# Patient Record
Sex: Male | Born: 1965 | Race: White | Hispanic: No | Marital: Married | Smoking: Never smoker
Health system: Southern US, Community
[De-identification: ages and names within clinical notes are randomized; demographics above are authoritative.]

## PROBLEM LIST (undated history)

## (undated) DIAGNOSIS — I4891 Unspecified atrial fibrillation: Secondary | ICD-10-CM

## (undated) DIAGNOSIS — I639 Cerebral infarction, unspecified: Secondary | ICD-10-CM

## (undated) DIAGNOSIS — R011 Cardiac murmur, unspecified: Secondary | ICD-10-CM

## (undated) DIAGNOSIS — I341 Nonrheumatic mitral (valve) prolapse: Secondary | ICD-10-CM

## (undated) DIAGNOSIS — F411 Generalized anxiety disorder: Secondary | ICD-10-CM

## (undated) DIAGNOSIS — K219 Gastro-esophageal reflux disease without esophagitis: Secondary | ICD-10-CM

## (undated) DIAGNOSIS — E785 Hyperlipidemia, unspecified: Secondary | ICD-10-CM

## (undated) DIAGNOSIS — Z7901 Long term (current) use of anticoagulants: Secondary | ICD-10-CM

## (undated) DIAGNOSIS — U071 COVID-19: Secondary | ICD-10-CM

## (undated) DIAGNOSIS — G43009 Migraine without aura, not intractable, without status migrainosus: Secondary | ICD-10-CM

## (undated) DIAGNOSIS — I517 Cardiomegaly: Secondary | ICD-10-CM

## (undated) DIAGNOSIS — I509 Heart failure, unspecified: Secondary | ICD-10-CM

## (undated) DIAGNOSIS — Z95810 Presence of automatic (implantable) cardiac defibrillator: Secondary | ICD-10-CM

## (undated) DIAGNOSIS — Z9289 Personal history of other medical treatment: Secondary | ICD-10-CM

## (undated) DIAGNOSIS — Z9889 Other specified postprocedural states: Secondary | ICD-10-CM

## (undated) DIAGNOSIS — M1990 Unspecified osteoarthritis, unspecified site: Secondary | ICD-10-CM

## (undated) HISTORY — DX: Cardiomegaly: I51.7

## (undated) HISTORY — DX: Migraine without aura, not intractable, without status migrainosus: G43.009

## (undated) HISTORY — DX: Gastro-esophageal reflux disease without esophagitis: K21.9

## (undated) HISTORY — DX: COVID-19: U07.1

## (undated) HISTORY — DX: Hyperlipidemia, unspecified: E78.5

## (undated) HISTORY — PX: OTHER SURGICAL HISTORY: SHX169

## (undated) HISTORY — DX: Generalized anxiety disorder: F41.1

## (undated) HISTORY — DX: Unspecified atrial fibrillation: I48.91

## (undated) HISTORY — DX: Long term (current) use of anticoagulants: Z79.01

## (undated) HISTORY — DX: Other specified postprocedural states: Z98.890

## (undated) HISTORY — DX: Nonrheumatic mitral (valve) prolapse: I34.1

## (undated) HISTORY — PX: KNEE SURGERY: SHX244

## (undated) HISTORY — DX: Personal history of other medical treatment: Z92.89

## (undated) HISTORY — DX: Cerebral infarction, unspecified: I63.9

---

## 2003-07-04 ENCOUNTER — Emergency Department (HOSPITAL_COMMUNITY): Admission: EM | Admit: 2003-07-04 | Discharge: 2003-07-04 | Payer: Self-pay | Admitting: Emergency Medicine

## 2003-07-04 ENCOUNTER — Encounter: Payer: Self-pay | Admitting: Emergency Medicine

## 2005-08-17 HISTORY — PX: US ECHOCARDIOGRAPHY: HXRAD669

## 2007-10-22 ENCOUNTER — Ambulatory Visit: Payer: Self-pay | Admitting: Family Medicine

## 2007-10-22 DIAGNOSIS — E785 Hyperlipidemia, unspecified: Secondary | ICD-10-CM

## 2007-10-22 DIAGNOSIS — I059 Rheumatic mitral valve disease, unspecified: Secondary | ICD-10-CM | POA: Insufficient documentation

## 2007-10-22 HISTORY — DX: Hyperlipidemia, unspecified: E78.5

## 2007-12-24 ENCOUNTER — Ambulatory Visit: Payer: Self-pay | Admitting: Family Medicine

## 2007-12-24 LAB — CONVERTED CEMR LAB
Blood in Urine, dipstick: NEGATIVE
Glucose, Urine, Semiquant: 100
Ketones, urine, test strip: NEGATIVE
Protein, U semiquant: NEGATIVE
Specific Gravity, Urine: 1.02
Urobilinogen, UA: 0.2
WBC Urine, dipstick: NEGATIVE
pH: 7

## 2007-12-29 LAB — CONVERTED CEMR LAB
ALT: 27 units/L (ref 0–53)
Albumin: 4 g/dL (ref 3.5–5.2)
Alkaline Phosphatase: 60 units/L (ref 39–117)
BUN: 15 mg/dL (ref 6–23)
Basophils Absolute: 0 10*3/uL (ref 0.0–0.1)
Bilirubin, Direct: 0.2 mg/dL (ref 0.0–0.3)
CO2: 28 meq/L (ref 19–32)
Calcium: 9.9 mg/dL (ref 8.4–10.5)
Eosinophils Absolute: 0.1 10*3/uL (ref 0.0–0.6)
Monocytes Absolute: 0.4 10*3/uL (ref 0.2–0.7)
Neutrophils Relative %: 48.2 % (ref 43.0–77.0)
RDW: 12.4 % (ref 11.5–14.6)
Total Bilirubin: 1.2 mg/dL (ref 0.3–1.2)
Total CHOL/HDL Ratio: 2.9
Total Protein: 6.5 g/dL (ref 6.0–8.3)

## 2008-01-06 ENCOUNTER — Ambulatory Visit: Payer: Self-pay | Admitting: Family Medicine

## 2008-01-06 DIAGNOSIS — I4891 Unspecified atrial fibrillation: Secondary | ICD-10-CM

## 2008-09-01 HISTORY — PX: US ECHOCARDIOGRAPHY: HXRAD669

## 2008-11-08 ENCOUNTER — Encounter: Payer: Self-pay | Admitting: Family Medicine

## 2010-09-01 ENCOUNTER — Ambulatory Visit: Payer: Self-pay | Admitting: Cardiovascular Disease

## 2010-09-05 ENCOUNTER — Ambulatory Visit: Payer: Self-pay | Admitting: Cardiovascular Disease

## 2011-09-27 ENCOUNTER — Encounter: Payer: Self-pay | Admitting: Cardiovascular Disease

## 2011-10-04 ENCOUNTER — Encounter: Payer: Self-pay | Admitting: Cardiovascular Disease

## 2011-10-04 ENCOUNTER — Ambulatory Visit (INDEPENDENT_AMBULATORY_CARE_PROVIDER_SITE_OTHER): Payer: Self-pay | Admitting: Cardiovascular Disease

## 2011-10-04 ENCOUNTER — Ambulatory Visit (INDEPENDENT_AMBULATORY_CARE_PROVIDER_SITE_OTHER): Payer: BC Managed Care – PPO | Admitting: *Deleted

## 2011-10-04 VITALS — BP 118/80 | HR 60 | Ht 74.0 in | Wt 220.0 lb

## 2011-10-04 DIAGNOSIS — E785 Hyperlipidemia, unspecified: Secondary | ICD-10-CM

## 2011-10-04 DIAGNOSIS — I4891 Unspecified atrial fibrillation: Secondary | ICD-10-CM

## 2011-10-04 LAB — LIPID PANEL
Cholesterol: 172 mg/dL (ref 0–200)
Total CHOL/HDL Ratio: 2
Triglycerides: 58 mg/dL (ref 0.0–149.0)

## 2011-10-04 LAB — BASIC METABOLIC PANEL
CO2: 27 mEq/L (ref 19–32)
Calcium: 9.2 mg/dL (ref 8.4–10.5)
Creatinine, Ser: 1.2 mg/dL (ref 0.4–1.5)

## 2011-10-04 LAB — HEPATIC FUNCTION PANEL
ALT: 21 U/L (ref 0–53)
AST: 24 U/L (ref 0–37)
Albumin: 4.6 g/dL (ref 3.5–5.2)
Alkaline Phosphatase: 54 U/L (ref 39–117)
Total Protein: 7.2 g/dL (ref 6.0–8.3)

## 2011-10-04 NOTE — Assessment & Plan Note (Signed)
Dennis Cortez is doing fairly well. He remains in atrial fibrillation. For now we'll continue him on aspirin.  We will discussion regarding starting additional agents at this point he is completely asymptomatic and is able to do all of his normal activities without any significant problems.  I told him that we certainly could be more aggressive about and start him on anticoagulants and a cardioversion. He would also likely need to start antiarrhythmic medicines.  At this point I don't think that we can make him feel any better. The antiarrhythmic medicines are likely to make his heart rate go slower. His left ventricular systolic function has been normal. I'll see him again in one year for followup visit, fasting lipid profile, basic metabolic profile, HFP, and EKG.

## 2011-10-04 NOTE — Patient Instructions (Signed)
Your physician wants you to follow-up in: 1 year  You will receive a reminder letter in the mail two months in advance. If you don't receive a letter, please call our office to schedule the follow-up appointment.  Your physician recommends that you continue on your current medications as directed. Please refer to the Current Medication list given to you today.  

## 2011-10-04 NOTE — Progress Notes (Signed)
Dennis Cortez Date of Birth  03/23/1966 [facility] [address]    [address] [place], [place]  [address] [phone]  Fax  [phone]  History of Present Illness:    Current Outpatient Prescriptions on File Prior to Visit  Medication Sig Dispense Refill  . aspirin 325 MG tablet Take 325 mg by mouth daily.        . Cholecalciferol (VITAMIN D PO) Take by mouth daily.        . fish oil-omega-3 fatty acids 1000 MG capsule Take 2 g by mouth daily.        . Glucosamine-Chondroit-Vit C-Mn (GLUCOSAMINE CHONDR 1500 COMPLX PO) Take by mouth 2 (two) times daily.        . Multiple Vitamin (MULTIVITAMIN) tablet Take 1 tablet by mouth daily.        . rosuvastatin (CRESTOR) 10 MG tablet Take 10 mg by mouth daily.          No Known Allergies  Past Medical History  Diagnosis Date  . Atrial fibrillation   . MVP (mitral valve prolapse)   . Hyperlipidemia   . LVH (left ventricular hypertrophy)     Past Surgical History  Procedure Date  . Knee surgery   . US echocardiography 09/01/2008    EF 55-60%  . US echocardiography 08/17/2005    EF 55-60%    History  Smoking status  . Never Smoker   Smokeless tobacco  . Not on file    History  Alcohol Use No    History reviewed. No pertinent family history.  Reviw of Systems:  Reviewed in the HPI.  All other systems are negative.  Physical Exam: BP 118/80  Pulse 60  Ht 6\' 2"  (1.88 m)  Wt 220 lb (99.791 kg)  BMI 28.25 kg/m2 The patient is alert and oriented x 3.  The mood and affect are normal.   Skin: warm and dry.  Color is normal.    HEENT:   Normal carotids, no JVD  Lungs: clear   Heart: Irregular Irregular    Abdomen: + BS, non tender  Extremities:  No C/C/E  Neuro:  Non focal     ECG: Atrial fibrillation with a controlled ventricular response  Assessment / Plan:

## 2011-10-09 ENCOUNTER — Telehealth: Payer: Self-pay | Admitting: *Deleted

## 2011-10-09 NOTE — Progress Notes (Signed)
Mailed copy of labs, left message with wife if any questions to call

## 2011-10-09 NOTE — Telephone Encounter (Signed)
Message copied by Burnell Blanks on Tue Oct 09, 2011  9:27 AM ------      Message from: [place], [place] J      Created: Thu Oct 04, 2011  4:28 PM       These look good

## 2011-10-09 NOTE — Telephone Encounter (Signed)
Mailed copy of labs. Left message with wife if any questions to call

## 2011-10-30 ENCOUNTER — Other Ambulatory Visit: Payer: Self-pay | Admitting: *Deleted

## 2011-10-30 MED ORDER — ROSUVASTATIN CALCIUM 10 MG PO TABS: 10.0000 mg | ORAL_TABLET | Freq: Every day | ORAL | Status: DC

## 2011-10-30 NOTE — Telephone Encounter (Signed)
Fax Received. Refill Completed. Dexton Zwilling Chowoe (M.A)  

## 2012-05-09 ENCOUNTER — Other Ambulatory Visit: Payer: Self-pay | Admitting: *Deleted

## 2012-05-09 MED ORDER — ROSUVASTATIN CALCIUM 10 MG PO TABS: 10.0000 mg | ORAL_TABLET | Freq: Every day | ORAL | Status: DC

## 2012-05-09 NOTE — Telephone Encounter (Signed)
Fax Received. Refill Completed. Dnya Hickle Chowoe (R.M.A)   

## 2012-05-09 NOTE — Telephone Encounter (Signed)
Opened in Error.

## 2012-05-10 ENCOUNTER — Other Ambulatory Visit: Payer: Self-pay | Admitting: *Deleted

## 2012-05-10 MED ORDER — ROSUVASTATIN CALCIUM 10 MG PO TABS: 10.0000 mg | ORAL_TABLET | Freq: Every day | ORAL | Status: DC

## 2012-05-10 NOTE — Telephone Encounter (Signed)
Opened in Error.

## 2012-09-01 ENCOUNTER — Ambulatory Visit (INDEPENDENT_AMBULATORY_CARE_PROVIDER_SITE_OTHER): Payer: BC Managed Care – PPO | Admitting: Family Medicine

## 2012-09-01 ENCOUNTER — Encounter: Payer: Self-pay | Admitting: Family Medicine

## 2012-09-01 VITALS — BP 110/70 | Temp 98.9°F | Ht 74.0 in | Wt 218.0 lb

## 2012-09-01 DIAGNOSIS — E785 Hyperlipidemia, unspecified: Secondary | ICD-10-CM

## 2012-09-01 DIAGNOSIS — I4891 Unspecified atrial fibrillation: Secondary | ICD-10-CM

## 2012-09-01 NOTE — Progress Notes (Addendum)
Chief Complaint  Patient presents with  . Establish Care    HPI:  Mr. Zajkowski is here to establish care. He is follow by cardiology for his atrial fib and lipids.   Atrial. Fib: -followed by cards -on ASA daily -denies: CP, SOB, palpitations, dizziness  Hyperlipidemia: -doing well on statin -just had labs checked - he will send [place] a copy of these -tries to eat healthy -exercising, but has difficulty with cardio due to knee and shoulder issues -denies leg cramping  Preventive: Alcohol Use: -drinks about 12 beers on the weekend -no drinking during the week -adopted - so had colonoscopy in the past and normal -UTD on tetanus -Had PSA in the past several times and normal -does not want flu vaccine  Addendum: patient faxed lab results from recent health [facility]. Total chol 164 HDL 75 Trig 39 LDL 78 Fasting Glu 80   ROS: See pertinent positives and negatives per HPI.  Past Medical History  Diagnosis Date  . Atrial fibrillation   . MVP (mitral valve prolapse)   . Hyperlipidemia   . LVH (left ventricular hypertrophy)     No family history on file.  History   Social History  . Marital Status: Married    Spouse Name: N/A    Number of Children: N/A  . Years of Education: N/A   Social History Main Topics  . Smoking status: Never Smoker   . Smokeless tobacco: None  . Alcohol Use: No  . Drug Use: No  . Sexually Active:    Other Topics Concern  . None   Social History Narrative  . None    Current outpatient prescriptions:aspirin 325 MG tablet, Take 325 mg by mouth daily.  , Disp: , Rfl: ;  Cholecalciferol (VITAMIN D PO), Take by mouth daily.  , Disp: , Rfl: ;  fish oil-omega-3 fatty acids 1000 MG capsule, Take 2 g by mouth daily.  , Disp: , Rfl: ;  Glucosamine-Chondroit-Vit C-Mn (GLUCOSAMINE CHONDR 1500 COMPLX PO), Take by mouth 2 (two) times daily.  , Disp: , Rfl:  Multiple Vitamin (MULTIVITAMIN) tablet, Take 1 tablet by mouth daily.  , Disp: , Rfl: ;   rosuvastatin (CRESTOR) 10 MG tablet, Take 1 tablet (10 mg total) by mouth daily., Disp: 30 tablet, Rfl: 5  EXAM:  Filed Vitals:   09/01/12 0802  BP: 110/70  Temp: 98.9 F (37.2 C)    Body mass index is 27.99 kg/(m^2).  GENERAL: vitals reviewed and listed below, alert, oriented, appears well hydrated and in no acute distress  HEENT: atraumatic, conjucntiva clear, no obvious abnormalities on inspection of external nose and ears  NECK: no masses on inspection  LUNGS: clear to auscultation bilaterally, no wheezes, rales or rhonchi, good air movement  CV: irr irr, no peripheral edema  MS: moves all extremities without noticeable abnormality  PSYCH: pleasant and cooperative, no obvious depression or anxiety  ASSESSMENT AND PLAN:  Discussed the following assessment and plan:   Preventive 1. HYPERLIPIDEMIA   2. ATRIAL FIBRILLATION WITH SLOW VENTRICULAR RESPONSE    -offered flu - refused -will review labs once received -discussed UPSTF preventive recommendations for age. -advised to cut back on drinking and counseling provided -advised healthy diet and regular low impact exercise such as biking, ellipticle, swimming -follow up yearly for physical or in meantime if any concerns  No orders of the defined types were placed in this encounter.    There are no Patient Instructions on file for this visit.  Return to  clinic immediately if symptoms worsen or persist or new concerns.  No Follow-up on file.  Kriste Basque R.

## 2012-09-04 ENCOUNTER — Other Ambulatory Visit (INDEPENDENT_AMBULATORY_CARE_PROVIDER_SITE_OTHER): Payer: BC Managed Care – PPO

## 2012-09-04 DIAGNOSIS — E785 Hyperlipidemia, unspecified: Secondary | ICD-10-CM

## 2012-09-04 LAB — BASIC METABOLIC PANEL
BUN: 15 mg/dL (ref 6–23)
Chloride: 104 mEq/L (ref 96–112)
GFR: 83.92 mL/min (ref >60.00)
Glucose, Bld: 89 mg/dL (ref 70–99)
Potassium: 4.5 mEq/L (ref 3.5–5.1)
Sodium: 137 mEq/L (ref 135–145)

## 2012-09-04 LAB — HEPATIC FUNCTION PANEL
ALT: 28 U/L (ref 0–53)
AST: 27 U/L (ref 0–37)
Albumin: 4.2 g/dL (ref 3.5–5.2)
Alkaline Phosphatase: 49 U/L (ref 39–117)

## 2012-09-04 LAB — LIPID PANEL
Cholesterol: 165 mg/dL (ref 0–200)
VLDL: 9.8 mg/dL (ref 0.0–40.0)

## 2012-09-11 ENCOUNTER — Encounter: Payer: Self-pay | Admitting: Cardiovascular Disease

## 2012-09-11 ENCOUNTER — Ambulatory Visit (INDEPENDENT_AMBULATORY_CARE_PROVIDER_SITE_OTHER): Payer: BC Managed Care – PPO | Admitting: Cardiovascular Disease

## 2012-09-11 VITALS — BP 120/78 | HR 63 | Ht 74.0 in | Wt 214.4 lb

## 2012-09-11 DIAGNOSIS — I4891 Unspecified atrial fibrillation: Secondary | ICD-10-CM

## 2012-09-11 DIAGNOSIS — E785 Hyperlipidemia, unspecified: Secondary | ICD-10-CM

## 2012-09-11 MED ORDER — ROSUVASTATIN CALCIUM 10 MG PO TABS: 10.0000 mg | ORAL_TABLET | Freq: Every day | ORAL | Status: DC

## 2012-09-11 NOTE — Patient Instructions (Addendum)
Your physician wants you to follow-up in: 1 year  You will receive a reminder letter in the mail two months in advance. If you don't receive a letter, please call our office to schedule the follow-up appointment.   Your physician recommends that you return for a FASTING lipid profile: 1 year   

## 2012-09-11 NOTE — Progress Notes (Signed)
    Dennis Cortez Date of Birth  Feb 11, 1966       [facility]    [facility] [address]  [address] [place], [place]  [address]   [place], [place]  [address] [phone]     [phone]   Fax  [phone]    Fax [phone]  Problem List: 1. Atrial Fibrillation 2. Hyperlipidemia  History of Present Illness:  Dennis Cortez is doing well.  Asymptomatic.  His last echo was 3 years ago.  He is working out at [employer] on a regular basis.  He has never had any symptoms related to his A-Fib.  His CHADS score is 0  Current Outpatient Prescriptions on File Prior to Visit  Medication Sig Dispense Refill  . aspirin 325 MG tablet Take 325 mg by mouth daily.        . fish oil-omega-3 fatty acids 1000 MG capsule Take 2 g by mouth daily.        . Glucosamine-Chondroit-Vit C-Mn (GLUCOSAMINE CHONDR 1500 COMPLX PO) Take by mouth 2 (two) times daily.        . Multiple Vitamin (MULTIVITAMIN) tablet Take 1 tablet by mouth daily.        . rosuvastatin (CRESTOR) 10 MG tablet Take 1 tablet (10 mg total) by mouth daily.  30 tablet  5    No Known Allergies  Past Medical History  Diagnosis Date  . Atrial fibrillation   . MVP (mitral valve prolapse)   . Hyperlipidemia   . LVH (left ventricular hypertrophy)     Past Surgical History  Procedure Date  . Knee surgery   . US echocardiography 09/01/2008    EF 55-60%  . US echocardiography 08/17/2005    EF 55-60%    History  Smoking status  . Never Smoker   Smokeless tobacco  . Not on file    History  Alcohol Use No    No family history on file.  Reviw of Systems:  Reviewed in the HPI.  All other systems are negative.  Physical Exam: Blood pressure 120/78, pulse 63, height 6\' 2"  (1.88 m), weight 214 lb 6.4 oz (97.251 kg). General: Well developed, well nourished, in no acute distress.  Head: Normocephalic, atraumatic, sclera non-icteric, mucus membranes are moist,   Neck: Supple. Carotids are 2 +  without bruits. No JVD  Lungs: Clear bilaterally to auscultation.  Heart: irregularly irregular.  normal  S1 S2. No significant  murmurs, gallops or rubs.  Abdomen: Soft, non-tender, non-distended with normal bowel sounds. No hepatomegaly. No rebound/guarding. No masses.  Msk:  Strength and tone are normal  Extremities: No clubbing or cyanosis. No edema.  Distal pedal pulses are 2+ and equal bilaterally.  Neuro: Alert and oriented X 3. Moves all extremities spontaneously.  Psych:  Responds to questions appropriately with a normal affect.  ECG: Oct. 3, 2013- Atrial fibrillation at 54.  NS ST T wave changes  Assessment / Plan:

## 2012-09-11 NOTE — Assessment & Plan Note (Signed)
Dennis Cortez is doing very well.  He continues to be asymptomatic.  He has never had any cp or dyspnea. Will see him in a year.  I anticipate doing an echo next year.  He has a hx of MVP but has not had any symptoms and does not have a loud murmur.

## 2012-09-11 NOTE — Assessment & Plan Note (Signed)
His lipid levels have been well controlled.  Continue current dose of crestor.  Will check fasting labs again in 1 year at his ov.

## 2012-09-16 ENCOUNTER — Encounter: Payer: Self-pay | Admitting: Cardiovascular Disease

## 2012-11-03 ENCOUNTER — Other Ambulatory Visit: Payer: Self-pay | Admitting: *Deleted

## 2012-11-03 NOTE — Telephone Encounter (Signed)
Opened in Error.

## 2012-11-05 ENCOUNTER — Other Ambulatory Visit: Payer: Self-pay | Admitting: Cardiovascular Disease

## 2012-11-05 DIAGNOSIS — E785 Hyperlipidemia, unspecified: Secondary | ICD-10-CM

## 2012-11-05 MED ORDER — ROSUVASTATIN CALCIUM 10 MG PO TABS: 10.0000 mg | ORAL_TABLET | Freq: Every day | ORAL | Status: DC

## 2012-12-15 ENCOUNTER — Telehealth: Payer: Self-pay | Admitting: Family Medicine

## 2012-12-15 DIAGNOSIS — E785 Hyperlipidemia, unspecified: Secondary | ICD-10-CM

## 2012-12-15 MED ORDER — ROSUVASTATIN CALCIUM 10 MG PO TABS: 10.0000 mg | ORAL_TABLET | Freq: Every day | ORAL | Status: DC

## 2012-12-15 NOTE — Telephone Encounter (Signed)
Rx sent to pharmacy   

## 2012-12-15 NOTE — Telephone Encounter (Signed)
Patient's spouse called stating that they are changing to 90 day refills per insurance and will need to have crestor 10 1poqd called [facility] [address] for 90 day refill. Please assist.

## 2013-02-07 DIAGNOSIS — Z9289 Personal history of other medical treatment: Secondary | ICD-10-CM

## 2013-02-07 HISTORY — DX: Personal history of other medical treatment: Z92.89

## 2013-02-17 ENCOUNTER — Encounter: Payer: Self-pay | Admitting: Physician Assistant

## 2013-02-17 ENCOUNTER — Ambulatory Visit (INDEPENDENT_AMBULATORY_CARE_PROVIDER_SITE_OTHER): Payer: Managed Care, Other (non HMO) | Admitting: Physician Assistant

## 2013-02-17 ENCOUNTER — Telehealth: Payer: Self-pay | Admitting: *Deleted

## 2013-02-17 VITALS — BP 106/82 | HR 56 | Ht 74.0 in | Wt 224.0 lb

## 2013-02-17 DIAGNOSIS — R5381 Other malaise: Secondary | ICD-10-CM

## 2013-02-17 DIAGNOSIS — R079 Chest pain, unspecified: Secondary | ICD-10-CM

## 2013-02-17 DIAGNOSIS — K219 Gastro-esophageal reflux disease without esophagitis: Secondary | ICD-10-CM

## 2013-02-17 DIAGNOSIS — R0602 Shortness of breath: Secondary | ICD-10-CM

## 2013-02-17 DIAGNOSIS — I4891 Unspecified atrial fibrillation: Secondary | ICD-10-CM

## 2013-02-17 DIAGNOSIS — R5383 Other fatigue: Secondary | ICD-10-CM

## 2013-02-17 MED ORDER — OMEPRAZOLE 20 MG PO CPDR: 20.0000 mg | DELAYED_RELEASE_CAPSULE | Freq: Every day | ORAL | Status: DC

## 2013-02-17 NOTE — Progress Notes (Signed)
[address], [address] [place], [place]  [address] Phone: [phone], Fax:  [phone]  Date:  02/17/2013   ID:  Dennis Cortez, DOB May 20, 1966, MRN [id number]  PCP:  Terressa Koyanagi., DO  Primary Cardiologist:  Dr. Delane Ginger     History of Present Illness: Ladonte Verstraete is a 47 y.o. male who returns for evaluation of chest pain.  He has a history of atrial fibrillation, HL, mitral valve prolapse. CHADS2=0.  He is on ASA only.  Echo 9/09: EF 55-60%, mild LVH, mild BAE. ETT-echo 9/09: Normal. Last seen by Dr. Elease Hashimoto 10/13.  Plan was to get a followup echo at his next visit. Over the last couple of months, he has noted progressively worsening left-sided chest discomfort that he describes as sharp. This typically occurs with stress. He has decreased his exercise over the last year. However, when he exercises on the weekend, he denies exertional chest pain. He denies significant dyspnea. He does feel like he is breathing harder when he has chest pain. He denies syncope. He denies associated nausea, diaphoresis, jaw pain. He denies orthopnea, PND or edema. He does have a history of shoulder surgery on the left. His left shoulder and arm has been bothering him more. This is not clearly associated with his chest pain.  Wt Readings from Last 3 Encounters:  02/17/13 224 lb (101.606 kg)  09/11/12 214 lb 6.4 oz (97.251 kg)  09/01/12 218 lb (98.884 kg)     Past Medical History  Diagnosis Date  . Atrial fibrillation   . MVP (mitral valve prolapse)   . Hyperlipidemia   . LVH (left ventricular hypertrophy)     Current Outpatient Prescriptions  Medication Sig Dispense Refill  . aspirin 325 MG tablet Take 325 mg by mouth daily.        . fish oil-omega-3 fatty acids 1000 MG capsule Take 2 g by mouth daily.        . Glucosamine-Chondroit-Vit C-Mn (GLUCOSAMINE CHONDR 1500 COMPLX PO) Take by mouth 2 (two) times daily.        . Multiple Vitamin (MULTIVITAMIN) tablet Take 1 tablet by  mouth daily.        . Probiotic Product (PROBIOTIC DAILY PO) Take by mouth.      . rosuvastatin (CRESTOR) 10 MG tablet Take 1 tablet (10 mg total) by mouth daily.  90 tablet  1   No current facility-administered medications for this visit.    Allergies:   No Known Allergies  Social History:  The patient  reports that he has never smoked. He does not have any smokeless tobacco history on file. He reports that he does not drink alcohol or use illicit drugs.   ROS:  Please see the history of present illness.   He does have daily indigestion. He denies chest pain associated with meals. He does note some mild dysphagia. He denies melena or significant hematochezia. He has a nonproductive cough.   All other systems reviewed and negative.   PHYSICAL EXAM: VS:  BP 106/82  Pulse 56  Ht 6\' 2"  (1.88 m)  Wt 224 lb (101.606 kg)  BMI 28.75 kg/m2 Well nourished, well developed, in no acute distress HEENT: normal Neck: no JVD Vascular: No carotid bruits Cardiac:  normal S1, S2; irregularly irregular rhythm; no murmur Lungs:  clear to auscultation bilaterally, no wheezing, rhonchi or rales Abd: soft, nontender, no hepatomegaly Ext: no edema Skin: warm and dry MSK: Mild pain with internal rotation of left upper extremity;  no crepitus noted in the left shoulder Neuro:  CNs 2-12 intact, no focal abnormalities noted  EKG:  Atrial fibrillation, HR 56, normal axis, poor R wave progression, diffuse ST abnormality, no significant change when compared to prior tracings     ASSESSMENT AND PLAN:  1. Chest Pain:  Etiology unclear. His symptom complex is likely multifactorial and related to a combination of GERD, emotional stress, left shoulder arthropathy and deconditioning. I will place him on Prilosec 20 mg daily. He should follow up with his primary care physician. If his dysphagia continues, he should seek referral to gastroenterology. Check a CBC, basic metabolic panel and TSH. Proceed with follow up  echocardiogram as previously planned as well as an ETT-echocardiogram. If his stress test is normal, he should increase activity to his previous level. 2. Atrial Fibrillation: Rate controlled. CHADS2 score 0 he continues on aspirin. 3. Hyperlipidemia: Continue statin. 4. GERD: Start Prilosec and follow up with PCP. 5. Disposition:  Follow up with Dr. Elease Hashimoto in 6 weeks.  Sloane, Junkin, PA-C  3:29 PM 02/17/2013

## 2013-02-17 NOTE — Telephone Encounter (Signed)
Called patient who c/o SOB with exertion; L arm pain that radiates to neck; s/s off and on x 1 month; no worsening with exercise; denies n/v, diaphoresis; hx chronic afib; no vitals taken recently; appointment scheduled with Tereso Newcomer for 2:40 today.

## 2013-02-17 NOTE — Telephone Encounter (Signed)
Pt wants to be seen today, chest pain, shoulder pain, no energy about 2 months, getting a little worse

## 2013-02-17 NOTE — Patient Instructions (Addendum)
START PRILOSEC 20 MG DAILY MAKE SURE TO FOLLOW UP UP WITH YOUR PRIMARY CARE PHYSICIAN ABOUT YOUR GERD  LAB TODAY; BMET. CBC W/DIFF, TSH  Your physician has requested that you have an echocardiogram. Echocardiography is a painless test that uses sound waves to create images of your heart. It provides your doctor with information about the size and shape of your heart and how well your heart's chambers and valves are working. This procedure takes approximately one hour. There are no restrictions for this procedure.  Your physician has requested that you have a stress echocardiogram. For further information please visit [email or web address] Please follow instruction sheet as given.  PLEASE FOLLOW UP WITH DR. Elease Hashimoto IN THE NEXT 4-6 WEEKS

## 2013-02-18 ENCOUNTER — Telehealth: Payer: Self-pay | Admitting: *Deleted

## 2013-02-18 LAB — CBC WITH DIFFERENTIAL/PLATELET
Basophils Relative: 0.7 % (ref 0.0–3.0)
Eosinophils Relative: 3 % (ref 0.0–5.0)
HCT: 42.4 % (ref 39.0–52.0)
Hemoglobin: 14.3 g/dL (ref 13.0–17.0)
Lymphs Abs: 2 10*3/uL (ref 0.7–4.0)
MCV: 95.6 fl (ref 78.0–100.0)
Monocytes Absolute: 0.4 10*3/uL (ref 0.1–1.0)
Neutrophils Relative %: 54.4 % (ref 43.0–77.0)
RBC: 4.43 Mil/uL (ref 4.22–5.81)
WBC: 5.7 10*3/uL (ref 4.5–10.5)

## 2013-02-18 LAB — BASIC METABOLIC PANEL
Chloride: 104 mEq/L (ref 96–112)
Potassium: 3.7 mEq/L (ref 3.5–5.1)

## 2013-02-18 NOTE — Telephone Encounter (Signed)
pt's wife notified about lab results with verbal understanding 

## 2013-02-18 NOTE — Telephone Encounter (Signed)
Message copied by Tarri Fuller on Wed Feb 18, 2013  4:24 PM ------      Message from: [place], [place] T      Created: Wed Feb 18, 2013  2:03 PM       All labs ok.      Continue with current treatment plan.      Tereso Newcomer, PA-C  2:03 PM 02/18/2013 ------

## 2013-02-23 ENCOUNTER — Encounter: Payer: Self-pay | Admitting: Physician Assistant

## 2013-02-23 ENCOUNTER — Ambulatory Visit (HOSPITAL_COMMUNITY): Payer: Managed Care, Other (non HMO) | Attending: Cardiology | Admitting: Radiology

## 2013-02-23 ENCOUNTER — Ambulatory Visit (HOSPITAL_COMMUNITY): Payer: Managed Care, Other (non HMO) | Attending: Cardiology

## 2013-02-23 DIAGNOSIS — I4891 Unspecified atrial fibrillation: Secondary | ICD-10-CM | POA: Insufficient documentation

## 2013-02-23 DIAGNOSIS — R9431 Abnormal electrocardiogram [ECG] [EKG]: Secondary | ICD-10-CM | POA: Insufficient documentation

## 2013-02-23 DIAGNOSIS — R0602 Shortness of breath: Secondary | ICD-10-CM

## 2013-02-23 DIAGNOSIS — R5383 Other fatigue: Secondary | ICD-10-CM

## 2013-02-23 NOTE — Progress Notes (Signed)
Echocardiogram performed.  

## 2013-02-24 ENCOUNTER — Ambulatory Visit (HOSPITAL_COMMUNITY): Payer: Managed Care, Other (non HMO) | Attending: Cardiology

## 2013-02-24 ENCOUNTER — Other Ambulatory Visit: Payer: Self-pay

## 2013-02-24 ENCOUNTER — Telehealth: Payer: Self-pay | Admitting: Physician Assistant

## 2013-02-24 DIAGNOSIS — I379 Nonrheumatic pulmonary valve disorder, unspecified: Secondary | ICD-10-CM | POA: Insufficient documentation

## 2013-02-24 DIAGNOSIS — R079 Chest pain, unspecified: Secondary | ICD-10-CM

## 2013-02-24 DIAGNOSIS — R072 Precordial pain: Secondary | ICD-10-CM | POA: Insufficient documentation

## 2013-02-24 DIAGNOSIS — R0989 Other specified symptoms and signs involving the circulatory and respiratory systems: Secondary | ICD-10-CM | POA: Insufficient documentation

## 2013-02-24 DIAGNOSIS — I059 Rheumatic mitral valve disease, unspecified: Secondary | ICD-10-CM | POA: Insufficient documentation

## 2013-02-24 DIAGNOSIS — I079 Rheumatic tricuspid valve disease, unspecified: Secondary | ICD-10-CM | POA: Insufficient documentation

## 2013-02-24 DIAGNOSIS — R5383 Other fatigue: Secondary | ICD-10-CM

## 2013-02-24 DIAGNOSIS — R0609 Other forms of dyspnea: Secondary | ICD-10-CM | POA: Insufficient documentation

## 2013-02-24 DIAGNOSIS — R0602 Shortness of breath: Secondary | ICD-10-CM

## 2013-02-24 NOTE — Telephone Encounter (Signed)
PT RT CALL TO [facility] RE ECHO RESULTS, PLS CALL [phone]

## 2013-02-24 NOTE — Telephone Encounter (Signed)
Will forward to [employer]. PA

## 2013-02-24 NOTE — Progress Notes (Signed)
Echocardiogram performed.  

## 2013-02-25 ENCOUNTER — Encounter: Payer: Self-pay | Admitting: Physician Assistant

## 2013-02-25 ENCOUNTER — Telehealth: Payer: Self-pay | Admitting: *Deleted

## 2013-02-25 NOTE — Telephone Encounter (Signed)
lmom echo normal

## 2013-02-25 NOTE — Telephone Encounter (Signed)
Message copied by Tarri Fuller on Wed Feb 25, 2013 10:35 AM ------      Message from: [place], [place] T      Created: Wed Feb 25, 2013  8:50 AM       Echo ok with      Normal LV function.      Tereso Newcomer, PA-C 02/25/2013 8:50 AM ------

## 2013-02-26 NOTE — Telephone Encounter (Signed)
Discussed results of ETT-echo with Loren Racer, PA-C  8:37 AM 02/26/2013

## 2013-02-27 ENCOUNTER — Ambulatory Visit: Payer: Managed Care, Other (non HMO) | Admitting: Physician Assistant

## 2013-02-27 ENCOUNTER — Encounter: Payer: Self-pay | Admitting: Physician Assistant

## 2013-03-04 ENCOUNTER — Encounter: Payer: Self-pay | Admitting: Physician Assistant

## 2013-03-04 ENCOUNTER — Ambulatory Visit (INDEPENDENT_AMBULATORY_CARE_PROVIDER_SITE_OTHER): Payer: Managed Care, Other (non HMO) | Admitting: Physician Assistant

## 2013-03-04 ENCOUNTER — Ambulatory Visit
Admission: RE | Admit: 2013-03-04 | Discharge: 2013-03-04 | Disposition: A | Discharge status: Home / Self Care | Payer: Managed Care, Other (non HMO) | Source: Ambulatory Visit | Attending: Physician Assistant | Admitting: Physician Assistant

## 2013-03-04 VITALS — BP 100/82 | HR 54 | Ht 74.0 in | Wt 222.4 lb

## 2013-03-04 DIAGNOSIS — Z01810 Encounter for preprocedural cardiovascular examination: Secondary | ICD-10-CM

## 2013-03-04 DIAGNOSIS — R9439 Abnormal result of other cardiovascular function study: Secondary | ICD-10-CM

## 2013-03-04 DIAGNOSIS — I4891 Unspecified atrial fibrillation: Secondary | ICD-10-CM

## 2013-03-04 DIAGNOSIS — R079 Chest pain, unspecified: Secondary | ICD-10-CM

## 2013-03-04 LAB — CBC WITH DIFFERENTIAL/PLATELET
Basophils Absolute: 0 10*3/uL (ref 0.0–0.1)
Eosinophils Absolute: 0.1 10*3/uL (ref 0.0–0.7)
Hemoglobin: 14.5 g/dL (ref 13.0–17.0)
Lymphocytes Relative: 38.3 % (ref 12.0–46.0)
MCHC: 34 g/dL (ref 30.0–36.0)
Monocytes Relative: 10.7 % (ref 3.0–12.0)
Neutro Abs: 2.1 10*3/uL (ref 1.4–7.7)
Neutrophils Relative %: 48.1 % (ref 43.0–77.0)
RBC: 4.47 Mil/uL (ref 4.22–5.81)
RDW: 13.5 % (ref 11.5–14.6)

## 2013-03-04 LAB — BASIC METABOLIC PANEL
BUN: 12 mg/dL (ref 6–23)
Chloride: 102 mEq/L (ref 96–112)
GFR: 82.8 mL/min (ref >60.00)
Potassium: 4.3 mEq/L (ref 3.5–5.1)

## 2013-03-04 LAB — PROTIME-INR
INR: 1 ratio (ref 0.8–1.0)
Prothrombin Time: 11 s (ref 10.2–12.4)

## 2013-03-04 NOTE — Patient Instructions (Addendum)
A chest x-ray takes a picture of the organs and structures inside the chest, including the heart, lungs, and blood vessels. This test can show several things, including, whether the heart is enlarges; whether fluid is building up in the lungs; and whether pacemaker / defibrillator leads are still in place.  Your physician recommends that you return for lab work in: today  Your physician recommends that you schedule a follow-up appointment in: after cath

## 2013-03-04 NOTE — Progress Notes (Signed)
[address] [place], [place]  [address] Phone: [phone], Fax:  [phone]  Date:  03/04/2013   ID:  Dennis Cortez, DOB 07-Mar-1966, MRN [id number]  PCP:  Terressa Koyanagi., DO  Primary Cardiologist:  Dr. Delane Ginger     History of Present Illness: Dennis Cortez is a 47 y.o. male who returns for f/u on chest pain.  He has a hx of atrial fibrillation, HL, mitral valve prolapse. CHADS2=0.  He is on ASA only.  Echo 9/09: EF 55-60%, mild LVH, mild BAE. ETT-echo 9/09: Normal.  I saw him recently with progressively worsening left-sided chest discomfort that he described as sharp that would typically occur with stress. He noted decreased exercise over the last year. I set him up for an ETT-Echo.  The images were normal but he had significant ST depression with exercise and a hypotensive response to exercise.  I reviewed this with Dr. Delane Ginger and we opted to arrange elective LHC to further define his anatomy.  He has not had a change in his symptoms since last seen.  No worsening CP.  Still notes DOE with some activities.    Labs (3/14):  K 3.7, Cr 1.3, Hgb 14.3, TSH 2.39   Wt Readings from Last 3 Encounters:  03/04/13 222 lb 6.4 oz (100.88 kg)  02/17/13 224 lb (101.606 kg)  09/11/12 214 lb 6.4 oz (97.251 kg)     Past Medical History  Diagnosis Date  . Atrial fibrillation   . MVP (mitral valve prolapse)   . Hyperlipidemia   . LVH (left ventricular hypertrophy)     a. Echo 02/2013:  Mild LVH, EF 55-65%, normal wall motion, mild LAE, mild RVE, mild RAE   . H/O exercise stress test 02/2013    a. ETT-Echo with normal images but significant ST depression and hypotensive response to exercise    Current Outpatient Prescriptions  Medication Sig Dispense Refill  . aspirin 325 MG tablet Take 325 mg by mouth daily.        . fish oil-omega-3 fatty acids 1000 MG capsule Take 2 g by mouth daily.        . Glucosamine-Chondroit-Vit C-Mn (GLUCOSAMINE CHONDR 1500 COMPLX PO)  Take by mouth 2 (two) times daily.        . Multiple Vitamin (MULTIVITAMIN) tablet Take 1 tablet by mouth daily.        Marland Kitchen omeprazole (PRILOSEC) 20 MG capsule Take 1 capsule (20 mg total) by mouth daily.  30 capsule  1  . Probiotic Product (PROBIOTIC DAILY PO) Take by mouth.      . rosuvastatin (CRESTOR) 10 MG tablet Take 1 tablet (10 mg total) by mouth daily.  90 tablet  1   No current facility-administered medications for this visit.    Allergies:   No Known Allergies  Social History:  The patient  reports that he has never smoked. He does not have any smokeless tobacco history on file. He reports that he does not drink alcohol or use illicit drugs.   Family History:  The patient's family history is not on file. He is adopted.   ROS:  Please see the history of present illness.     All other systems reviewed and negative.   PHYSICAL EXAM: VS:  BP 100/82  Pulse 54  Ht 6\' 2"  (1.88 m)  Wt 222 lb 6.4 oz (100.88 kg)  BMI 28.54 kg/m2 Well nourished, well developed, in no acute distress HEENT: normal Neck: no JVD Vascular:  No carotid bruits Cardiac:  normal S1, S2; irregularly irregular rhythm; no murmur Lungs:  clear to auscultation bilaterally, no wheezing, rhonchi or rales Abd: soft, nontender, no hepatomegaly Ext: no edema Skin: warm and dry Neuro:  CNs 2-12 intact, no focal abnormalities noted  EKG:  Atrial fibrillation, HR 54     ASSESSMENT AND PLAN:  1. Chest Pain:  As noted, his ETT-Echo was abnormal.  I reviewed this with Dr. Delane Ginger and the patient has been set up for a cardiac catheterization.  Risks and benefits of cardiac catheterization have been discussed with the patient.  These include bleeding, infection, kidney damage, stroke, heart attack, death.  The patient understands these risks and is willing to proceed.  2. Atrial Fibrillation: Rate controlled. CHADS2=0. He continues on aspirin. 3. Hyperlipidemia: Continue statin. 4. Disposition:  Proceed with cardiac  cath as noted.  Deshone, Lyssy, PA-C  10:25 AM 03/04/2013

## 2013-03-04 NOTE — H&P (Signed)
History and Physical  Date:  03/04/2013   ID:  Dennis Cortez, DOB [birth year], MRN [id number]  PCP:  Terressa Koyanagi., DO  Primary Cardiologist:  Dr. Delane Ginger     History of Present Illness: Dennis Cortez is a 47 y.o. male who returns for f/u on chest pain.  He has a hx of atrial fibrillation, HL, mitral valve prolapse. CHADS2=0.  He is on ASA only.  Echo 9/09: EF 55-60%, mild LVH, mild BAE. ETT-echo 9/09: Normal.  I saw him recently with progressively worsening left-sided chest discomfort that he described as sharp that would typically occur with stress. He noted decreased exercise over the last year. I set him up for an ETT-Echo.  The images were normal but he had significant ST depression with exercise and a hypotensive response to exercise.  I reviewed this with Dr. Delane Ginger and we opted to arrange elective LHC to further define his anatomy.  He has not had a change in his symptoms since last seen.  No worsening CP.  Still notes DOE with some activities.    Labs (3/14):  K 3.7, Cr 1.3, Hgb 14.3, TSH 2.39   Wt Readings from Last 3 Encounters:  03/04/13 222 lb 6.4 oz (100.88 kg)  02/17/13 224 lb (101.606 kg)  09/11/12 214 lb 6.4 oz (97.251 kg)     Past Medical History  Diagnosis Date  . Atrial fibrillation   . MVP (mitral valve prolapse)   . Hyperlipidemia   . LVH (left ventricular hypertrophy)     a. Echo 02/2013:  Mild LVH, EF 55-65%, normal wall motion, mild LAE, mild RVE, mild RAE   . H/O exercise stress test 02/2013    a. ETT-Echo with normal images but significant ST depression and hypotensive response to exercise    Current Outpatient Prescriptions  Medication Sig Dispense Refill  . aspirin 325 MG tablet Take 325 mg by mouth daily.        . fish oil-omega-3 fatty acids 1000 MG capsule Take 2 g by mouth daily.        . Glucosamine-Chondroit-Vit C-Mn (GLUCOSAMINE CHONDR 1500 COMPLX PO) Take by mouth 2 (two) times daily.        . Multiple Vitamin (MULTIVITAMIN) tablet Take 1  tablet by mouth daily.        Marland Kitchen omeprazole (PRILOSEC) 20 MG capsule Take 1 capsule (20 mg total) by mouth daily.  30 capsule  1  . Probiotic Product (PROBIOTIC DAILY PO) Take by mouth.      . rosuvastatin (CRESTOR) 10 MG tablet Take 1 tablet (10 mg total) by mouth daily.  90 tablet  1   No current facility-administered medications for this visit.    Allergies:   No Known Allergies  Social History:  The patient  reports that he has never smoked. He does not have any smokeless tobacco history on file. He reports that he does not drink alcohol or use illicit drugs.   Family History:  The patient's family history is not on file. He is adopted.   ROS:  Please see the history of present illness.     All other systems reviewed and negative.   PHYSICAL EXAM: VS:  BP 100/82  Pulse 54  Ht 6\' 2"  (1.88 m)  Wt 222 lb 6.4 oz (100.88 kg)  BMI 28.54 kg/m2 Well nourished, well developed, in no acute distress HEENT: normal Neck: no JVD Vascular: No carotid bruits Cardiac:  normal S1, S2; irregularly irregular rhythm; no murmur Lungs:  clear to auscultation bilaterally, no wheezing, rhonchi or rales Abd: soft, nontender, no hepatomegaly Ext: no edema Skin: warm and dry Neuro:  CNs 2-12 intact, no focal abnormalities noted  EKG:  Atrial fibrillation, HR 54     ASSESSMENT AND PLAN:  1. Chest Pain:  As noted, his ETT-Echo was abnormal.  I reviewed this with Dr. Delane Ginger and the patient has been set up for a cardiac catheterization.  Risks and benefits of cardiac catheterization have been discussed with the patient.  These include bleeding, infection, kidney damage, stroke, heart attack, death.  The patient understands these risks and is willing to proceed.  2. Atrial Fibrillation: Rate controlled. CHADS2=0. He continues on aspirin. 3. Hyperlipidemia: Continue statin. 4. Disposition:  Proceed with cardiac cath as noted.  Skylan, Lara, PA-C  10:25 AM 03/04/2013

## 2013-03-05 NOTE — H&P (Signed)
[address], [address]  [place], [place] [address]  Phone: [phone], Fax: [phone]  Date: 03/04/2013  ID: Dennis Cortez, DOB 07-11-1966, MRN [id number]  PCP: Terressa Koyanagi., DO  Primary Cardiologist: Dr. Delane Ginger  History of Present Illness:  Dennis Cortez is a 47 y.o. male who returns for f/u on chest pain. He has a hx of atrial fibrillation, HL, mitral valve prolapse. CHADS2=0. He is on ASA only. Echo 9/09: EF 55-60%, mild LVH, mild BAE. ETT-echo 9/09: Normal. I saw him recently with progressively worsening left-sided chest discomfort that he described as sharp that would typically occur with stress. He noted decreased exercise over the last year. I set him up for an ETT-Echo. The images were normal but he had significant ST depression with exercise and a hypotensive response to exercise. I reviewed this with Dr. Delane Ginger and we opted to arrange elective LHC to further define his anatomy. He has not had a change in his symptoms since last seen. No worsening CP. Still notes DOE with some activities.  Labs (3/14): K 3.7, Cr 1.3, Hgb 14.3, TSH 2.39  Wt Readings from Last 3 Encounters:   03/04/13  222 lb 6.4 oz (100.88 kg)   02/17/13  224 lb (101.606 kg)   09/11/12  214 lb 6.4 oz (97.251 kg)    Past Medical History   Diagnosis  Date   .  Atrial fibrillation    .  MVP (mitral valve prolapse)    .  Hyperlipidemia    .  LVH (left ventricular hypertrophy)      a. Echo 02/2013: Mild LVH, EF 55-65%, normal wall motion, mild LAE, mild RVE, mild RAE   .  H/O exercise stress test  02/2013     a. ETT-Echo with normal images but significant ST depression and hypotensive response to exercise    Current Outpatient Prescriptions   Medication  Sig  Dispense  Refill   .  aspirin 325 MG tablet  Take 325 mg by mouth daily.     .  fish oil-omega-3 fatty acids 1000 MG capsule  Take 2 g by mouth daily.     .  Glucosamine-Chondroit-Vit C-Mn (GLUCOSAMINE CHONDR 1500 COMPLX PO)  Take by mouth  2 (two) times daily.     .  Multiple Vitamin (MULTIVITAMIN) tablet  Take 1 tablet by mouth daily.     Marland Kitchen  omeprazole (PRILOSEC) 20 MG capsule  Take 1 capsule (20 mg total) by mouth daily.  30 capsule  1   .  Probiotic Product (PROBIOTIC DAILY PO)  Take by mouth.     .  rosuvastatin (CRESTOR) 10 MG tablet  Take 1 tablet (10 mg total) by mouth daily.  90 tablet  1    No current facility-administered medications for this visit.   Allergies: No Known Allergies  Social History: The patient reports that he has never smoked. He does not have any smokeless tobacco history on file. He reports that he does not drink alcohol or use illicit drugs.  Family History: The patient's family history is not on file. He is adopted.  ROS: Please see the history of present illness. All other systems reviewed and negative.  PHYSICAL EXAM:  VS: BP 100/82  Pulse 54  Ht 6\' 2"  (1.88 m)  Wt 222 lb 6.4 oz (100.88 kg)  BMI 28.54 kg/m2  Well nourished, well developed, in no acute distress  HEENT: normal  Neck: no JVD  Vascular: No carotid bruits  Cardiac: normal S1, S2; irregularly irregular rhythm; no murmur  Lungs: clear to auscultation bilaterally, no wheezing, rhonchi or rales  Abd: soft, nontender, no hepatomegaly  Ext: no edema  Skin: warm and dry  Neuro: CNs 2-12 intact, no focal abnormalities noted  EKG: Atrial fibrillation, HR 54  ASSESSMENT AND PLAN:  1. Chest Pain: As noted, his ETT-Echo was abnormal. I reviewed this with Dr. Delane Ginger and the patient has been set up for a cardiac catheterization. Risks and benefits of cardiac catheterization have been discussed with the patient. These include bleeding, infection, kidney damage, stroke, heart attack, death. The patient understands these risks and is willing to proceed.  2. Atrial Fibrillation: Rate controlled. CHADS2=0. He continues on aspirin. 3. Hyperlipidemia: Continue statin. 4. Disposition: Proceed with cardiac cath as noted. Signed,  Jayvier Burgher, PA-C 10:25 AM 03/04/2013   Dutch has had some concerning CP and had an abnormal stress echo.  I agree with plans for cath.   Vesta Mixer, [name], MD, [facility] 03/05/2013, 7:43 AM Office - [phone] Pager [phone]

## 2013-03-09 ENCOUNTER — Inpatient Hospital Stay (HOSPITAL_BASED_OUTPATIENT_CLINIC_OR_DEPARTMENT_OTHER)
Admission: RE | Admit: 2013-03-09 | Discharge: 2013-03-09 | Disposition: A | Discharge status: Home / Self Care | Payer: Managed Care, Other (non HMO) | Source: Ambulatory Visit | Attending: Cardiovascular Disease | Admitting: Cardiovascular Disease

## 2013-03-09 ENCOUNTER — Encounter (HOSPITAL_BASED_OUTPATIENT_CLINIC_OR_DEPARTMENT_OTHER)
Admission: RE | Disposition: A | Discharge status: Home / Self Care | Payer: Self-pay | Source: Ambulatory Visit | Attending: Cardiovascular Disease

## 2013-03-09 DIAGNOSIS — E785 Hyperlipidemia, unspecified: Secondary | ICD-10-CM | POA: Insufficient documentation

## 2013-03-09 DIAGNOSIS — I517 Cardiomegaly: Secondary | ICD-10-CM | POA: Insufficient documentation

## 2013-03-09 DIAGNOSIS — R9439 Abnormal result of other cardiovascular function study: Secondary | ICD-10-CM

## 2013-03-09 DIAGNOSIS — R079 Chest pain, unspecified: Secondary | ICD-10-CM

## 2013-03-09 DIAGNOSIS — I059 Rheumatic mitral valve disease, unspecified: Secondary | ICD-10-CM | POA: Insufficient documentation

## 2013-03-09 DIAGNOSIS — I4891 Unspecified atrial fibrillation: Secondary | ICD-10-CM | POA: Insufficient documentation

## 2013-03-09 DIAGNOSIS — R0789 Other chest pain: Secondary | ICD-10-CM | POA: Insufficient documentation

## 2013-03-09 SURGERY — JV LEFT HEART CATHETERIZATION WITH CORONARY ANGIOGRAM
Anesthesia: Moderate Sedation

## 2013-03-09 MED ORDER — SODIUM CHLORIDE 0.9 % IV SOLN
INTRAVENOUS | Status: DC
  Administered 2013-03-09: 11:00:00 via INTRAVENOUS

## 2013-03-09 MED ORDER — ONDANSETRON HCL 4 MG/2ML IJ SOLN: 4.0000 mg | Freq: Four times a day (QID) | INTRAMUSCULAR | Status: DC | PRN

## 2013-03-09 MED ORDER — ASPIRIN 81 MG PO CHEW
324.0000 mg | CHEWABLE_TABLET | ORAL | Status: AC
  Administered 2013-03-09: 324 mg via ORAL

## 2013-03-09 MED ORDER — SODIUM CHLORIDE 0.9 % IV SOLN: 250.0000 mL | INTRAVENOUS | Status: DC | PRN

## 2013-03-09 MED ORDER — SODIUM CHLORIDE 0.9 % IJ SOLN: 3.0000 mL | INTRAMUSCULAR | Status: DC | PRN

## 2013-03-09 MED ORDER — SODIUM CHLORIDE 0.9 % IV SOLN: INTRAVENOUS | Status: DC

## 2013-03-09 MED ORDER — SODIUM CHLORIDE 0.9 % IJ SOLN: 3.0000 mL | Freq: Two times a day (BID) | INTRAMUSCULAR | Status: DC

## 2013-03-09 MED ORDER — ACETAMINOPHEN 325 MG PO TABS: 650.0000 mg | ORAL_TABLET | ORAL | Status: DC | PRN

## 2013-03-09 NOTE — H&P (Signed)
[address]  [place], [place] [address]  Phone: [phone], Fax: [phone]  Date: 03/04/2013  ID: Dennis Cortez, DOB March 28, 1966, MRN [id number]  PCP: Dennis Cortez., DO  Primary Cardiologist: Dr. Delane Ginger  History of Present Illness:  Dennis Cortez is a 47 y.o. male who returns for f/u on chest pain. He has a hx of atrial fibrillation, HL, mitral valve prolapse. CHADS2=0. He is on ASA only. Echo 9/09: EF 55-60%, mild LVH, mild BAE. ETT-echo 9/09: Normal. I saw him recently with progressively worsening left-sided chest discomfort that he described as sharp that would typically occur with stress. He noted decreased exercise over the last year. I set him up for an ETT-Echo. The images were normal but he had significant ST depression with exercise and a hypotensive response to exercise. I reviewed this with Dr. Delane Ginger and we opted to arrange elective LHC to further define his anatomy. He has not had a change in his symptoms since last seen. No worsening CP. Still notes DOE with some activities.  Labs (3/14): K 3.7, Cr 1.3, Hgb 14.3, TSH 2.39  Wt Readings from Last 3 Encounters:   03/04/13  222 lb 6.4 oz (100.88 kg)   02/17/13  224 lb (101.606 kg)   09/11/12  214 lb 6.4 oz (97.251 kg)    Past Medical History   Diagnosis  Date   .  Atrial fibrillation    .  MVP (mitral valve prolapse)    .  Hyperlipidemia    .  LVH (left ventricular hypertrophy)      a. Echo 02/2013: Mild LVH, EF 55-65%, normal wall motion, mild LAE, mild RVE, mild RAE   .  H/O exercise stress test  02/2013     a. ETT-Echo with normal images but significant ST depression and hypotensive response to exercise    Current Outpatient Prescriptions   Medication  Sig  Dispense  Refill   .  aspirin 325 MG tablet  Take 325 mg by mouth daily.     .  fish oil-omega-3 fatty acids 1000 MG capsule  Take 2 g by mouth daily.     .  Glucosamine-Chondroit-Vit C-Mn (GLUCOSAMINE CHONDR 1500 COMPLX PO)  Take by mouth 2  (two) times daily.     .  Multiple Vitamin (MULTIVITAMIN) tablet  Take 1 tablet by mouth daily.     Marland Kitchen  omeprazole (PRILOSEC) 20 MG capsule  Take 1 capsule (20 mg total) by mouth daily.  30 capsule  1   .  Probiotic Product (PROBIOTIC DAILY PO)  Take by mouth.     .  rosuvastatin (CRESTOR) 10 MG tablet  Take 1 tablet (10 mg total) by mouth daily.  90 tablet  1    No current facility-administered medications for this visit.   Allergies: No Known Allergies  Social History: The patient reports that he has never smoked. He does not have any smokeless tobacco history on file. He reports that he does not drink alcohol or use illicit drugs.  Family History: The patient's family history is not on file. He is adopted.  ROS: Please see the history of present illness. All other systems reviewed and negative.  PHYSICAL EXAM:  VS: BP 100/82  Pulse 54  Ht 6\' 2"  (1.88 m)  Wt 222 lb 6.4 oz (100.88 kg)  BMI 28.54 kg/m2  Well nourished, well developed, in no acute distress  HEENT: normal  Neck: no JVD  Vascular: No carotid bruits  Cardiac: normal S1, S2; irregularly irregular rhythm; no murmur  Lungs: clear to auscultation bilaterally, no wheezing, rhonchi or rales  Abd: soft, nontender, no hepatomegaly  Ext: no edema  Skin: warm and dry  Neuro: CNs 2-12 intact, no focal abnormalities noted  EKG: Atrial fibrillation, HR 54  ASSESSMENT AND PLAN:  1. Chest Pain: As noted, his ETT-Echo was abnormal. I reviewed this with Dr. Delane Ginger and the patient has been set up for a cardiac catheterization. Risks and benefits of cardiac catheterization have been discussed with the patient. These include bleeding, infection, kidney damage, stroke, heart attack, death. The patient understands these risks and is willing to proceed.  2. Atrial Fibrillation: Rate controlled. CHADS2=0. He continues on aspirin. 3. Hyperlipidemia: Continue statin. 4. Disposition: Proceed with cardiac cath as noted.   Dennis Cortez has had some  concerning CP and had an abnormal stress echo. I agree with plans for cath.    Dennis Cortez, [name], MD, [facility] 03/09/2013, 3:47 PM Office - [phone] Pager [phone]

## 2013-03-09 NOTE — Progress Notes (Signed)
Bedrest begins @ 1235, tegaderm dressing applied to right groin site by Theodoro Grist, site level 0.

## 2013-03-09 NOTE — CV Procedure (Signed)
    Cardiac Cath Note  Dennis Cortez [id number] 07-04-1966  Procedure: Left  Heart Cardiac Catheterization Note Indications: chest discomfort  Procedure Details Consent: Obtained Time Out: Verified patient identification, verified procedure, site/side was marked, verified correct patient position, special equipment/implants available, Radiology Safety Procedures followed,  medications/allergies/relevent history reviewed, required imaging and test results available.  Performed   Medications: Fentanyl: 50 mcg IV Versed: 2 mg IV  The right femoral artery was easily canulated using a modified Seldinger technique.  Hemodynamics:    LV pressure: 102/10 Aortic pressure: 102/64  Angiography   Left Main: smooth and normal  Left anterior Descending:   The LAD is smooth and normal.  The distal LAD tapers in the distal segment but there are no significant stenosis.   1st and 2nd diagonals are normal  Left Circumflex: large and dominant, OM 1 is normal.  PDA, PLSA are normal.   Right Coronary Artery:  Small and non-dominant.  Smooth and normal  LV Gram: normal LV function  Complications: No apparent complications Patient did tolerate procedure well.  Contrast used: 70 cc  Conclusions:   1. Smooth and normal coronaries 2. Normal LV function.    Vesta Mixer, [name], MD, [facility] 03/09/2013, 12:29 PM Office - [phone] Pager [phone]

## 2013-03-09 NOTE — Interval H&P Note (Signed)
History and Physical Interval Note:  03/09/2013 11:57 AM  Dennis Cortez  has presented today for surgery, with the diagnosis of Anormal Stress Test  The various methods of treatment have been discussed with the patient and family. After consideration of risks, benefits and other options for treatment, the patient has consented to  Procedure(s): JV LEFT HEART CATHETERIZATION WITH CORONARY ANGIOGRAM (N/A) as a surgical intervention .  The patient's history has been reviewed, patient examined, no change in status, stable for surgery.  I have reviewed the patient's chart and labs.  Questions were answered to the patient's satisfaction.     Elyn Aquas.

## 2013-03-09 NOTE — Progress Notes (Signed)
Allen's test performed on right hand with negative results. 

## 2013-03-26 ENCOUNTER — Encounter: Payer: Self-pay | Admitting: Physician Assistant

## 2013-03-26 ENCOUNTER — Ambulatory Visit (INDEPENDENT_AMBULATORY_CARE_PROVIDER_SITE_OTHER): Payer: Managed Care, Other (non HMO) | Admitting: Physician Assistant

## 2013-03-26 VITALS — BP 116/74 | HR 61 | Ht 74.0 in | Wt 221.0 lb

## 2013-03-26 DIAGNOSIS — I4891 Unspecified atrial fibrillation: Secondary | ICD-10-CM

## 2013-03-26 DIAGNOSIS — E785 Hyperlipidemia, unspecified: Secondary | ICD-10-CM

## 2013-03-26 DIAGNOSIS — R079 Chest pain, unspecified: Secondary | ICD-10-CM

## 2013-03-26 NOTE — Patient Instructions (Signed)
Schedule follow up with Dr. Delane Ginger in 1 year.

## 2013-03-26 NOTE — Progress Notes (Signed)
[address] [place], [place]  [address] Phone: [phone], Fax:  [phone]  Date:  03/26/2013   ID:  Dennis Cortez, DOB September 17, 1966, MRN [id number]  PCP:  Terressa Koyanagi., DO  Primary Cardiologist:  Dr. Delane Ginger     History of Present Illness: Dennis Cortez is a 47 y.o. male who returns for f/u after a recent LHC.  He has a hx of atrial fibrillation, HL, mitral valve prolapse. CHADS2=0.  He is on ASA only.  Echo 9/09: EF 55-60%, mild LVH, mild BAE. ETT-echo 9/09: Normal.  I saw him recently with progressively worsening left-sided chest discomfort that he described as sharp that would typically occur with stress. He noted decreased exercise over the last year. I set him up for an ETT-Echo.  The images were normal but he had significant ST depression with exercise and a hypotensive response to exercise.  I reviewed this with Dr. Delane Ginger and we opted to arrange elective LHC to further define his anatomy.  LHC 03/09/13:  Smooth and normal coronary arteries, normal LVF.  He is doing well.  Still has occasional chest pain.  Otherwise, feels fine. No complaints.    Labs (3/14):  K 3.7, Cr 1.3, Hgb 14.3, TSH 2.39   Wt Readings from Last 3 Encounters:  03/26/13 221 lb (100.245 kg)  03/09/13 222 lb (100.699 kg)  03/09/13 222 lb (100.699 kg)     Past Medical History  Diagnosis Date  . Atrial fibrillation   . MVP (mitral valve prolapse)   . Hyperlipidemia   . LVH (left ventricular hypertrophy)     a. Echo 02/2013:  Mild LVH, EF 55-65%, normal wall motion, mild LAE, mild RVE, mild RAE   . H/O exercise stress test 02/2013    a. ETT-Echo with normal images but significant ST depression and hypotensive response to exercise  . Hx of cardiac catheterization     LHC 03/09/13:  Smooth and normal coronary arteries, normal LVF    Current Outpatient Prescriptions  Medication Sig Dispense Refill  . aspirin 325 MG tablet Take 325 mg by mouth daily.        . fish  oil-omega-3 fatty acids 1000 MG capsule Take 2 g by mouth daily.        . Glucosamine-Chondroit-Vit C-Mn (GLUCOSAMINE CHONDR 1500 COMPLX PO) Take by mouth 2 (two) times daily.        . Multiple Vitamin (MULTIVITAMIN) tablet Take 1 tablet by mouth daily.        Marland Kitchen omeprazole (PRILOSEC) 20 MG capsule Take 1 capsule (20 mg total) by mouth daily.  30 capsule  1  . Probiotic Product (PROBIOTIC DAILY PO) Take by mouth.      . rosuvastatin (CRESTOR) 10 MG tablet Take 1 tablet (10 mg total) by mouth daily.  90 tablet  1   No current facility-administered medications for this visit.    Allergies:   No Known Allergies  Social History:  The patient  reports that he has never smoked. He does not have any smokeless tobacco history on file. He reports that he does not drink alcohol or use illicit drugs.   PHYSICAL EXAM: VS:  BP 116/74  Pulse 61  Ht 6\' 2"  (1.88 m)  Wt 221 lb (100.245 kg)  BMI 28.36 kg/m2 Well nourished, well developed, in no acute distress HEENT: normal Neck: no JVD Vascular: No carotid bruits Cardiac:  normal S1, S2; irregularly irregular rhythm; no murmur Lungs:  clear to auscultation  bilaterally, no wheezing, rhonchi or rales Abd: soft, nontender, no hepatomegaly Ext: no edema; right groin without hematoma or bruit  Skin: warm and dry Neuro:  CNs 2-12 intact, no focal abnormalities noted  EKG:  Atrial fibrillation, HR 61  ASSESSMENT AND PLAN:  1. Chest Pain:  LHC with normal coronary arteries.  No further cardiac workup.  CP is non-cardiac.  He can f/u with his PCP to pursue other causes.  Continue PPI for now.   2. Atrial Fibrillation: Rate controlled. CHADS2=0. He continues on aspirin. 3. Hyperlipidemia: Continue statin. 4. Disposition:  F/u with Dr. Delane Ginger 1 year.  Signed, Tereso Newcomer, PA-C  2:03 PM 03/26/2013

## 2013-05-05 ENCOUNTER — Ambulatory Visit: Payer: Managed Care, Other (non HMO) | Admitting: Cardiovascular Disease

## 2013-06-15 ENCOUNTER — Other Ambulatory Visit: Payer: Self-pay | Admitting: Family Medicine

## 2013-07-20 ENCOUNTER — Ambulatory Visit (INDEPENDENT_AMBULATORY_CARE_PROVIDER_SITE_OTHER): Payer: Managed Care, Other (non HMO) | Admitting: Family Medicine

## 2013-07-20 VITALS — BP 100/72 | Temp 97.7°F | Wt 223.0 lb

## 2013-07-20 DIAGNOSIS — M545 Low back pain: Secondary | ICD-10-CM

## 2013-07-20 NOTE — Patient Instructions (Signed)
-  15 minutes heat twice daily  -tylenol 500-1000mg  up to 3 times daily or ibuprofen 400-600 mg up to two times daily  -topical menthol or capsicin product if needed  -follow up in 4 weeks

## 2013-07-20 NOTE — Progress Notes (Signed)
Chief Complaint  Patient presents with  . back and side pain    HPI:  Dennis Cortez is here for an acute visit for back pain: -started after trimming hedges about 1 week ago  -pain started the next day, L low back -a little radiation of pain in L lateral hip with certain movements -worse with certain movements -denies: fevers, chills, weakness or numbness, bowel or bladder issues  ROS: See pertinent positives and negatives per HPI.  Past Medical History  Diagnosis Date  . Atrial fibrillation   . MVP (mitral valve prolapse)   . Hyperlipidemia   . LVH (left ventricular hypertrophy)     a. Echo 02/2013:  Mild LVH, EF 55-65%, normal wall motion, mild LAE, mild RVE, mild RAE   . H/O exercise stress test 02/2013    a. ETT-Echo with normal images but significant ST depression and hypotensive response to exercise  . Hx of cardiac catheterization     LHC 03/09/13:  Smooth and normal coronary arteries, normal LVF    Family History  Problem Relation Age of Onset  . Adopted: Yes    History   Social History  . Marital Status: Married    Spouse Name: N/A    Number of Children: N/A  . Years of Education: N/A   Social History Main Topics  . Smoking status: Never Smoker   . Smokeless tobacco: Not on file  . Alcohol Use: No  . Drug Use: No  . Sexually Active:    Other Topics Concern  . Not on file   Social History Narrative  . No narrative on file    Current outpatient prescriptions:aspirin 325 MG tablet, Take 325 mg by mouth daily.  , Disp: , Rfl: ;  CRESTOR 10 MG tablet, TAKE 1 TABLET (10 MG TOTAL) BY MOUTH DAILY., Disp: 90 tablet, Rfl: 1;  fish oil-omega-3 fatty acids 1000 MG capsule, Take 2 g by mouth daily.  , Disp: , Rfl: ;  Glucosamine-Chondroit-Vit C-Mn (GLUCOSAMINE CHONDR 1500 COMPLX PO), Take by mouth 2 (two) times daily.  , Disp: , Rfl:  Multiple Vitamin (MULTIVITAMIN) tablet, Take 1 tablet by mouth daily.  , Disp: , Rfl: ;  omeprazole (PRILOSEC) 20 MG capsule, Take 1  capsule (20 mg total) by mouth daily., Disp: 30 capsule, Rfl: 1;  Probiotic Product (PROBIOTIC DAILY PO), Take by mouth., Disp: , Rfl:   EXAM:  Filed Vitals:   07/20/13 1629  BP: 100/72  Temp: 97.7 F (36.5 C)    Body mass index is 28.62 kg/(m^2).  GENERAL: vitals reviewed and listed above, alert, oriented, appears well hydrated and in no acute distress  HEENT: atraumatic, conjunttiva clear, no obvious abnormalities on inspection of external nose and ears  NECK: no obvious masses on inspection  MS: moves all extremities without noticeable abnormality Normal Gait Normal inspection of back, no obvious scoliosis or leg length descrepancy No bony TTP Soft tissue TTP at: L QD musculature -/+ tests: neg trendelenburg,-facet loading, -SLRT, -CLRT, -FABER, -FADIR Normal muscle strength, sensation to light touch and DTRs in LEs bilaterally  PSYCH: pleasant and cooperative, no obvious depression or anxiety  ASSESSMENT AND PLAN:  Discussed the following assessment and plan:  Low back pain  -hx and exam c/w muscle strain, advised supportive care and return precautions, HEP -Patient advised to return or notify a doctor immediately if symptoms worsen or persist or new concerns arise.  Patient Instructions  -15 minutes heat twice daily  -tylenol 500-1000mg  up to 3 times  daily or ibuprofen 400-600 mg up to two times daily  -topical menthol or capsicin product if needed  -follow up in 4 weeks        Amyria Komar R.

## 2013-08-26 ENCOUNTER — Other Ambulatory Visit: Payer: BC Managed Care – PPO

## 2013-08-27 ENCOUNTER — Other Ambulatory Visit (INDEPENDENT_AMBULATORY_CARE_PROVIDER_SITE_OTHER): Payer: Managed Care, Other (non HMO)

## 2013-08-27 DIAGNOSIS — E785 Hyperlipidemia, unspecified: Secondary | ICD-10-CM

## 2013-08-27 LAB — LIPID PANEL
Cholesterol: 157 mg/dL (ref 0–200)
LDL Cholesterol: 80 mg/dL (ref 0–99)
Triglycerides: 48 mg/dL (ref 0.0–149.0)

## 2013-08-27 NOTE — Progress Notes (Signed)
Quick Note:    Released to mychart.

## 2013-09-02 ENCOUNTER — Ambulatory Visit (INDEPENDENT_AMBULATORY_CARE_PROVIDER_SITE_OTHER): Payer: Managed Care, Other (non HMO) | Admitting: Family Medicine

## 2013-09-02 ENCOUNTER — Encounter: Payer: Self-pay | Admitting: Family Medicine

## 2013-09-02 VITALS — BP 98/60 | Temp 98.7°F | Ht 73.5 in | Wt 219.0 lb

## 2013-09-02 DIAGNOSIS — M549 Dorsalgia, unspecified: Secondary | ICD-10-CM

## 2013-09-02 DIAGNOSIS — I4891 Unspecified atrial fibrillation: Secondary | ICD-10-CM

## 2013-09-02 DIAGNOSIS — Z0000 Encounter for general adult medical examination without abnormal findings: Secondary | ICD-10-CM

## 2013-09-02 DIAGNOSIS — K219 Gastro-esophageal reflux disease without esophagitis: Secondary | ICD-10-CM

## 2013-09-02 DIAGNOSIS — E785 Hyperlipidemia, unspecified: Secondary | ICD-10-CM

## 2013-09-02 DIAGNOSIS — F1020 Alcohol dependence, uncomplicated: Secondary | ICD-10-CM

## 2013-09-02 DIAGNOSIS — Z23 Encounter for immunization: Secondary | ICD-10-CM

## 2013-09-02 MED ORDER — OMEPRAZOLE 20 MG PO CPDR: 20.0000 mg | DELAYED_RELEASE_CAPSULE | Freq: Every day | ORAL | Status: DC

## 2013-09-02 NOTE — Patient Instructions (Signed)
We recommend the following healthy lifestyle measures: - eat a healthy diet consisting of lots of vegetables, fruits, beans, nuts, seeds, healthy meats such as white chicken and fish and whole grains.  - avoid fried foods, fast food, processed foods, sodas, red meet and other fattening foods.  - get a least 150 minutes of aerobic exercise per week.   Cut back on alcohol use to no more then 3 drinks in 24 hours and no more then 14 drinks in a week  Follow up in: 1 year

## 2013-09-02 NOTE — Progress Notes (Signed)
Chief Complaint  Patient presents with  . Annual Exam    HPI:  Annual Exam:  1)Low back pain: -after trimming hedges 5-6 weeks ago -HEP and supportive care advised -reports: did exercises and resolved  2)A. Fib/HLD: -sees cardiology -takes ASA, crestor -denies: CP, SOB, DOE, palpitations  3) Alcohol use: -advised to cut back in the past -has cut back to 5 drinks occ  4)GERD: -takes omeprazole -works well  HM: -offered flu vaccine -had colonoscopy and PSA in the past due to being adopted - all normal per him -diet and exercise: gym 2 days per week, diet is poor  ROS: See pertinent positives and negatives per HPI. Review of Systems  Constitutional: Negative for weight loss and malaise/fatigue.  Eyes: Negative for blurred vision and double vision.  Respiratory: Negative for shortness of breath.   Gastrointestinal: Negative for heartburn, blood in stool and melena.  Genitourinary: Negative for dysuria and urgency.  Musculoskeletal: Negative for joint pain and falls.  Skin: Negative for rash.  Neurological: Negative for dizziness and loss of consciousness.  Endo/Heme/Allergies: Does not bruise/bleed easily.  Psychiatric/Behavioral: Negative for depression.     Past Medical History  Diagnosis Date  . Atrial fibrillation   . MVP (mitral valve prolapse)   . Hyperlipidemia   . LVH (left ventricular hypertrophy)     a. Echo 02/2013:  Mild LVH, EF 55-65%, normal wall motion, mild LAE, mild RVE, mild RAE   . H/O exercise stress test 02/2013    a. ETT-Echo with normal images but significant ST depression and hypotensive response to exercise  . Hx of cardiac catheterization     LHC 03/09/13:  Smooth and normal coronary arteries, normal LVF    Past Surgical History  Procedure Laterality Date  . Knee surgery    . US echocardiography  09/01/2008    EF 55-60%  . US echocardiography  08/17/2005    EF 55-60%    Family History  Problem Relation Age of Onset  . Adopted: Yes     History   Social History  . Marital Status: Married    Spouse Name: N/A    Number of Children: N/A  . Years of Education: N/A   Social History Main Topics  . Smoking status: Never Smoker   . Smokeless tobacco: None  . Alcohol Use: No  . Drug Use: No  . Sexual Activity:    Other Topics Concern  . None   Social History Narrative  . None    Current outpatient prescriptions:aspirin 325 MG tablet, Take 325 mg by mouth daily.  , Disp: , Rfl: ;  CRESTOR 10 MG tablet, TAKE 1 TABLET (10 MG TOTAL) BY MOUTH DAILY., Disp: 90 tablet, Rfl: 1;  fish oil-omega-3 fatty acids 1000 MG capsule, Take 2 g by mouth daily.  , Disp: , Rfl: ;  Glucosamine-Chondroit-Vit C-Mn (GLUCOSAMINE CHONDR 1500 COMPLX PO), Take by mouth 2 (two) times daily.  , Disp: , Rfl:  Multiple Vitamin (MULTIVITAMIN) tablet, Take 1 tablet by mouth daily.  , Disp: , Rfl: ;  Probiotic Product (PROBIOTIC DAILY PO), Take by mouth., Disp: , Rfl: ;  omeprazole (PRILOSEC) 20 MG capsule, Take 1 capsule (20 mg total) by mouth daily., Disp: 90 capsule, Rfl: 3  EXAM:  Filed Vitals:   09/02/13 0914  BP: 98/60  Temp: 98.7 F (37.1 C)    Body mass index is 28.5 kg/(m^2).  GENERAL: vitals reviewed and listed above, alert, oriented, appears well hydrated and in no acute distress  HEENT: atraumatic, conjunttiva clear, no obvious abnormalities on inspection of external nose and ears  NECK: no obvious masses on inspection  LUNGS: clear to auscultation bilaterally, no wheezes, rales or rhonchi, good air movement  CV: HRRR, no peripheral edema  MS: moves all extremities without noticeable abnormality  PSYCH: pleasant and cooperative, no obvious depression or anxiety  ABD: soft, NTTP  SKIN: lots of freckles, no suspicious lesions, sees derm once yearly  GU: deferred  ASSESSMENT AND PLAN:  Discussed the following assessment and plan:  Need for prophylactic vaccination and inoculation against influenza - Plan: Flu Vaccine  QUAD 36+ mos PF IM (Fluarix)  GERD (gastroesophageal reflux disease) - Plan: DISCONTINUED: omeprazole (PRILOSEC) 20 MG capsule  ATRIAL FIBRILLATION   HYPERLIPIDEMIA  Back pain  Moderate alcohol use disorder  -doing great, back pain resolved -lifestyle changes  -level A and B USPSTF recs -advised to cut back to safe alcohol levels -reviewed labs -flu given -meds refilled -Patient advised to return or notify a doctor immediately if symptoms worsen or persist or new concerns arise.  Patient Instructions  We recommend the following healthy lifestyle measures: - eat a healthy diet consisting of lots of vegetables, fruits, beans, nuts, seeds, healthy meats such as white chicken and fish and whole grains.  - avoid fried foods, fast food, processed foods, sodas, red meet and other fattening foods.  - get a least 150 minutes of aerobic exercise per week.   Cut back on alcohol use to no more then 3 drinks in 24 hours and no more then 14 drinks in a week  Follow up in: 1 year      Dennis Cortez R.

## 2013-12-07 ENCOUNTER — Other Ambulatory Visit: Payer: Self-pay | Admitting: Family Medicine

## 2014-03-29 ENCOUNTER — Ambulatory Visit (INDEPENDENT_AMBULATORY_CARE_PROVIDER_SITE_OTHER): Payer: BC Managed Care – PPO | Admitting: Cardiovascular Disease

## 2014-03-29 ENCOUNTER — Encounter: Payer: Self-pay | Admitting: Cardiovascular Disease

## 2014-03-29 VITALS — BP 110/78 | HR 62 | Ht 73.5 in | Wt 225.8 lb

## 2014-03-29 DIAGNOSIS — I4891 Unspecified atrial fibrillation: Secondary | ICD-10-CM

## 2014-03-29 DIAGNOSIS — I059 Rheumatic mitral valve disease, unspecified: Secondary | ICD-10-CM

## 2014-03-29 DIAGNOSIS — E785 Hyperlipidemia, unspecified: Secondary | ICD-10-CM

## 2014-03-29 NOTE — Patient Instructions (Signed)
Your physician recommends that you continue on your current medications as directed. Please refer to the Current Medication list given to you today.  Your physician wants you to follow-up in: 1 year with Dr. Nahser.  You will receive a reminder letter in the mail two months in advance. If you don't receive a letter, please call our office to schedule the follow-up appointment.  

## 2014-03-29 NOTE — Progress Notes (Signed)
Dennis BongoScott Cortez Date of Birth  02/15/1966       [facility]    [place] [facility] [address]  [address] [place], [place]  [address]   [place]  [id number] [phone]     [phone]   Fax  [phone]    Fax [phone]  Problem List: 1. Atrial Fibrillation 2. Hyperlipidemia  History of Present Illness:  Dennis Cortez is doing well.  Asymptomatic.  His last echo was 3 years ago.  He is working out at [employer] gym on a regular basis.  He has never had any symptoms related to his A-Fib.  His CHADS score is 0  March 29, 2014:  Dennis Cortez is doing well.  Not working out as much as he would like . Doing well from a cardiac standpoint.  No CP or dyspnea.      Current Outpatient Prescriptions on File Prior to Visit  Medication Sig Dispense Refill  . aspirin 325 MG tablet Take 325 mg by mouth daily.        . CRESTOR 10 MG tablet TAKE 1 TABLET BY MOUTH EVERY DAY  90 tablet  1  . fish oil-omega-3 fatty acids 1000 MG capsule Take 2 g by mouth daily.        . Glucosamine-Chondroit-Vit C-Mn (GLUCOSAMINE CHONDR 1500 COMPLX PO) Take by mouth 2 (two) times daily.        . Multiple Vitamin (MULTIVITAMIN) tablet Take 1 tablet by mouth daily.        Marland Kitchen. omeprazole (PRILOSEC) 20 MG capsule Take 1 capsule (20 mg total) by mouth daily.  90 capsule  3  . Probiotic Product (PROBIOTIC DAILY PO) Take by mouth.       No current facility-administered medications on file prior to visit.    No Known Allergies  Past Medical History  Diagnosis Date  . Atrial fibrillation   . MVP (mitral valve prolapse)   . Hyperlipidemia   . LVH (left ventricular hypertrophy)     a. Echo 02/2013:  Mild LVH, EF 55-65%, normal wall motion, mild LAE, mild RVE, mild RAE   . H/O exercise stress test 02/2013    a. ETT-Echo with normal images but significant ST depression and hypotensive response to exercise  . Hx of cardiac catheterization     LHC 03/09/13:  Smooth and normal coronary  arteries, normal LVF    Past Surgical History  Procedure Laterality Date  . Knee surgery    . [place] echocardiography  09/01/2008    EF 55-60%  . [place] echocardiography  08/17/2005    EF 55-60%    History  Smoking status  . Never Smoker   Smokeless tobacco  . Not on file    History  Alcohol Use No    Family History  Problem Relation Age of Onset  . Adopted: Yes    Reviw of Systems:  Reviewed in the HPI.  All other systems are negative.  Physical Exam: Blood pressure 110/78, pulse 62, height 6' 1.5" (1.867 m), weight 225 lb 12.8 oz (102.422 kg). General: Well developed, well nourished, in no acute distress.  Head: Normocephalic, atraumatic, sclera non-icteric, mucus membranes are moist,   Neck: Supple. Carotids are 2 + without bruits. No JVD  Lungs: Clear bilaterally to auscultation.  Heart: irregularly irregular.  normal  S1 S2. No significant  murmurs, gallops or rubs.  Abdomen: Soft, non-tender, non-distended with normal bowel sounds. No hepatomegaly. No rebound/guarding. No masses.  Msk:  Strength and tone are normal  Extremities: No clubbing or cyanosis. No edema.  Distal pedal pulses are 2+ and equal bilaterally.  Neuro: Alert and oriented X 3. Moves all extremities spontaneously.  Psych:  Responds to questions appropriately with a normal affect.  ECG: March 29, 2014:  Atrial fib with V response of 62.  No St or T wave changes.   Assessment / Plan:

## 2014-03-29 NOTE — Assessment & Plan Note (Signed)
Dennis Cortez is doing well.  Continue with his current medications.  We discussed the NOACs.    Will continue ASA for now.

## 2014-05-25 ENCOUNTER — Telehealth: Payer: Self-pay | Admitting: *Deleted

## 2014-05-25 ENCOUNTER — Encounter: Payer: Self-pay | Admitting: Family Medicine

## 2014-05-25 ENCOUNTER — Ambulatory Visit (INDEPENDENT_AMBULATORY_CARE_PROVIDER_SITE_OTHER): Payer: BC Managed Care – PPO | Admitting: Family Medicine

## 2014-05-25 VITALS — BP 100/72 | HR 62 | Temp 98.2°F | Ht 73.5 in | Wt 218.0 lb

## 2014-05-25 DIAGNOSIS — I059 Rheumatic mitral valve disease, unspecified: Secondary | ICD-10-CM

## 2014-05-25 DIAGNOSIS — R51 Headache: Secondary | ICD-10-CM

## 2014-05-25 DIAGNOSIS — I4891 Unspecified atrial fibrillation: Secondary | ICD-10-CM

## 2014-05-25 DIAGNOSIS — H547 Unspecified visual loss: Secondary | ICD-10-CM

## 2014-05-25 DIAGNOSIS — H53129 Transient visual loss, unspecified eye: Secondary | ICD-10-CM

## 2014-05-25 DIAGNOSIS — E785 Hyperlipidemia, unspecified: Secondary | ICD-10-CM

## 2014-05-25 NOTE — Addendum Note (Signed)
Addended by: Johnella Moloney on: 05/25/2014 03:35 PM   Modules accepted: Orders

## 2014-05-25 NOTE — Telephone Encounter (Signed)
Pt called and left a message for me requesting to have the MRI at [facility] location as his wife is going on vacation and since he is not having any symptoms now.  Gavin Pound was informed of this and she called the [employer] and spoke with someone and they told her the MRI is a mobile unit or truck and it is not there daily and will be back on Saturday and she is not aware of the schedule.  I called the pt and informed him of this and that Dr Selena Batten wanted him to have this done before Saturday if possible and Gavin Pound sent the order to [facility] STAT and they should call with an appt as soon as possible and he agreed.

## 2014-05-25 NOTE — Patient Instructions (Signed)
-  We placed a referral for you as discussed to the neurologist  -continue your aspirin daily  -if symptoms recur seek emergency care immediately

## 2014-05-25 NOTE — Telephone Encounter (Signed)
Per Dr Maudie Mercury she would like to have the pt be scheduled for an STAT MRI of the brain, carotid duplex study and ESR lab test due to transient visual loss. Orders were placed and I called the pt and advised him of this and someone will call with the appts for the MRI and carotid duplex and I scheduled the lab appt tomorrow.

## 2014-05-25 NOTE — Progress Notes (Signed)
Pre visit review using our clinic review tool, if applicable. No additional management support is needed unless otherwise documented below in the visit note. 

## 2014-05-25 NOTE — Progress Notes (Addendum)
No chief complaint on file.   HPI:  Acute visit for:  ? Migraine: -hx alcohol use, A. Fib and MVP followed by cardiologist, HLD and GERD -reports: yesterday sudden onset of blackout of 1/3 of vision in both eyes - like shade over his eye, this lasted about 45 minutes then resolved - did have a headache after the episode around R eye that resolved with excedrin -denies: headache today, double vision, vomiting, heart racing, nausea, fevers, malaise, dizziness, dsyarthria today, photophobia, sinus pain, fever, chills -he reports similar episode in one eye happened 2-3 years ago and resolved on its own so he was not worried about it because was ocular migraine -he saw his opthomologist today and reports he was told this was not an eye issue and was likely a migraine -occ word finding difficulty chronically over the last few years -recalls severe headaches remotely - but not recently  ROS: See pertinent positives and negatives per HPI.  Past Medical History  Diagnosis Date  . Atrial fibrillation   . MVP (mitral valve prolapse)   . Hyperlipidemia   . LVH (left ventricular hypertrophy)     a. Echo 02/2013:  Mild LVH, EF 55-65%, normal wall motion, mild LAE, mild RVE, mild RAE   . H/O exercise stress test 02/2013    a. ETT-Echo with normal images but significant ST depression and hypotensive response to exercise  . Hx of cardiac catheterization     LHC 03/09/13:  Smooth and normal coronary arteries, normal LVF    Past Surgical History  Procedure Laterality Date  . Knee surgery    . US echocardiography  09/01/2008    EF 55-60%  . US echocardiography  08/17/2005    EF 55-60%    Family History  Problem Relation Age of Onset  . Adopted: Yes    History   Social History  . Marital Status: Married    Spouse Name: N/A    Number of Children: N/A  . Years of Education: N/A   Social History Main Topics  . Smoking status: Never Smoker   . Smokeless tobacco: None  . Alcohol Use: No  .  Drug Use: No  . Sexual Activity:    Other Topics Concern  . None   Social History Narrative  . None    Current outpatient prescriptions:aspirin 325 MG tablet, Take 325 mg by mouth daily.  , Disp: , Rfl: ;  CRESTOR 10 MG tablet, TAKE 1 TABLET BY MOUTH EVERY DAY, Disp: 90 tablet, Rfl: 1;  fish oil-omega-3 fatty acids 1000 MG capsule, Take 2 g by mouth daily.  , Disp: , Rfl: ;  Glucosamine-Chondroit-Vit C-Mn (GLUCOSAMINE CHONDR 1500 COMPLX PO), Take by mouth 2 (two) times daily.  , Disp: , Rfl:  Multiple Vitamin (MULTIVITAMIN) tablet, Take 1 tablet by mouth daily.  , Disp: , Rfl: ;  omeprazole (PRILOSEC) 20 MG capsule, Take 1 capsule (20 mg total) by mouth daily., Disp: 90 capsule, Rfl: 3  EXAM:  Filed Vitals:   05/25/14 1356  BP: 100/72  Pulse: 62  Temp: 98.2 F (36.8 C)    Body mass index is 28.37 kg/(m^2).  GENERAL: vitals reviewed and listed above, alert, oriented, appears well hydrated and in no acute distress  HEENT: atraumatic, conjunttiva clear, PERRLA, no tempral art TTP or bruit, no obvious abnormalities on inspection of external nose and ears  NECK: no obvious masses on inspection, no carotid bruit, no meningeal signs  LUNGS: clear to auscultation bilaterally, no wheezes, rales or  rhonchi, good air movement  CV: HRRR, no peripheral edema  MS: moves all extremities without noticeable abnormality  PSYCH: pleasant and cooperative, no obvious depression or anxiety  NEURO: CN II-XII, no nystagmus, normal gait, strength intact throughout, normal speech, visual acuity normal, finger to nos normal, no pronator drift  ASSESSMENT AND PLAN:  Discussed the following assessment and plan:  Loss of vision - Plan: Ambulatory referral to Neurology  Headache(784.0) - Plan: Ambulatory referral to Neurology  HYPERLIPIDEMIA  MITRAL VALVE PROLAPSE  ATRIAL FIBRILLATION   -we discussed possible serious and likely etiologies, workup and treatment, treatment risks and return  precautions - neuro exam normal and normal optho exam today - 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   -Patient advised to return or notify a doctor immediately if symptoms worsen or persist or new concerns arise.  Patient Instructions  -We placed a referral for you as discussed to the neurologist  -continue your aspirin daily  -if symptoms recur seek emergency care immediately       KIM, Nickola Major.  ADDENDUM: Was unable to obtain stat appt with neurolgist at his appt so had assistant schedule urgent MRI, esr, dopplers.

## 2014-05-26 ENCOUNTER — Ambulatory Visit (HOSPITAL_COMMUNITY)
Admission: RE | Admit: 2014-05-26 | Discharge: 2014-05-26 | Disposition: A | Discharge status: Home / Self Care | Payer: BC Managed Care – PPO | Source: Ambulatory Visit | Attending: Cardiology | Admitting: Cardiology

## 2014-05-26 ENCOUNTER — Other Ambulatory Visit (INDEPENDENT_AMBULATORY_CARE_PROVIDER_SITE_OTHER): Payer: BC Managed Care – PPO

## 2014-05-26 DIAGNOSIS — H53129 Transient visual loss, unspecified eye: Secondary | ICD-10-CM

## 2014-05-26 LAB — SEDIMENTATION RATE: Sed Rate: 7 mm/hr (ref 0–22)

## 2014-05-26 NOTE — Progress Notes (Signed)
Carotid Duplex Completed. °Brianna L Mazza,RVT °

## 2014-05-27 ENCOUNTER — Telehealth: Payer: Self-pay | Admitting: Cardiovascular Disease

## 2014-05-27 ENCOUNTER — Telehealth: Payer: Self-pay | Admitting: Neurology

## 2014-05-27 ENCOUNTER — Ambulatory Visit (INDEPENDENT_AMBULATORY_CARE_PROVIDER_SITE_OTHER): Payer: BC Managed Care – PPO | Admitting: Neurology

## 2014-05-27 ENCOUNTER — Ambulatory Visit
Admission: RE | Admit: 2014-05-27 | Discharge: 2014-05-27 | Disposition: A | Discharge status: Home / Self Care | Payer: BC Managed Care – PPO | Source: Ambulatory Visit | Attending: Family Medicine | Admitting: Family Medicine

## 2014-05-27 ENCOUNTER — Ambulatory Visit (INDEPENDENT_AMBULATORY_CARE_PROVIDER_SITE_OTHER): Payer: BC Managed Care – PPO | Admitting: Pharmacist Clinician (PhC)/ Clinical Pharmacy Specialist

## 2014-05-27 ENCOUNTER — Telehealth: Payer: Self-pay | Admitting: Family Medicine

## 2014-05-27 ENCOUNTER — Encounter: Payer: Self-pay | Admitting: Neurology

## 2014-05-27 VITALS — BP 140/84 | HR 84 | Temp 97.8°F | Resp 20 | Ht 75.0 in | Wt 220.0 lb

## 2014-05-27 DIAGNOSIS — Z7901 Long term (current) use of anticoagulants: Secondary | ICD-10-CM

## 2014-05-27 DIAGNOSIS — H53129 Transient visual loss, unspecified eye: Secondary | ICD-10-CM

## 2014-05-27 DIAGNOSIS — H539 Unspecified visual disturbance: Secondary | ICD-10-CM

## 2014-05-27 DIAGNOSIS — I4891 Unspecified atrial fibrillation: Secondary | ICD-10-CM

## 2014-05-27 MED ORDER — RIVAROXABAN 20 MG PO TABS: 20.0000 mg | ORAL_TABLET | Freq: Every day | ORAL | Status: DC

## 2014-05-27 MED ORDER — GADOBENATE DIMEGLUMINE 529 MG/ML IV SOLN
20.0000 mL | Freq: Once | INTRAVENOUS | Status: AC | PRN
  Administered 2014-05-27: 20 mL via INTRAVENOUS

## 2014-05-27 NOTE — Telephone Encounter (Signed)
Pt would appreciate a call.  Advised pt dr Selena Batten will call him right back. [phone]

## 2014-05-27 NOTE — Progress Notes (Signed)
NEUROLOGY CONSULTATION NOTE  Dennis BongoScott Valdez MRN: [id number] DOB: [birth year]  Referring provider: Dr. Selena BattenKim Primary care provider: Dr. Selena BattenKim  Reason for consult:  Transient vision loss  HISTORY OF PRESENT ILLNESS: Dennis Cortez is a 48 year old left-handed man with history of atrial fibrillation (on ASA), MVP, hyperlipidemia, migraine and history of alcohol use who presents for transient vision loss.  Records personally reviewed.  On 05/24/14, he had sudden onset of vision loss while driving.  He describes it as left homonymous hemianopsia with slanting up to the right.  The visual disturbance was monocular, involving each eye individually.  It lasted about 45 minutes and resolved.  Afterwards, he developed a right retro-orbital aching pain, lasting about a day.  There was no associated double vision, unilateral weakness, unilateral numbness, dizziness, or slurred speech.  He saw his ophthalmologist the next day.  His exam was normal.    He reports an episode of visual disturbance that occurred about 2 or 3 years ago.  At that time, he developed hemi-vision loss associated with wavy lines in only his left eye, and lasting 10 to 15 minutes.  It was not associated with headache.  He does report remote history of headaches as a child, in which he had to lay down with a rag over his eyes.  These were associated with nausea.  They resolved after age 48 or [age].  He is adopted so family history is unknown.  He does have a history of atrial fibrillation.  He is on aspirin but not anticoagulation.  Echo from March 2014 revealed mild LVH, EF 55-65%, normal wall motion, mild LAE, mild RVE, mild RAE.  Echo stress test revealed significant ST depression and hypotensive response to exercise.  Cardiac cath revealed normal LVF and normal coronary arteries.  He had a carotid doppler performed yesterday, but it has not yet been read.  He is scheduled for an upcoming MRI of the brain with and without  contrast.  05/26/14 LABS:  Sed Rate 7 08/27/13 LABS:  LDL 80, Hgb A1c 5.2  PAST MEDICAL HISTORY: Past Medical History  Diagnosis Date  . Atrial fibrillation   . MVP (mitral valve prolapse)   . Hyperlipidemia   . LVH (left ventricular hypertrophy)     a. Echo 02/2013:  Mild LVH, EF 55-65%, normal wall motion, mild LAE, mild RVE, mild RAE   . H/O exercise stress test 02/2013    a. ETT-Echo with normal images but significant ST depression and hypotensive response to exercise  . Hx of cardiac catheterization     LHC 03/09/13:  Smooth and normal coronary arteries, normal LVF    PAST SURGICAL HISTORY: Past Surgical History  Procedure Laterality Date  . Knee surgery    . [place] echocardiography  09/01/2008    EF 55-60%  . [place] echocardiography  08/17/2005    EF 55-60%    MEDICATIONS: Current Outpatient Prescriptions on File Prior to Visit  Medication Sig Dispense Refill  . aspirin 325 MG tablet Take 325 mg by mouth daily.        . CRESTOR 10 MG tablet TAKE 1 TABLET BY MOUTH EVERY DAY  90 tablet  1  . fish oil-omega-3 fatty acids 1000 MG capsule Take 2 g by mouth daily.        . Glucosamine-Chondroit-Vit C-Mn (GLUCOSAMINE CHONDR 1500 COMPLX PO) Take by mouth 2 (two) times daily.        . Multiple Vitamin (MULTIVITAMIN) tablet Take 1 tablet by mouth daily.        .Marland Kitchen  omeprazole (PRILOSEC) 20 MG capsule Take 1 capsule (20 mg total) by mouth daily.  90 capsule  3   No current facility-administered medications on file prior to visit.    ALLERGIES: No Known Allergies  FAMILY HISTORY: Family History  Problem Relation Age of Onset  . Adopted: Yes    SOCIAL HISTORY: History   Social History  . Marital Status: Married    Spouse Name: N/A    Number of Children: N/A  . Years of Education: N/A   Occupational History  . Not on file.   Social History Main Topics  . Smoking status: Never Smoker   . Smokeless tobacco: Not on file  . Alcohol Use: No  . Drug Use: Yes  . Sexual Activity: Yes     Partners: Female   Other Topics Concern  . Not on file   Social History Narrative  . No narrative on file    REVIEW OF SYSTEMS: Constitutional: No fevers, chills, or sweats, no generalized fatigue, change in appetite Eyes: No visual changes, double vision, eye pain Ear, nose and throat: No hearing loss, ear pain, nasal congestion, sore throat Cardiovascular: No chest pain, palpitations Respiratory:  No shortness of breath at rest or with exertion, wheezes GastrointestinaI: No nausea, vomiting, diarrhea, abdominal pain, fecal incontinence Genitourinary:  No dysuria, urinary retention or frequency Musculoskeletal:  No neck pain, back pain Integumentary: No rash, pruritus, skin lesions Neurological: as above Psychiatric: No depression, insomnia, anxiety Endocrine: No palpitations, fatigue, diaphoresis, mood swings, change in appetite, change in weight, increased thirst Hematologic/Lymphatic:  No anemia, purpura, petechiae. Allergic/Immunologic: no itchy/runny eyes, nasal congestion, recent allergic reactions, rashes  PHYSICAL EXAM: Filed Vitals:   05/27/14 1042  BP: 140/84  Pulse: 84  Temp: 97.8 F (36.6 C)  Resp: 20   General: No acute distress Head:  Normocephalic/atraumatic Neck: supple, no paraspinal tenderness, full range of motion Back: No paraspinal tenderness Heart: irregular rate and rhythm Lungs: Clear to auscultation bilaterally. Vascular: No carotid bruits. Neurological Exam: Mental status: alert and oriented to person, place, and time, recent and remote memory intact, fund of knowledge intact, attention and concentration intact, speech fluent and not dysarthric, language intact. Cranial nerves: CN I: not tested CN II: pupils equal, round and reactive to light, visual fields intact, fundi unremarkable, without vessel changes, exudates, hemorrhages or papilledema. CN III, IV, VI:  full range of motion, no nystagmus, no ptosis CN V: facial sensation  intact CN VII: upper and lower face symmetric CN VIII: hearing intact CN IX, X: gag intact, uvula midline CN XI: sternocleidomastoid and trapezius muscles intact CN XII: tongue midline Bulk & Tone: normal, no fasciculations. Motor: 5 out of 5 Sensation: Temperature and vibration intact Deep Tendon Reflexes: 2+ throughout Finger to nose testing: No dysmetria Heel to shin: No dysmetria Gait: Normal station and stride. Able to walk in tandem. Romberg negative.  IMPRESSION: Transient vision loss.  Likely migraine, given accompanying headache, similar episode in the past, and remote past history of what sounds like migraines.  Less likely to be amaurosis fugax given that it was binocular.  A posterior stroke is a possibility however.   PLAN: 1.  Follow up results of carotid doppler 2.  Has MRI of the brain later today 3.  If MRI does reveal stroke, then would initiate anticoagulation.  45 minutes spent with patient, over 50% spent counseling and coordinating care.  Thank you for allowing me to take part in the care of this patient.  Shon Millet,  DO  CC:  Kriste Basque, DO

## 2014-05-27 NOTE — Progress Notes (Signed)
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    Reviewed patients medication list.  Pt is not currently on any combined P-gp and strong CYP3A4 inhibitors/inducers (ketoconazole, traconazole, ritonavir, carbamazepine, phenytoin, rifampin, St. John's wort).  Weight 220.  Pt had labs drawn at Dr. Dahlia Client Kim's office earlier this week, we will contact them for labs before drawing more.  Pt states had episode of N/V on Saturday morning around 2 am, then developed vision problems while driving on Monday.  States it was like vertical blinds across his vision in both eyes at a diagonal.  Managed to get off the road and to his parent's home nearby.  Had a headache in his left eye for awhile afterwards.  Went to optometrist on Tuesday who stated vision was fine and then later to Dr. Kriste Basque.  He had a carotid echo on Wednesday then saw a neurologist Thursday morning who sent him for MRI.  MRI noted pt to have occular stroke.  Pt has history of AF, but CHADS2 score has always been 0 and therefore not on any anticoagulation other than ASA 325.  A full discussion of the nature of anticoagulants has been carried out.  A benefit/risk analysis has been presented to the patient, so that they understand the justification for choosing anticoagulation with Xarelto at this time.  The need for compliance is stressed.  Pt is aware to take the medication once daily with the largest meal of the day.  Side effects of potential bleeding are discussed, including unusual colored urine or stools, coughing up blood or coffee ground emesis, nose bleeds or serious fall or head trauma.  Discussed signs and symptoms of stroke. The patient should avoid any OTC items containing aspirin or ibuprofen.  He will stop Aspirin.  Avoid alcohol consumption.   Call if any signs of abnormal bleeding.  Gave coupon for free med x 1 year.  Next lab test test in 6 months.   Pt reviewed need for anticoagulation with Dr.  Excell Seltzer as well.  He will make a follow up appointment with Dr. Elease Hashimoto as well.

## 2014-05-27 NOTE — Telephone Encounter (Signed)
Spoke with the patient's primary care physician , Dr. Selena Batten, from a DOD call. The patient is followed by Dr. Elease Hashimoto. He is 47 years old with a history of paroxysmal atrial fibrillation. His CHADS-Vasc score has previously been 0. He presented with visual symptoms and underwent an MRI. This demonstrated an ocular stroke. Considering this finding, we have discussed the fact that he now has an indication for anticoagulation in the setting of his atrial fibrillation. The patient is apparently interested in a novel anticoagulant drug. Recommend starting Xarelto 20 mg daily. Will arrange an anticoagulation clinic visit to get him started on medication and review potential risks and benefits of this.

## 2014-05-27 NOTE — Telephone Encounter (Signed)
Spoke with pt and gave him information from Dr.Cooper regarding starting Xarelto. Pt will come to office today at 3:45 for appointment with Alfonse Ras, PharmD to discuss starting Xarelto.

## 2014-05-27 NOTE — Patient Instructions (Signed)
Most likely the episode was a migraine.  However, a stroke needs to be ruled out as well.  If it was a stroke, then I would recommend switching to an anticoagulant such as coumadin.  Will look out for the MRI and carotid doppler tomorrow.  If the MRI did show a stroke, then they will contact Dr. Selena Batten, since she ordered the test.

## 2014-05-27 NOTE — Telephone Encounter (Signed)
Talked with pt about MRI results after discussion with neurology and cardiology. Cardiology office will see him to start him on anticoagulation. Pt on his way there now.

## 2014-05-27 NOTE — Telephone Encounter (Signed)
Rec'd call from Dr. Elmyra Ricks office regarding patient's MRI brain results showing acute right occipital stroke.  He is has history of atrial fibrillation, currently on aspirin.  Given that stroke mechanism is likely cardioembolic, would recommend anticoagulation.  Regarding other stroke risk factors, he has history of HPL on crestor, BP is well controlled, and no evidence of diabetes.    Donika K. Allena Katz, DO

## 2014-05-28 ENCOUNTER — Ambulatory Visit (INDEPENDENT_AMBULATORY_CARE_PROVIDER_SITE_OTHER): Payer: BC Managed Care – PPO | Admitting: *Deleted

## 2014-05-28 ENCOUNTER — Telehealth: Payer: Self-pay | Admitting: Pharmacist Clinician (PhC)/ Clinical Pharmacy Specialist

## 2014-05-28 ENCOUNTER — Telehealth: Payer: Self-pay | Admitting: Neurology

## 2014-05-28 DIAGNOSIS — I059 Rheumatic mitral valve disease, unspecified: Secondary | ICD-10-CM

## 2014-05-28 DIAGNOSIS — I480 Paroxysmal atrial fibrillation: Secondary | ICD-10-CM

## 2014-05-28 DIAGNOSIS — E785 Hyperlipidemia, unspecified: Secondary | ICD-10-CM

## 2014-05-28 LAB — CBC WITH DIFFERENTIAL/PLATELET
Basophils Absolute: 0 10*3/uL (ref 0.0–0.1)
Basophils Relative: 0.5 % (ref 0.0–3.0)
EOS ABS: 0.1 10*3/uL (ref 0.0–0.7)
EOS PCT: 1 % (ref 0.0–5.0)
HEMATOCRIT: 43.5 % (ref 39.0–52.0)
Hemoglobin: 14.7 g/dL (ref 13.0–17.0)
LYMPHS ABS: 1.9 10*3/uL (ref 0.7–4.0)
Lymphocytes Relative: 31.7 % (ref 12.0–46.0)
MCHC: 33.9 g/dL (ref 30.0–36.0)
MCV: 95.8 fl (ref 78.0–100.0)
Monocytes Absolute: 0.6 10*3/uL (ref 0.1–1.0)
Monocytes Relative: 9.1 % (ref 3.0–12.0)
Neutro Abs: 3.5 10*3/uL (ref 1.4–7.7)
Neutrophils Relative %: 57.7 % (ref 43.0–77.0)
Platelets: 176 10*3/uL (ref 150.0–400.0)
RBC: 4.54 Mil/uL (ref 4.22–5.81)
RDW: 13.6 % (ref 11.5–15.5)
WBC: 6.1 10*3/uL (ref 4.0–10.5)

## 2014-05-28 LAB — HEPATIC FUNCTION PANEL
ALBUMIN: 4.8 g/dL (ref 3.5–5.2)
ALT: 24 U/L (ref 0–53)
AST: 27 U/L (ref 0–37)
Alkaline Phosphatase: 57 U/L (ref 39–117)
Bilirubin, Direct: 0 mg/dL (ref 0.0–0.3)
Total Bilirubin: 0.9 mg/dL (ref 0.2–1.2)
Total Protein: 7.4 g/dL (ref 6.0–8.3)

## 2014-05-28 LAB — BASIC METABOLIC PANEL
BUN: 13 mg/dL (ref 6–23)
CALCIUM: 9.8 mg/dL (ref 8.4–10.5)
CO2: 30 mEq/L (ref 19–32)
CREATININE: 1.1 mg/dL (ref 0.4–1.5)
Chloride: 103 mEq/L (ref 96–112)
GFR: 77.12 mL/min (ref >60.00)
GLUCOSE: 87 mg/dL (ref 70–99)
Potassium: 3.8 mEq/L (ref 3.5–5.1)
Sodium: 139 mEq/L (ref 135–145)

## 2014-05-28 NOTE — Telephone Encounter (Signed)
Followed up with Dennis Cortez.  MRI did confirm small posterior (occipital) stroke.  He was started on Xarelto.  Carotid dopplers not officially read yet, but carotid stenosis wouldn't have caused this stroke anyway as it is a different vascular territory.  LDL looks good and no diabetes, so no other workup is necessary that would change management for secondary stroke prevention.  Will have him follow up in 3 months.

## 2014-05-28 NOTE — Telephone Encounter (Signed)
Agree with xarelto ( or any of the NOACS) given his recent ocular stroke

## 2014-05-28 NOTE — Telephone Encounter (Signed)
Reviewed with Dr. Selena Batten, she did not get CMET or CBC.  Spoke with patient, he will go to [facility] this afternoon to get blood work completed.

## 2014-06-01 ENCOUNTER — Telehealth: Payer: Self-pay | Admitting: Pharmacist Clinician (PhC)/ Clinical Pharmacy Specialist

## 2014-06-01 NOTE — Telephone Encounter (Signed)
LMOM labs fine, Xarelto 20mg  dose is correct

## 2014-06-03 ENCOUNTER — Ambulatory Visit: Payer: BC Managed Care – PPO | Admitting: Neurology

## 2014-06-08 ENCOUNTER — Ambulatory Visit: Payer: BC Managed Care – PPO | Admitting: Neurology

## 2014-06-21 ENCOUNTER — Ambulatory Visit (INDEPENDENT_AMBULATORY_CARE_PROVIDER_SITE_OTHER): Payer: BC Managed Care – PPO | Admitting: Family Medicine

## 2014-06-21 ENCOUNTER — Ambulatory Visit (INDEPENDENT_AMBULATORY_CARE_PROVIDER_SITE_OTHER)
Admission: RE | Admit: 2014-06-21 | Discharge: 2014-06-21 | Disposition: A | Discharge status: Home / Self Care | Payer: BC Managed Care – PPO | Source: Ambulatory Visit | Attending: Family Medicine | Admitting: Family Medicine

## 2014-06-21 ENCOUNTER — Telehealth: Payer: Self-pay | Admitting: Family Medicine

## 2014-06-21 ENCOUNTER — Encounter: Payer: Self-pay | Admitting: Family Medicine

## 2014-06-21 VITALS — BP 112/76 | HR 77 | Temp 98.5°F | Ht 73.5 in | Wt 222.0 lb

## 2014-06-21 DIAGNOSIS — M546 Pain in thoracic spine: Secondary | ICD-10-CM

## 2014-06-21 NOTE — Telephone Encounter (Signed)
Advise office visit - can schedule in afternoon slot today

## 2014-06-21 NOTE — Telephone Encounter (Signed)
I called the pt and scheduled an appt for today at 1:30pm.

## 2014-06-21 NOTE — Patient Instructions (Signed)
-  go get xrays  -heat 15 minutes twice daily  -tylenol 500-1000mg  up to 3 times daily if needed  -exercises provided 4-5 days per week  -sleep in other bed for 2 weeks  -follow up in 3-4 weeks or sooner if worsening or change in symptoms or concerns

## 2014-06-21 NOTE — Progress Notes (Signed)
Pre visit review using our clinic review tool, if applicable. No additional management support is needed unless otherwise documented below in the visit note. 

## 2014-06-21 NOTE — Telephone Encounter (Signed)
Patient Information:  Caller Name: Demian  Phone: [phone]  Patient: Dennis Cortez, Dennis Cortez  Gender: Male  DOB: [birth year]  Age: 48 Years  PCP: Selena Batten (TEXT 1st, after 20 mins can call), [facility]ahl[facility] Client [facility])  Office Follow Up:  Does the office need to follow up with this patient?: Yes  Instructions For The Office: Please advise this pt. regarding recent onset of pain over the ribs in his back only when sleeping. He is fine up moving around and there is no issue. Feels like something is building up in his system to cause this.  RN Note:  Please advise this pt. regarding pain in his back only when sleeping. Asking if related to the Xarelto he is taking.  Symptoms  Reason For Call & Symptoms: Pt. in on June 22,2015 with a small ocular stroke. On June 03, 2014,had MRI and diagnosed with a Stroke. Placed on Xarelto. The last 5 nights, has had pain over the back in the rib area. Wonders if related to this medication. No problems with breathing. It is worse when he lays flat on his back. Hard to turn over in the bed. When he gets up and gets moving, there is no issue. Wonders if there is something building up in his system to cause this since  has no issue other than when in bed trying to sleep.  Reviewed Health History In EMR: Yes  Reviewed Medications In EMR: Yes  Reviewed Allergies In EMR: Yes  Reviewed Surgeries / Procedures: Yes  Date of Onset of Symptoms: 06/16/2014  Guideline(s) Used:  No Protocol Available - Information Only  Disposition Per Guideline:   Discuss with PCP and Callback by Nurse Today  Reason For Disposition Reached:   Nursing judgment  Advice Given:  Call Back If:  New symptoms develop  You become worse.  Patient Will Follow Care Advice:  YES

## 2014-06-21 NOTE — Progress Notes (Signed)
No chief complaint on file.   HPI:  Acute visit for:  1) Back Pain: -started: 5 days ago - maybe started at beach a few weeks ago a little - got a new bed at this time -symptoms: sharp, moderate, mid back pain - intermittent -worse with/better with: turning in bed, with twisting - better with not making these movements -denies:fevers, chills, nose bleed, gum bleeding, SO, malaise -has tried: took excedrin and ok -has been golfing, mowed grassed over the weekend  ROS: See pertinent positives and negatives per HPI.  Past Medical History  Diagnosis Date  . Atrial fibrillation   . MVP (mitral valve prolapse)   . Hyperlipidemia   . LVH (left ventricular hypertrophy)     a. Echo 02/2013:  Mild LVH, EF 55-65%, normal wall motion, mild LAE, mild RVE, mild RAE   . H/O exercise stress test 02/2013    a. ETT-Echo with normal images but significant ST depression and hypotensive response to exercise  . Hx of cardiac catheterization     LHC 03/09/13:  Smooth and normal coronary arteries, normal LVF    Past Surgical History  Procedure Laterality Date  . Knee surgery    . [place] echocardiography  09/01/2008    EF 55-60%  . [place] echocardiography  08/17/2005    EF 55-60%    Family History  Problem Relation Age of Onset  . Adopted: Yes    History   Social History  . Marital Status: Married    Spouse Name: N/A    Number of Children: N/A  . Years of Education: N/A   Social History Main Topics  . Smoking status: Never Smoker   . Smokeless tobacco: None  . Alcohol Use: No  . Drug Use: Yes  . Sexual Activity: Yes    Partners: Female   Other Topics Concern  . None   Social History Narrative  . None    Current outpatient prescriptions:CRESTOR 10 MG tablet, TAKE 1 TABLET BY MOUTH EVERY DAY, Disp: 90 tablet, Rfl: 1;  fish oil-omega-3 fatty acids 1000 MG capsule, Take 2 g by mouth daily.  , Disp: , Rfl: ;  Glucosamine-Chondroit-Vit C-Mn (GLUCOSAMINE CHONDR 1500 COMPLX PO), Take by mouth 2  (two) times daily.  , Disp: , Rfl: ;  Multiple Vitamin (MULTIVITAMIN) tablet, Take 1 tablet by mouth daily.  , Disp: , Rfl:  omeprazole (PRILOSEC) 20 MG capsule, Take 1 capsule (20 mg total) by mouth daily., Disp: 90 capsule, Rfl: 3;  rivaroxaban (XARELTO) 20 MG TABS tablet, Take 1 tablet (20 mg total) by mouth daily with supper., Disp: 90 tablet, Rfl: 3  EXAM:  Filed Vitals:   06/21/14 1333  BP: 112/76  Pulse: 77  Temp: 98.5 F (36.9 C)    Body mass index is 28.89 kg/(m^2).  GENERAL: vitals reviewed and listed above, alert, oriented, appears well hydrated and in no acute distress  HEENT: atraumatic, conjunttiva clear, no obvious abnormalities on inspection of external nose and ears  NECK: no obvious masses on inspection  LUNGS: clear to auscultation bilaterally, no wheezes, rales or rhonchi, good air movement  CV: HRRR, no peripheral edema  MS: moves all extremities without noticeable abnormality, TTP bilat thoracic paraspinal muscles L >R, no bony TTP  PSYCH: pleasant and cooperative, no obvious depression or anxiety  ASSESSMENT AND PLAN:  Discussed the following assessment and plan:  Bilateral thoracic back pain - Plan: DG Chest 2 View, DG Thoracic Spine W/Swimmers  -we discussed possible serious and likely etiologies,  workup and treatment, treatment risks and return precautions -after this discussion, Yadriel opted for cxr, plain films, HEP, supportive care, trying different bed -follow up advised in 3-4 weeks -of course, we advised Hawk  to return or notify a doctor immediately if symptoms worsen or persist or new concerns arise.  .  -Patient advised to return or notify a doctor immediately if symptoms worsen or persist or new concerns arise.  There are no Patient Instructions on file for this visit.   Kriste Basque R.

## 2014-07-05 ENCOUNTER — Other Ambulatory Visit: Payer: Self-pay | Admitting: Family Medicine

## 2014-07-30 ENCOUNTER — Ambulatory Visit (INDEPENDENT_AMBULATORY_CARE_PROVIDER_SITE_OTHER): Payer: BC Managed Care – PPO | Admitting: Cardiovascular Disease

## 2014-07-30 ENCOUNTER — Encounter: Payer: Self-pay | Admitting: Cardiovascular Disease

## 2014-07-30 VITALS — BP 100/84 | HR 56 | Ht 73.5 in | Wt 217.0 lb

## 2014-07-30 DIAGNOSIS — I4891 Unspecified atrial fibrillation: Secondary | ICD-10-CM

## 2014-07-30 DIAGNOSIS — I482 Chronic atrial fibrillation, unspecified: Secondary | ICD-10-CM

## 2014-07-30 NOTE — Patient Instructions (Signed)
Your physician recommends that you continue on your current medications as directed. Please refer to the Current Medication list given to you today.  Your physician wants you to follow-up in: 1 year with Dr. Nahser.  You will receive a reminder letter in the mail two months in advance. If you don't receive a letter, please call our office to schedule the follow-up appointment. Your physician recommends that you return for lab work in: 12 months on the day of or a few days before your office visit with Dr. Nahser.  You will need to FAST for this appointment - nothing to eat or drink after midnight the night before except water.   

## 2014-07-30 NOTE — Progress Notes (Signed)
Dennis BongoScott Cortez Date of Birth  12/10/1965       [facility]    [place] [facility] [address]  [address] [place]  [address]   [place]  [id number] [phone]     [phone]   Fax  [phone]    Fax [phone]  Problem List: 1. Atrial Fibrillation 2. Hyperlipidemia  History of Present Illness:  Dennis Cortez is doing well.  Asymptomatic.  His last echo was 3 years ago.  He is working out at [employer] on a regular basis.  He has never had any symptoms related to his A-Fib.  His CHADS score is 0  March 29, 2014:  Dennis Cortez is doing well.  Not working out as much as he would like . Doing well from a cardiac standpoint.  No CP or dyspnea.     July 30, 2014: Dennis Cortez is seen today for followup of his atrial fibrillation. He's been maintained on aspirin because of his low chads Vasc score.  He presented several months after I saw him with some visual disturbances and headache  and was found stroke. He was started on Xarelto at that time.   He feels ok but is still having some headaches.      Current Outpatient Prescriptions on File Prior to Visit  Medication Sig Dispense Refill  . CRESTOR 10 MG tablet TAKE 1 TABLET BY MOUTH EVERY DAY  90 tablet  0  . fish oil-omega-3 fatty acids 1000 MG capsule Take 2 g by mouth daily.        . Multiple Vitamin (MULTIVITAMIN) tablet Take 1 tablet by mouth daily.        Marland Kitchen. omeprazole (PRILOSEC) 20 MG capsule Take 1 capsule (20 mg total) by mouth daily.  90 capsule  3  . rivaroxaban (XARELTO) 20 MG TABS tablet Take 1 tablet (20 mg total) by mouth daily with supper.  90 tablet  3   No current facility-administered medications on file prior to visit.    No Known Allergies  Past Medical History  Diagnosis Date  . Atrial fibrillation   . MVP (mitral valve prolapse)   . Hyperlipidemia   . LVH (left ventricular hypertrophy)     a. Echo 02/2013:  Mild LVH, EF 55-65%, normal wall motion, mild LAE, mild  RVE, mild RAE   . H/O exercise stress test 02/2013    a. ETT-Echo with normal images but significant ST depression and hypotensive response to exercise  . Hx of cardiac catheterization     LHC 03/09/13:  Smooth and normal coronary arteries, normal LVF    Past Surgical History  Procedure Laterality Date  . Knee surgery    . [place] echocardiography  09/01/2008    EF 55-60%  . [place] echocardiography  08/17/2005    EF 55-60%    History  Smoking status  . Never Smoker   Smokeless tobacco  . Not on file    History  Alcohol Use No    Family History  Problem Relation Age of Onset  . Adopted: Yes    Reviw of Systems:  Reviewed in the HPI.  All other systems are negative.  Physical Exam: Blood pressure 100/84, pulse 56, height 6' 1.5" (1.867 m), weight 217 lb (98.431 kg). General: Well developed, well nourished, in no acute distress.  Head: Normocephalic, atraumatic, sclera non-icteric, mucus membranes are moist,   Neck: Supple. Carotids are 2 + without bruits. No JVD  Lungs: Clear  bilaterally to auscultation.  Heart: irregularly irregular.  normal  S1 S2. No significant  murmurs, gallops or rubs.  Abdomen: Soft, non-tender, non-distended with normal bowel sounds. No hepatomegaly. No rebound/guarding. No masses.  Msk:  Strength and tone are normal  Extremities: No clubbing or cyanosis. No edema.  Distal pedal pulses are 2+ and equal bilaterally.  Neuro: Alert and oriented X 3. Moves all extremities spontaneously.  Psych:  Responds to questions appropriately with a normal affect.  ECG:  Assessment / Plan:

## 2014-07-30 NOTE — Assessment & Plan Note (Addendum)
Dennis Cortez is doing okay. He had a small stroke likely due to his atrial fibrillation. He was started on Xarelto at that point.  He continues to have some mild headaches but is otherwise doing fairly well.  He'll continue with same medications.  I will see him  again in one year for followup visit and EKG. We'll check fasting labs at that time.

## 2014-08-19 ENCOUNTER — Ambulatory Visit (INDEPENDENT_AMBULATORY_CARE_PROVIDER_SITE_OTHER): Payer: BC Managed Care – PPO | Admitting: Neurology

## 2014-08-19 ENCOUNTER — Encounter: Payer: Self-pay | Admitting: Neurology

## 2014-08-19 VITALS — BP 128/70 | HR 70 | Temp 98.5°F | Resp 18 | Wt 221.9 lb

## 2014-08-19 DIAGNOSIS — F809 Developmental disorder of speech and language, unspecified: Secondary | ICD-10-CM

## 2014-08-19 DIAGNOSIS — R4789 Other speech disturbances: Secondary | ICD-10-CM

## 2014-08-19 DIAGNOSIS — I4891 Unspecified atrial fibrillation: Secondary | ICD-10-CM

## 2014-08-19 DIAGNOSIS — R51 Headache: Secondary | ICD-10-CM

## 2014-08-19 DIAGNOSIS — I6350 Cerebral infarction due to unspecified occlusion or stenosis of unspecified cerebral artery: Secondary | ICD-10-CM

## 2014-08-19 DIAGNOSIS — I639 Cerebral infarction, unspecified: Secondary | ICD-10-CM

## 2014-08-19 DIAGNOSIS — I480 Paroxysmal atrial fibrillation: Secondary | ICD-10-CM

## 2014-08-19 NOTE — Progress Notes (Signed)
NEUROLOGY FOLLOW UP OFFICE NOTE  Dennis Cortez [id number]  HISTORY OF PRESENT ILLNESS: Dennis Cortez is a 49 year old left-handed man with history of atrial fibrillation (on ASA), MVP, hyperlipidemia, migraine and history of alcohol use who follows up for cardioembolic right occipital stroke.  Records personally reviewed.  UPDATE: MRI of the brain with and without contrast was performed on 05/27/14 and revealed a small acute right occipital infarct.  He was started on Xarelto.  Carotid dopplers performed on 05/26/14 revealed no hemodynamically significant stenosis.    He reports feeling anxious since his diagnosis.  About two months ago, he began experiencing headaches.  They usually occur around the left eye or temple.  He would sometimes feel a heaviness in his left eye.  It is described as a non-throbbing dull ache.  It would last anywhere from a few seconds, to a few minutes or up to an hour and spontaneously resolve.  He sometimes notes tearing of the left eye with it.  It can occur daily.  He has a remote history of migraine.  He would sometimes experience brief episodes of peripheral vision loss involving both eyes.  It would only occur a few seconds and not necessarily with headache.  He has a longstanding history of seeing tiny black spots or "water marks" in his vision and he feels it is more prominent since diagnosed with the stroke.  Also, earlier this week, he reported that he was having a conversation and for about 15 seconds, he couldn't recall the name of a particular word.  There was no problem with language or speech.  HISTORY: On 05/24/14, he had sudden onset of vision loss while driving.  He describes it as left homonymous hemianopsia with slanting up to the right.  The visual disturbance was monocular, involving each eye individually.  It lasted about 45 minutes and resolved.  Afterwards, he developed a right retro-orbital aching pain, lasting about a day.  There was no  associated double vision, unilateral weakness, unilateral numbness, dizziness, or slurred speech.  He saw his ophthalmologist the next day.  His exam was normal.    He reports an episode of visual disturbance that occurred about 2 or 3 years ago.  At that time, he developed hemi-vision loss associated with wavy lines in only his left eye, and lasting 10 to 15 minutes.  It was not associated with headache.  He does report remote history of headaches as a child, in which he had to lay down with a rag over his eyes.  These were associated with nausea.  They resolved after age 64 or 9.  He is adopted so family history is unknown.  He does have a history of atrial fibrillation.  He is on aspirin but not anticoagulation.  Echo from March 2014 revealed mild LVH, EF 55-65%, normal wall motion, mild LAE, mild RVE, mild RAE.  Echo stress test revealed significant ST depression and hypotensive response to exercise.  Cardiac cath revealed normal LVF and normal coronary arteries.  05/26/14 LABS:  Sed Rate 7 08/27/13 LABS:  LDL 80, Hgb A1c 5.2  PAST MEDICAL HISTORY: Past Medical History  Diagnosis Date  . Atrial fibrillation   . MVP (mitral valve prolapse)   . Hyperlipidemia   . LVH (left ventricular hypertrophy)     a. Echo 02/2013:  Mild LVH, EF 55-65%, normal wall motion, mild LAE, mild RVE, mild RAE   . H/O exercise stress test 02/2013    a. ETT-Echo with normal  images but significant ST depression and hypotensive response to exercise  . Hx of cardiac catheterization     LHC 03/09/13:  Smooth and normal coronary arteries, normal LVF    MEDICATIONS: Current Outpatient Prescriptions on File Prior to Visit  Medication Sig Dispense Refill  . CRESTOR 10 MG tablet TAKE 1 TABLET BY MOUTH EVERY DAY  90 tablet  0  . fish oil-omega-3 fatty acids 1000 MG capsule Take 2 g by mouth daily.        . Multiple Vitamin (MULTIVITAMIN) tablet Take 1 tablet by mouth daily.        Marland Kitchen omeprazole (PRILOSEC) 20 MG capsule Take  1 capsule (20 mg total) by mouth daily.  90 capsule  3  . rivaroxaban (XARELTO) 20 MG TABS tablet Take 1 tablet (20 mg total) by mouth daily with supper.  90 tablet  3   No current facility-administered medications on file prior to visit.    ALLERGIES: No Known Allergies  FAMILY HISTORY: Family History  Problem Relation Age of Onset  . Adopted: Yes    SOCIAL HISTORY: History   Social History  . Marital Status: Married    Spouse Name: N/A    Number of Children: N/A  . Years of Education: N/A   Occupational History  . Not on file.   Social History Main Topics  . Smoking status: Never Smoker   . Smokeless tobacco: Not on file  . Alcohol Use: Yes  . Drug Use: No  . Sexual Activity: Yes    Partners: Female   Other Topics Concern  . Not on file   Social History Narrative  . No narrative on file    REVIEW OF SYSTEMS: Constitutional: No fevers, chills, or sweats, no generalized fatigue, change in appetite Eyes: No visual changes, double vision, eye pain Ear, nose and throat: No hearing loss, ear pain, nasal congestion, sore throat Cardiovascular: No chest pain, palpitations Respiratory:  No shortness of breath at rest or with exertion, wheezes GastrointestinaI: No nausea, vomiting, diarrhea, abdominal pain, fecal incontinence Genitourinary:  No dysuria, urinary retention or frequency Musculoskeletal:  No neck pain, back pain Integumentary: No rash, pruritus, skin lesions Neurological: as above Psychiatric: No depression, insomnia, anxiety Endocrine: No palpitations, fatigue, diaphoresis, mood swings, change in appetite, change in weight, increased thirst Hematologic/Lymphatic:  No anemia, purpura, petechiae. Allergic/Immunologic: no itchy/runny eyes, nasal congestion, recent allergic reactions, rashes  PHYSICAL EXAM: Filed Vitals:   08/19/14 0853  BP: 128/70  Pulse: 70  Temp: 98.5 F (36.9 C)  Resp: 18   General: No acute distress Head:   Normocephalic/atraumatic Neck: supple, no paraspinal tenderness, full range of motion Heart:  Regular rate and rhythm Lungs:  Clear to auscultation bilaterally Back: No paraspinal tenderness Neurological Exam: alert and oriented to person, place, and time. Attention span and concentration intact, recent and remote memory intact, fund of knowledge intact.  Speech fluent and not dysarthric, language intact.  CN II-XII intact. Fundoscopic exam unremarkable without vessel changes, exudates, hemorrhages or papilledema.  Bulk and tone normal, muscle strength 5/5 throughout.  Sensation to light touch, temperature and vibration intact.  Deep tendon reflexes 2+ throughout, toes downgoing.  Finger to nose and heel to shin testing intact.  Gait normal, Romberg negative.  IMPRESSION: 1.  Right occipital ischemic infarct, likely cardioembolic secondary to atrial fibrillation 2.  Headache.  It really does not follow a particular headache syndrome.   3.  Brief episode of word-finding difficulty.  I don't think this is  pathological.  I think he simply could not recall one particular word and it only lasted a few seconds.  He exhibited no other symptoms to suggest aphasia.  I think he is being hypervigilant and anxious.  PLAN: 1.  I recommended starting an antidepressant such as amitriptyline or nortriptyline to reduce frequency of the headaches.  He says it really doesn't bother him that much.  I told him to call if the headaches get worse or if he experiences any focal deficits. 2.  Continue Xarelto for secondary stroke prevention 3.  Follow up in 3 months.  Shon Millet, DO  CC:  Kriste Basque, DO

## 2014-08-19 NOTE — Patient Instructions (Signed)
1.  Continue the Xarelto 2.  If headaches worsen or become more bothersome, please call 3.  Follow up in 3 months.

## 2014-10-01 ENCOUNTER — Other Ambulatory Visit: Payer: Self-pay | Admitting: Family Medicine

## 2014-11-18 ENCOUNTER — Encounter: Payer: Self-pay | Admitting: Neurology

## 2014-11-18 ENCOUNTER — Ambulatory Visit (INDEPENDENT_AMBULATORY_CARE_PROVIDER_SITE_OTHER): Payer: BC Managed Care – PPO | Admitting: Neurology

## 2014-11-18 VITALS — BP 132/68 | HR 68 | Temp 98.3°F | Resp 16 | Ht 74.0 in | Wt 223.9 lb

## 2014-11-18 DIAGNOSIS — I639 Cerebral infarction, unspecified: Secondary | ICD-10-CM

## 2014-11-18 DIAGNOSIS — G8929 Other chronic pain: Secondary | ICD-10-CM

## 2014-11-18 DIAGNOSIS — M79674 Pain in right toe(s): Secondary | ICD-10-CM

## 2014-11-18 DIAGNOSIS — I4891 Unspecified atrial fibrillation: Secondary | ICD-10-CM

## 2014-11-18 DIAGNOSIS — R413 Other amnesia: Secondary | ICD-10-CM

## 2014-11-18 NOTE — Patient Instructions (Signed)
Continue the Xarelto Memory problems are likely related to stress and pre-occupation but will retest in 6 months. Follow up in 6 months.

## 2014-11-18 NOTE — Progress Notes (Signed)
NEUROLOGY FOLLOW UP OFFICE NOTE  Dennis Cortez [id number]  HISTORY OF PRESENT ILLNESS: Dennis Cortez is a 48 year old left-handed man with atrial fibrillation (on Xarelto), MVP, hyperlipidemia, migraine and history of alcohol use who follows up for headache and cardioembolic right occipital stroke.  UPDATE: He is doing well.  He had formal vision testing 2 weeks ago and his vision is for the most part normal (field cut has resolved).  Headaches have improved.  He still notes a little memory issues.  He says he has repeated conversations and occasionally has word-finding difficulties.  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  Incidentally, he mentions pain and tingling in the big toe in his left foot.  It has been ongoing for two years.  At that time, he saw a podiatrist who told him he had an "extra bone" in the toe.  It isn't really that painful but he wanted to mention it.   HISTORY: On 05/24/14, he had sudden onset of vision loss while driving.  He describes it as left homonymous hemianopsia with slanting up to the right.  The visual disturbance was monocular, involving each eye individually.  It lasted about 45 minutes and resolved.  Afterwards, he developed a right retro-orbital aching pain, lasting about a day.  There was no associated double vision, unilateral weakness, unilateral numbness, dizziness, or slurred speech.  He saw his ophthalmologist the next day.  His exam was normal.    He reports an episode of visual disturbance that occurred about 2 or 3 years ago.  At that time, he developed hemi-vision loss associated with wavy lines in only his left eye, and lasting 10 to 15 minutes.  It was not associated with headache.  He does report remote history of headaches as a child, in which he had to lay down with a rag over his eyes.  These were associated with nausea.  They resolved after age 36 or 48.  He is  adopted so family history is unknown.  MRI of the brain with and without contrast was performed on 05/27/14 and revealed a small acute right occipital infarct.  He was started on Xarelto.  Carotid dopplers performed on 05/26/14 revealed no hemodynamically significant stenosis.    He does have a history of atrial fibrillation.  He is on aspirin but not anticoagulation.  Echo from March 2014 revealed mild LVH, EF 55-65%, normal wall motion, mild LAE, mild RVE, mild RAE.  Echo stress test revealed significant ST depression and hypotensive response to exercise.  Cardiac cath revealed normal LVF and normal coronary arteries.  Since the stroke, he had been feeling anxious and experiencing headaches.  They usually occur around the left eye or temple.  He would sometimes feel a heaviness in his left eye.  It is described as a non-throbbing dull ache.  It would last anywhere from a few seconds, to a few minutes or up to an hour and spontaneously resolve.  He sometimes notes tearing of the left eye with it.  It can occur daily.  He has a remote history of migraine.  He would sometimes experience brief episodes of peripheral vision loss involving both eyes.  It would only occur a few seconds and not necessarily with headache.  He has a longstanding history of seeing tiny black spots or "water marks" in his vision and he feels it is more prominent since diagnosed with the stroke.  There was no problem with language or speech.  05/26/14 LABS:  Sed Rate 7 08/27/13 LABS:  LDL 80, Hgb A1c 5.2  PAST MEDICAL HISTORY: Past Medical History  Diagnosis Date  . Atrial fibrillation   . MVP (mitral valve prolapse)   . Hyperlipidemia   . LVH (left ventricular hypertrophy)     a. Echo 02/2013:  Mild LVH, EF 55-65%, normal wall motion, mild LAE, mild RVE, mild RAE   . H/O exercise stress test 02/2013    a. ETT-Echo with normal images but significant ST depression and hypotensive response to exercise  . Hx of cardiac  catheterization     LHC 03/09/13:  Smooth and normal coronary arteries, normal LVF    MEDICATIONS: Current Outpatient Prescriptions on File Prior to Visit  Medication Sig Dispense Refill  . CRESTOR 10 MG tablet TAKE 1 TABLET BY MOUTH EVERY DAY 90 tablet 0  . fish oil-omega-3 fatty acids 1000 MG capsule Take 2 g by mouth daily.      . Multiple Vitamin (MULTIVITAMIN) tablet Take 1 tablet by mouth daily.      Marland Kitchen omeprazole (PRILOSEC) 20 MG capsule TAKE 1 CAPSULE EVERY DAY 90 capsule 1  . rivaroxaban (XARELTO) 20 MG TABS tablet Take 1 tablet (20 mg total) by mouth daily with supper. 90 tablet 3   No current facility-administered medications on file prior to visit.    ALLERGIES: No Known Allergies  FAMILY HISTORY: Family History  Problem Relation Age of Onset  . Adopted: Yes    SOCIAL HISTORY: History   Social History  . Marital Status: Married    Spouse Name: N/A    Number of Children: N/A  . Years of Education: N/A   Occupational History  . Not on file.   Social History Main Topics  . Smoking status: Never Smoker   . Smokeless tobacco: Never Used  . Alcohol Use: 0.0 oz/week    0 Not specified per week     Comment: social  . Drug Use: No  . Sexual Activity:    Partners: Female   Other Topics Concern  . Not on file   Social History Narrative    REVIEW OF SYSTEMS: Constitutional: No fevers, chills, or sweats, no generalized fatigue, change in appetite Eyes: No visual changes, double vision, eye pain Ear, nose and throat: No hearing loss, ear pain, nasal congestion, sore throat Cardiovascular: No chest pain, palpitations Respiratory:  No shortness of breath at rest or with exertion, wheezes GastrointestinaI: No nausea, vomiting, diarrhea, abdominal pain, fecal incontinence Genitourinary:  No dysuria, urinary retention or frequency Musculoskeletal:  No neck pain, back pain Integumentary: No rash, pruritus, skin lesions Neurological: as above Psychiatric: No  depression, insomnia, anxiety Endocrine: No palpitations, fatigue, diaphoresis, mood swings, change in appetite, change in weight, increased thirst Hematologic/Lymphatic:  No anemia, purpura, petechiae. Allergic/Immunologic: no itchy/runny eyes, nasal congestion, recent allergic reactions, rashes  PHYSICAL EXAM: Filed Vitals:   11/18/14 0751  BP: 132/68  Pulse: 68  Temp: 98.3 F (36.8 C)  Resp: 16   General: No acute distress Head:  Normocephalic/atraumatic Eyes:  Fundoscopic exam unremarkable without vessel changes, exudates, hemorrhages or papilledema. Neck: supple, no paraspinal tenderness, full range of motion Heart:  Regular rate and rhythm Lungs:  Clear to auscultation bilaterally Back: No paraspinal tenderness Neurological Exam: alert and oriented to person, place, and time. Attention span and concentration intact, recent and remote memory intact, fund of knowledge intact. He exhibited minor deficits on MOCA.  He performed  the trail making test correctly up until the last part.  Naming fluency was limited to 8 words in one minute.  He did not repeat numbers correctly in reverse order and he missed one of five words after 5 minutes.  Montreal Cognitive Assessment  11/18/2014  Visuospatial/ Executive (0/5) 4  Naming (0/3) 3  Attention: Read list of digits (0/2) 1  Attention: Read list of letters (0/1) 1  Attention: Serial 7 subtraction starting at 100 (0/3) 3  Language: Repeat phrase (0/2) 2  Language : Fluency (0/1) 0  Abstraction (0/2) 2  Delayed Recall (0/5) 4  Orientation (0/6) 6  Total 26  Adjusted Score (based on education) 26   Speech fluent and not dysarthric, language intact.  CN II-XII intact. Fundoscopic exam unremarkable without vessel changes, exudates, hemorrhages or papilledema.  Bulk and tone normal, muscle strength 5/5 throughout.  Sensation to light touch, temperature and vibration intact.  Deep tendon reflexes 2+ throughout, toes downgoing.  Finger to nose  and heel to shin testing intact.  Gait normal, Romberg negative.  IMPRESSION: Right occipital ischemic infarct, likely cardioembolic secondary to atrial fibrillation Memory problems.  Deficits on MOCA are minor and can be attributed to not focusing.  I don't suspect any actual cognitive impairment at this point.   Left big toe pain.  Chronic.  Could be neuralgia.  PLAN: Continue Xarelto If toe pain increases, advised to see podiatrist again. Follow up in 6 months and will repeat MOCA  25 minutes spent with patient, over 50% spent discussing management.  Shon MilletAdam Xhaiden Coombs, DO  CC: Kriste BasqueHannah Kim, DO

## 2014-12-30 ENCOUNTER — Other Ambulatory Visit: Payer: Self-pay | Admitting: Family Medicine

## 2015-03-29 ENCOUNTER — Other Ambulatory Visit: Payer: Self-pay | Admitting: Family Medicine

## 2015-03-31 ENCOUNTER — Other Ambulatory Visit: Payer: Self-pay | Admitting: Family Medicine

## 2015-05-17 ENCOUNTER — Other Ambulatory Visit: Payer: Self-pay | Admitting: Cardiovascular Disease

## 2015-05-19 ENCOUNTER — Ambulatory Visit (INDEPENDENT_AMBULATORY_CARE_PROVIDER_SITE_OTHER): Payer: BLUE CROSS/BLUE SHIELD | Admitting: Neurology

## 2015-05-19 ENCOUNTER — Encounter: Payer: Self-pay | Admitting: Neurology

## 2015-05-19 VITALS — BP 120/70 | Resp 67 | Ht 74.0 in | Wt 225.7 lb

## 2015-05-19 DIAGNOSIS — I4891 Unspecified atrial fibrillation: Secondary | ICD-10-CM | POA: Diagnosis not present

## 2015-05-19 DIAGNOSIS — G43009 Migraine without aura, not intractable, without status migrainosus: Secondary | ICD-10-CM

## 2015-05-19 DIAGNOSIS — F411 Generalized anxiety disorder: Secondary | ICD-10-CM

## 2015-05-19 DIAGNOSIS — I639 Cerebral infarction, unspecified: Secondary | ICD-10-CM | POA: Diagnosis not present

## 2015-05-19 HISTORY — DX: Migraine without aura, not intractable, without status migrainosus: G43.009

## 2015-05-19 HISTORY — DX: Generalized anxiety disorder: F41.1

## 2015-05-19 HISTORY — DX: Cerebral infarction, unspecified: I63.9

## 2015-05-19 MED ORDER — VENLAFAXINE HCL ER 37.5 MG PO CP24: ORAL_CAPSULE | ORAL | Status: DC

## 2015-05-19 NOTE — Patient Instructions (Signed)
Migraine Recommendations: 1.  Start venlafaxine ER 37.5mg  daily for 7 days, then increase to 75mg  (two capsules) daily.  Call in 4 weeks with update and we can adjust dose if needed. 2.  Take Excedrin Tension (acetaminophen and caffeine) at earliest onset of headache.  3.  Limit use of pain relievers to no more than 2 days out of the week.  These medications include acetaminophen, ibuprofen, triptans and narcotics.  This will help reduce risk of rebound headaches. 4.  Be aware of common food triggers such as processed sweets, processed foods with nitrites (such as deli meat, hot dogs, sausages), foods with MSG, alcohol (such as wine), chocolate, certain cheeses, certain fruits (dried fruits, some citrus fruit), vinegar, diet soda. 4.  Avoid caffeine 5.  Routine exercise 6.  Proper sleep hygiene 7.  Stay adequately hydrated with water 8.  Keep a headache diary. 9.  Maintain proper stress management. 10.  Will refer you to behavioral medicine 11.  Follow up in 3 months.

## 2015-05-19 NOTE — Progress Notes (Addendum)
NEUROLOGY FOLLOW UP OFFICE NOTE  Dennis Cortez [id number]  HISTORY OF PRESENT ILLNESS: Dennis Cortez is a 49 year old left-handed man with atrial fibrillation (on Xarelto), MVP, hyperlipidemia, migraine and history of alcohol use who follows up for headache and cardioembolic right occipital stroke.  UPDATE: Over the past couple of months, he has had recurrence in the headaches.  Again, they are left retro-orbital, 4/10, and sometimes associated with nausea and photophobia.  They can last anywhere from minutes to several hours.  They occur 3 to 4 days per week.  He has not had any visual symptoms associated with them.  He takes an unknown pain reliever which helps.  He is unable to take ASA or NSAIDs as per cardiology.  Short-term memory problems are limited to sometimes forgetting conversations and will say something twice to a person.  He notes significant anxiety over the past couple of years.  He is always is worried about what he needs to do.  He is worried about work or obsesses about appointments.  He says he dreads going to bed.  When something good happens, he worries that something bad will occur the next day.  He has problems sleeping.  HISTORY: On 05/24/14, he had sudden onset of vision loss while driving.  He describes it as left homonymous hemianopsia with slanting up to the right.  The visual disturbance was monocular, involving each eye individually.  It lasted about 45 minutes and resolved.  Afterwards, he developed a right retro-orbital aching pain, lasting about a day.  There was no associated double vision, unilateral weakness, unilateral numbness, dizziness, or slurred speech.  MRI of the brain with and without contrast revealed a small acute right occipital infarct.  Carotid dopplers revealed no hemodynamically significant stenosis.  He was started on Xarelto.  Visual field cut resolved the next day.    He reports an episode of visual disturbance that occurred about 2 or 3  years ago.  At that time, he developed hemi-vision loss associated with wavy lines in only his left eye, and lasting 10 to 15 minutes.  It was not associated with headache.  He does report remote history of headaches as a child, in which he had to lay down with a rag over his eyes.  These were associated with nausea.  They resolved after age 87 or 89.  He is adopted so family history is unknown.  He does have a history of atrial fibrillation.  He is on aspirin but not anticoagulation.  Echo from March 2014 revealed mild LVH, EF 55-65%, normal wall motion, mild LAE, mild RVE, mild RAE.  Echo stress test revealed significant ST depression and hypotensive response to exercise.  Cardiac cath revealed normal LVF and normal coronary arteries.  He notes memory issues.  He says he has repeated conversations and occasionally has word-finding difficulties.  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    PAST MEDICAL HISTORY: Past Medical History  Diagnosis Date  . Atrial fibrillation   . MVP (mitral valve prolapse)   . Hyperlipidemia   . LVH (left ventricular hypertrophy)     a. Echo 02/2013:  Mild LVH, EF 55-65%, normal wall motion, mild LAE, mild RVE, mild RAE   . H/O exercise stress test 02/2013    a. ETT-Echo with normal images but significant ST depression and hypotensive response to exercise  . Hx of cardiac catheterization  LHC 03/09/13:  Smooth and normal coronary arteries, normal LVF    MEDICATIONS: Current Outpatient Prescriptions on File Prior to Visit  Medication Sig Dispense Refill  . CRESTOR 10 MG tablet TAKE 1 TABLET BY MOUTH EVERY DAY 90 tablet 0  . fish oil-omega-3 fatty acids 1000 MG capsule Take 2 g by mouth daily.      . Multiple Vitamin (MULTIVITAMIN) tablet Take 1 tablet by mouth daily.      Marland Kitchen omeprazole (PRILOSEC) 20 MG capsule TAKE ONE CAPSULE BY MOUTH EVERY DAY 90 capsule 0  . XARELTO 20 MG TABS tablet  TAKE 1 TABLET BY MOUTH EVERY DAY WITH SUPPER 90 tablet 1   No current facility-administered medications on file prior to visit.    ALLERGIES: No Known Allergies  FAMILY HISTORY: Family History  Problem Relation Age of Onset  . Adopted: Yes    SOCIAL HISTORY: History   Social History  . Marital Status: Married    Spouse Name: N/A  . Number of Children: N/A  . Years of Education: N/A   Occupational History  . Not on file.   Social History Main Topics  . Smoking status: Never Smoker   . Smokeless tobacco: Never Used  . Alcohol Use: 0.0 oz/week    0 Standard drinks or equivalent per week     Comment: social  . Drug Use: No  . Sexual Activity:    Partners: Female   Other Topics Concern  . Not on file   Social History Narrative    REVIEW OF SYSTEMS: Constitutional: No fevers, chills, or sweats, no generalized fatigue, change in appetite Eyes: No visual changes, double vision, eye pain Ear, nose and throat: No hearing loss, ear pain, nasal congestion, sore throat Cardiovascular: No chest pain, palpitations Respiratory:  No shortness of breath at rest or with exertion, wheezes GastrointestinaI: No nausea, vomiting, diarrhea, abdominal pain, fecal incontinence Genitourinary:  No dysuria, urinary retention or frequency Musculoskeletal:  No neck pain, back pain Integumentary: No rash, pruritus, skin lesions Neurological: as above Psychiatric: nsomnia, anxiety Endocrine: No palpitations, fatigue, diaphoresis, mood swings, change in appetite, change in weight, increased thirst Hematologic/Lymphatic:  No anemia, purpura, petechiae. Allergic/Immunologic: no itchy/runny eyes, nasal congestion, recent allergic reactions, rashes  PHYSICAL EXAM: Filed Vitals:   05/19/15 0838  BP: 120/70  Resp: 67   General: No acute distress Head:  Normocephalic/atraumatic Eyes:  Fundoscopic exam unremarkable without vessel changes, exudates, hemorrhages or papilledema. Neck: supple,  no paraspinal tenderness, full range of motion Heart:  Regular rate and rhythm Lungs:  Clear to auscultation bilaterally Back: No paraspinal tenderness Neurological Exam: alert and oriented to person, place, and time. Attention span and concentration intact, recent and remote memory intact, fund of knowledge intact.  Speech fluent and not dysarthric, language intact.   Montreal Cognitive Assessment  05/19/2015 11/18/2014  Visuospatial/ Executive (0/5) 5 4  Naming (0/3) 3 3  Attention: Read list of digits (0/2) 2 1  Attention: Read list of letters (0/1) 1 1  Attention: Serial 7 subtraction starting at 100 (0/3) 3 3  Language: Repeat phrase (0/2) 2 2  Language : Fluency (0/1) 1 0  Abstraction (0/2) 2 2  Delayed Recall (0/5) 4 4  Orientation (0/6) 6 6  Total 29 26  Adjusted Score (based on education) 29 26   CN II-XII intact. Fundoscopic exam unremarkable without vessel changes, exudates, hemorrhages or papilledema.  Bulk and tone normal, muscle strength 5/5 throughout.  Sensation to light touch, temperature and vibration intact.  Deep tendon reflexes 3+ throughout, toes downgoing.  Finger to nose and heel to shin testing intact.  Gait normal, Romberg negative.  IMPRESSION: Migraine without aura Right occipital ischemic infarct, possibly cardioembolic but may have been related to migraine Memory difficulty.  No cognitive impairment appreciated. Anxiety, contributing to headache and memory problems  PLAN: 1.  Will refer to [facility] for treatment of anxiety which is playing a significant role in memory and headaches.  Cognitive behavioral therapy may be appropriate. 2.  Start venlafaxine ER 37.5mg  daily for 7 days, then increase to  daily (for headaches and anxiety) 3.  Try Excedrin Tension (without ASA) for headache 4.  Even though the stroke may possibly have been related to migraine, we cannot be sure it was not cardioembolic, so I would continue Xarelto. 5.  Sleep  hygiene tips 6.  Follow up in 3 months.  27 minutes spent with patient face to face, over 50% spent discussing diagnosis and plan.  Shon Millet, DO  CC: Kriste Basque, DO

## 2015-05-23 NOTE — Addendum Note (Signed)
Addended by: Marlane Hatcher C on: 05/23/2015 11:27 AM   Modules accepted: Orders

## 2015-06-12 ENCOUNTER — Other Ambulatory Visit: Payer: Self-pay | Admitting: Neurology

## 2015-06-15 ENCOUNTER — Other Ambulatory Visit: Payer: Self-pay | Admitting: Neurology

## 2015-06-26 ENCOUNTER — Other Ambulatory Visit: Payer: Self-pay | Admitting: Family Medicine

## 2015-07-11 ENCOUNTER — Other Ambulatory Visit: Payer: Self-pay | Admitting: Family Medicine

## 2015-07-26 ENCOUNTER — Other Ambulatory Visit: Payer: Self-pay | Admitting: Family Medicine

## 2015-08-01 ENCOUNTER — Telehealth: Payer: Self-pay | Admitting: Family Medicine

## 2015-08-01 NOTE — Telephone Encounter (Signed)
Pt scheduled cpe for 09/07/15. However, pt would like a 90 day supply (all his insurance will cover), of omeprazole (PRILOSEC) 20 MG capsule  Sent to [facility]  Pt travels for work most of the time and this was first available he could do. Pt gets labs at cardiologist on 9/19

## 2015-08-02 MED ORDER — OMEPRAZOLE 20 MG PO CPDR: 20.0000 mg | DELAYED_RELEASE_CAPSULE | Freq: Every day | ORAL | Status: DC

## 2015-08-24 ENCOUNTER — Telehealth: Payer: Self-pay | Admitting: *Deleted

## 2015-08-24 NOTE — Telephone Encounter (Signed)
UNABLE TO REACH PT TO GET FAMILY HX AND STATUS.  

## 2015-08-29 ENCOUNTER — Other Ambulatory Visit (INDEPENDENT_AMBULATORY_CARE_PROVIDER_SITE_OTHER): Payer: BLUE CROSS/BLUE SHIELD | Admitting: *Deleted

## 2015-08-29 ENCOUNTER — Ambulatory Visit (INDEPENDENT_AMBULATORY_CARE_PROVIDER_SITE_OTHER): Payer: BLUE CROSS/BLUE SHIELD | Admitting: Cardiovascular Disease

## 2015-08-29 ENCOUNTER — Encounter: Payer: Self-pay | Admitting: Cardiovascular Disease

## 2015-08-29 VITALS — BP 110/70 | HR 64 | Ht 74.0 in | Wt 224.8 lb

## 2015-08-29 DIAGNOSIS — I4891 Unspecified atrial fibrillation: Secondary | ICD-10-CM

## 2015-08-29 DIAGNOSIS — I482 Chronic atrial fibrillation, unspecified: Secondary | ICD-10-CM

## 2015-08-29 DIAGNOSIS — E785 Hyperlipidemia, unspecified: Secondary | ICD-10-CM

## 2015-08-29 LAB — LIPID PANEL
Cholesterol: 180 mg/dL (ref 0–200)
HDL: 60.1 mg/dL (ref >39.00)
LDL CALC: 103 mg/dL — AB (ref 0–99)
NonHDL: 119.82
TRIGLYCERIDES: 83 mg/dL (ref 0.0–149.0)
Total CHOL/HDL Ratio: 3
VLDL: 16.6 mg/dL (ref 0.0–40.0)

## 2015-08-29 LAB — HEPATIC FUNCTION PANEL
ALK PHOS: 62 U/L (ref 39–117)
ALT: 24 U/L (ref 0–53)
AST: 23 U/L (ref 0–37)
Albumin: 4.4 g/dL (ref 3.5–5.2)
BILIRUBIN DIRECT: 0.1 mg/dL (ref 0.0–0.3)
BILIRUBIN TOTAL: 0.6 mg/dL (ref 0.2–1.2)
Total Protein: 7.2 g/dL (ref 6.0–8.3)

## 2015-08-29 LAB — BASIC METABOLIC PANEL
BUN: 18 mg/dL (ref 6–23)
CALCIUM: 9.3 mg/dL (ref 8.4–10.5)
CO2: 25 mEq/L (ref 19–32)
CREATININE: 1.1 mg/dL (ref 0.40–1.50)
Chloride: 105 mEq/L (ref 96–112)
GFR: 75.12 mL/min (ref >60.00)
Glucose, Bld: 89 mg/dL (ref 70–99)
Potassium: 4.2 mEq/L (ref 3.5–5.1)
Sodium: 138 mEq/L (ref 135–145)

## 2015-08-29 NOTE — Addendum Note (Signed)
Addended by: Tonita Phoenix on: 08/29/2015 09:29 AM   Modules accepted: Orders

## 2015-08-29 NOTE — Progress Notes (Signed)
Dennis Cortez Date of Birth  01-29-1966       [facility]    [place] [address]  [address] [place], [place]  [address]   [place], [place]  [address] [phone]     [phone]   Fax  [phone]    Fax [phone]  Problem List: 1. Atrial Fibrillation 2. Hyperlipidemia  History of Present Illness:  Dennis Cortez is doing well.  Asymptomatic.  His last echo was 3 years ago.  He is working out at [employer] on a regular basis.  He has never had any symptoms related to his A-Fib.  His CHADS score is 0  March 29, 2014:  Dennis Cortez is doing well.  Not working out as much as he would like . Doing well from a cardiac standpoint.  No CP or dyspnea.     July 30, 2014: Dennis Cortez is seen today for followup of his atrial fibrillation. He's been maintained on aspirin because of his low chads Vasc score.  He presented several months after I saw him with some visual disturbances and headache  and was found stroke. He was started on Xarelto at that time.   He feels ok but is still having some headaches.    Sept. 19, 2016:    Doing well.  Has been walking.     No CP or dyspnea.     Current Outpatient Prescriptions on File Prior to Visit  Medication Sig Dispense Refill  . CRESTOR 10 MG tablet TAKE 1 TABLET BY MOUTH EVERY DAY 90 tablet 0  . fish oil-omega-3 fatty acids 1000 MG capsule Take 2 g by mouth daily.      . Multiple Vitamin (MULTIVITAMIN) tablet Take 1 tablet by mouth daily.      Marland Kitchen omeprazole (PRILOSEC) 20 MG capsule Take 1 capsule (20 mg total) by mouth daily. 90 capsule 0  . XARELTO 20 MG TABS tablet TAKE 1 TABLET BY MOUTH EVERY DAY WITH SUPPER 90 tablet 1   No current facility-administered medications on file prior to visit.    No Known Allergies  Past Medical History  Diagnosis Date  . Atrial fibrillation   . MVP (mitral valve prolapse)   . Hyperlipidemia   . LVH (left ventricular hypertrophy)     a. Echo 02/2013:  Mild LVH, EF  55-65%, normal wall motion, mild LAE, mild RVE, mild RAE   . H/O exercise stress test 02/2013    a. ETT-Echo with normal images but significant ST depression and hypotensive response to exercise  . Hx of cardiac catheterization     LHC 03/09/13:  Smooth and normal coronary arteries, normal LVF    Past Surgical History  Procedure Laterality Date  . Knee surgery    . US echocardiography  09/01/2008    EF 55-60%  . US echocardiography  08/17/2005    EF 55-60%    History  Smoking status  . Never Smoker   Smokeless tobacco  . Never Used    History  Alcohol Use  . 0.0 oz/week  . 0 Standard drinks or equivalent per week    Comment: social    Family History  Problem Relation Age of Onset  . Adopted: Yes    Reviw of Systems:  Reviewed in the HPI.  All other systems are negative.  Physical Exam: Blood pressure 110/70, pulse 64, height  (1.88 m), weight 101.969 kg (224 lb 12.8 oz). General: Well developed, well nourished, in no  acute distress.  Head: Normocephalic, atraumatic, sclera non-icteric, mucus membranes are moist,   Neck: Supple. Carotids are 2 + without bruits. No JVD  Lungs: Clear bilaterally to auscultation.  Heart: irregularly irregular.  normal  S1 S2. No significant  murmurs, gallops or rubs.  Abdomen: Soft, non-tender, non-distended with normal bowel sounds. No hepatomegaly. No rebound/guarding. No masses.  Msk:  Strength and tone are normal  Extremities: No clubbing or cyanosis. No edema.  Distal pedal pulses are 2+ and equal bilaterally.  Neuro: Alert and oriented X 3. Moves all extremities spontaneously.  Psych:  Responds to questions appropriately with a normal affect.  ECG: Sept. 19, 2016:  Atrial fib at 25. NS IVCD   Assessment / Plan:   1. Atrial Fibrillation  - has chronic afib .  Rate is well controlled.   2. Hyperlipidemia - labs checked today .  Continue current meds.    3. CVA:  had a CVA before he was on Xarelto.  Continue  Xarelto lifelong .     Nahser, Deloris Ping, MD  08/29/2015 10:14 AM    [facility] [address] [place],  [address] [place], [place]  [address] Pager [phone] Phone: [phone]; Fax: [phone]   [facility]  [address] [place], [place]  [address] [phone]   Fax [phone]

## 2015-08-29 NOTE — Addendum Note (Signed)
Addended by: BOWDEN, ROBIN K on: 08/29/2015 09:29 AM   Modules accepted: Orders  

## 2015-08-29 NOTE — Patient Instructions (Signed)

## 2015-09-02 ENCOUNTER — Other Ambulatory Visit: Payer: Self-pay | Admitting: Family Medicine

## 2015-09-05 ENCOUNTER — Other Ambulatory Visit: Payer: Self-pay | Admitting: *Deleted

## 2015-09-05 MED ORDER — OMEPRAZOLE 20 MG PO CPDR: 20.0000 mg | DELAYED_RELEASE_CAPSULE | Freq: Every day | ORAL | Status: DC

## 2015-09-05 MED ORDER — ROSUVASTATIN CALCIUM 10 MG PO TABS: 10.0000 mg | ORAL_TABLET | Freq: Every day | ORAL | Status: DC

## 2015-09-05 NOTE — Telephone Encounter (Signed)
Rx done and the pt was informed via Mychart message. 

## 2015-09-07 ENCOUNTER — Ambulatory Visit (INDEPENDENT_AMBULATORY_CARE_PROVIDER_SITE_OTHER): Payer: BLUE CROSS/BLUE SHIELD | Admitting: Neurology

## 2015-09-07 ENCOUNTER — Encounter: Payer: Self-pay | Admitting: Neurology

## 2015-09-07 VITALS — BP 126/84 | HR 66 | Ht 74.0 in | Wt 222.0 lb

## 2015-09-07 DIAGNOSIS — F419 Anxiety disorder, unspecified: Secondary | ICD-10-CM | POA: Diagnosis not present

## 2015-09-07 DIAGNOSIS — I639 Cerebral infarction, unspecified: Secondary | ICD-10-CM | POA: Diagnosis not present

## 2015-09-07 DIAGNOSIS — G43009 Migraine without aura, not intractable, without status migrainosus: Secondary | ICD-10-CM | POA: Diagnosis not present

## 2015-09-07 NOTE — Patient Instructions (Signed)
1.  Continue venlafaxine ER 75mg  daily 2.  Continue Xarelto 3.  Follow up in one year or as needed.
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
## 2015-09-09 ENCOUNTER — Ambulatory Visit (INDEPENDENT_AMBULATORY_CARE_PROVIDER_SITE_OTHER): Payer: BLUE CROSS/BLUE SHIELD | Admitting: Family Medicine

## 2015-09-09 ENCOUNTER — Encounter: Payer: Self-pay | Admitting: Family Medicine

## 2015-09-09 VITALS — BP 102/80 | HR 66 | Temp 98.2°F | Ht 74.0 in | Wt 225.1 lb

## 2015-09-09 DIAGNOSIS — Z1211 Encounter for screening for malignant neoplasm of colon: Secondary | ICD-10-CM | POA: Diagnosis not present

## 2015-09-09 DIAGNOSIS — I4891 Unspecified atrial fibrillation: Secondary | ICD-10-CM

## 2015-09-09 DIAGNOSIS — Z0000 Encounter for general adult medical examination without abnormal findings: Secondary | ICD-10-CM | POA: Diagnosis not present

## 2015-09-09 DIAGNOSIS — I639 Cerebral infarction, unspecified: Secondary | ICD-10-CM

## 2015-09-09 DIAGNOSIS — G43009 Migraine without aura, not intractable, without status migrainosus: Secondary | ICD-10-CM

## 2015-09-09 NOTE — Progress Notes (Signed)
HPI:  Dennis Cortez is a pleasant 49 yo M with a PMH significant for stroke, A. Fib, migraine, GERD, HLD and anxiety whom has not been here is some time, here today for a CPE. He sees a cardiologist and a neurologist and has seen them bother earlier this month. His cardiologist did labwork recently - reviewed. He reports he has been feeling great and is without any complaints today. He is getting exercise a few days per week and reports is eating a healthy diet.  -sexual activity: yes, male partner, no new partners  -wants STI testing, Hep C screening (if born 48-1966): no  -FH colon or prstate ca: see FH Last colon cancer screening: due - discussed, wants to do colonoscopy Last prostate ca screening: done, he declined for now after discussion risks/benefits  -Alcohol, Tobacco, drug use: see social history  Review of Systems - no fevers, unintentional weight loss, vision loss, hearing loss, chest pain, sob, hemoptysis, melena, hematochezia, hematuria, genital discharge, changing or concerning skin lesions, bleeding, bruising, loc, thoughts of self harm or SI  Past Medical History  Diagnosis Date  . Atrial fibrillation   . MVP (mitral valve prolapse)   . Hyperlipidemia   . LVH (left ventricular hypertrophy)     a. Echo 02/2013:  Mild LVH, EF 55-65%, normal wall motion, mild LAE, mild RVE, mild RAE   . H/O exercise stress test 02/2013    a. ETT-Echo with normal images but significant ST depression and hypotensive response to exercise  . Hx of cardiac catheterization     LHC 03/09/13:  Smooth and normal coronary arteries, normal LVF    Past Surgical History  Procedure Laterality Date  . Knee surgery    . US echocardiography  09/01/2008    EF 55-60%  . US echocardiography  08/17/2005    EF 55-60%    Family History  Problem Relation Age of Onset  . Adopted: Yes    Social History   Social History  . Marital Status: Married    Spouse Name: N/A  . Number of Children: N/A  .  Years of Education: N/A   Social History Main Topics  . Smoking status: Never Smoker   . Smokeless tobacco: Never Used  . Alcohol Use: 0.0 oz/week    0 Standard drinks or equivalent per week     Comment: social  . Drug Use: No  . Sexual Activity:    Partners: Female   Other Topics Concern  . None   Social History Narrative     Current outpatient prescriptions:  .  fish oil-omega-3 fatty acids 1000 MG capsule, Take 2 g by mouth daily.  , Disp: , Rfl:  .  Multiple Vitamin (MULTIVITAMIN) tablet, Take 1 tablet by mouth daily.  , Disp: , Rfl:  .  omeprazole (PRILOSEC) 20 MG capsule, Take 1 capsule (20 mg total) by mouth daily., Disp: 30 capsule, Rfl: 0 .  rosuvastatin (CRESTOR) 10 MG tablet, Take 1 tablet (10 mg total) by mouth daily., Disp: 30 tablet, Rfl: 0 .  venlafaxine XR (EFFEXOR-XR) 75 MG 24 hr capsule, Take 75 mg by mouth daily., Disp: , Rfl: 2 .  XARELTO 20 MG TABS tablet, TAKE 1 TABLET BY MOUTH EVERY DAY WITH SUPPER, Disp: 90 tablet, Rfl: 1  EXAM:  Filed Vitals:   09/09/15 1429  BP: 102/80  Pulse: 66  Temp: 98.2 F (36.8 C)  TempSrc: Oral  Height:  (1.88 m)  Weight: 225 lb 1.6 oz (102.105 kg)  Estimated body mass index is 28.89 kg/(m^2) as calculated from the following:   Height as of this encounter: 6\' 2"  (1.88 m).   Weight as of this encounter: 225 lb 1.6 oz (102.105 kg).  GENERAL: vitals reviewed and listed below, alert, oriented, appears well hydrated and in no acute distress  HEENT: head atraumatic, PERRLA, normal appearance of eyes, ears, nose and mouth. moist mucus membranes.  NECK: supple, no masses or lymphadenopathy  LUNGS: clear to auscultation bilaterally, no rales, rhonchi or wheeze  CV: HRRR, no peripheral edema or cyanosis, normal pedal pulses  ABDOMEN: bowel sounds normal, soft, non tender to palpation, no masses, no rebound or guarding  GU: declined  SKIN: no rash or abnormal lesions  MS: normal gait, moves all extremities  normally  NEURO: CN II-XII grossly intact, normal muscle strength and sensation to light touch on extremities  PSYCH: normal affect, pleasant and cooperative  ASSESSMENT AND PLAN:  Discussed the following assessment and plan:  Visit for preventive health examination  Colon cancer screening  Atrial fibrillation, unspecified  Migraine without aura and without status migrainosus, not intractable  Cardioembolic stroke - Plan: Ambulatory referral to Gastroenterology   -Discussed and advised all [place] preventive services health task force level A and B recommendations for age, sex and risks.  -Advised at least 150 minutes of exercise per week and a healthy diet low in saturated fats and sweets and consisting of fresh fruits and vegetables, lean meats such as fish and white chicken and whole grains.  -labs, studies and vaccines per orders this encounter   Patient advised to return to clinic immediately if symptoms worsen or persist or new concerns.  Patient Instructions  Follow up yearly for physical exam  We placed a referral for you as discussed for your colonosocpy. It usually takes about 1-2 weeks to process and schedule this referral. If you have not heard from [place] regarding this appointment in 2 weeks please contact our office.  We recommend the following healthy lifestyle measures: - eat a healthy whole foods diet consisting of regular small meals composed of vegetables, fruits, beans, nuts, seeds, healthy meats such as white chicken and fish and whole grains.  - avoid sweets, white starchy foods, fried foods, fast food, processed foods, sodas, red meet and other fattening foods.  - get a least 150-300 minutes of aerobic exercise per week.       No Follow-up on file.   Kriste Basque R.

## 2015-09-09 NOTE — Progress Notes (Signed)
Pre visit review using our clinic review tool, if applicable. No additional management support is needed unless otherwise documented below in the visit note. 

## 2015-09-09 NOTE — Patient Instructions (Signed)
Follow up yearly for physical exam  We placed a referral for you as discussed for your colonosocpy. It usually takes about 1-2 weeks to process and schedule this referral. If you have not heard from [place] regarding this appointment in 2 weeks please contact our office.  We recommend the following healthy lifestyle measures: - eat a healthy whole foods diet consisting of regular small meals composed of vegetables, fruits, beans, nuts, seeds, healthy meats such as white chicken and fish and whole grains.  - avoid sweets, white starchy foods, fried foods, fast food, processed foods, sodas, red meet and other fattening foods.  - get a least 150-300 minutes of aerobic exercise per week.

## 2015-09-15 ENCOUNTER — Telehealth: Payer: Self-pay

## 2015-09-15 ENCOUNTER — Encounter: Payer: Self-pay | Admitting: Gastroenterology

## 2015-09-15 ENCOUNTER — Ambulatory Visit (INDEPENDENT_AMBULATORY_CARE_PROVIDER_SITE_OTHER): Payer: BLUE CROSS/BLUE SHIELD | Admitting: Gastroenterology

## 2015-09-15 VITALS — BP 110/80 | HR 70 | Ht 74.0 in | Wt 223.0 lb

## 2015-09-15 DIAGNOSIS — K219 Gastro-esophageal reflux disease without esophagitis: Secondary | ICD-10-CM

## 2015-09-15 DIAGNOSIS — Z7901 Long term (current) use of anticoagulants: Secondary | ICD-10-CM

## 2015-09-15 DIAGNOSIS — I482 Chronic atrial fibrillation, unspecified: Secondary | ICD-10-CM

## 2015-09-15 DIAGNOSIS — Z1211 Encounter for screening for malignant neoplasm of colon: Secondary | ICD-10-CM | POA: Diagnosis not present

## 2015-09-15 HISTORY — DX: Gastro-esophageal reflux disease without esophagitis: K21.9

## 2015-09-15 HISTORY — DX: Long term (current) use of anticoagulants: Z79.01

## 2015-09-15 MED ORDER — OMEPRAZOLE 20 MG PO CPDR
20.0000 mg | DELAYED_RELEASE_CAPSULE | Freq: Every day | ORAL | Status: DC
Start: 2015-09-15 — End: 2016-09-12

## 2015-09-15 MED ORDER — NA SULFATE-K SULFATE-MG SULF 17.5-3.13-1.6 GM/177ML PO SOLN: 1.0000 | Freq: Once | ORAL | Status: DC

## 2015-09-15 NOTE — Telephone Encounter (Signed)
RE: Dennis Cortez DOB: 31-Jul-1966 MRN: [id number]   Dear Dr Elease Hashimoto,    We have scheduled the above patient for an endoscopic procedure. Our records show that he is on anticoagulation therapy.   Please advise as to how long the patient may come off his therapy of Xarelto prior to the procedure, which is scheduled for 11/24/15.  Please fax back/ or route the completed form to [name] at [phone]   Sincerely,    [name] [facility])

## 2015-09-15 NOTE — Progress Notes (Signed)
09/15/2015 Dennis Cortez [id number] 17-Oct-1966   HISTORY OF PRESENT ILLNESS:  This is a pleasant 49 year old male who is new to our practice.  Saw Dr. Earlean Shawl several years ago.  He is here today to schedule a screening colonoscopy.  Denies any GI complaints.  He is on Xarelto since June 2015 for chronic atrial fibrillation.  Had a minor stroke in June 2015 so is going to be on lifelong anticoagulation.  Recently saw cardiology and neurology as well as PCP.  Has chronic GERD which is well controlled on omeprazole 20 mg daily.  He is asking for a new prescription for that; says that he meant to ask his PCP but forgot.  He is adopted so does not know family history.   Past Medical History  Diagnosis Date  . Atrial fibrillation ([place])   . MVP (mitral valve prolapse)   . Hyperlipidemia   . LVH (left ventricular hypertrophy)     a. Echo 02/2013:  Mild LVH, EF 55-65%, normal wall motion, mild LAE, mild RVE, mild RAE   . H/O exercise stress test 02/2013    a. ETT-Echo with normal images but significant ST depression and hypotensive response to exercise  . Hx of cardiac catheterization     LHC 03/09/13:  Smooth and normal coronary arteries, normal LVF   Past Surgical History  Procedure Laterality Date  . Knee surgery    . US echocardiography  09/01/2008    EF 55-60%  . US echocardiography  08/17/2005    EF 55-60%    reports that he has never smoked. He has never used smokeless tobacco. He reports that he drinks alcohol. He reports that he does not use illicit drugs. family history is not on file. He was adopted. No Known Allergies    Outpatient Encounter Prescriptions as of 09/15/2015  Medication Sig  . fish oil-omega-3 fatty acids 1000 MG capsule Take 2 g by mouth daily.    . Multiple Vitamin (MULTIVITAMIN) tablet Take 1 tablet by mouth daily.    Marland Kitchen omeprazole (PRILOSEC) 20 MG capsule Take 1 capsule (20 mg total) by mouth daily.  . rosuvastatin (CRESTOR) 10 MG tablet Take 1 tablet (10 mg  total) by mouth daily.  Marland Kitchen venlafaxine XR (EFFEXOR-XR) 75 MG 24 hr capsule Take 75 mg by mouth daily.  Alveda Reasons 20 MG TABS tablet TAKE 1 TABLET BY MOUTH EVERY DAY WITH SUPPER  . Na Sulfate-K Sulfate-Mg Sulf SOLN Take 1 kit by mouth once.   No facility-administered encounter medications on file as of 09/15/2015.     REVIEW OF SYSTEMS  : All other systems reviewed and negative except where noted in the History of Present Illness.   PHYSICAL EXAM: BP 110/80 mmHg  Pulse 70  Ht $R'6\' 2"'zd$  (1.88 m)  Wt 223 lb (101.152 kg)  BMI 28.62 kg/m2 General: Well developed white male in no acute distress Head: Normocephalic and atraumatic Eyes:  Sclerae anicteric, conjunctiva pink. Ears: Normal auditory acuity Lungs: Clear throughout to auscultation Heart: Regular rate and rhythm Abdomen: Soft, non-distended.  Normal bowel sounds.  Non-tender. Rectal:  Will be done at the time of colonoscopy. Musculoskeletal: Symmetrical with no gross deformities  Skin: No lesions on visible extremities Extremities: No edema  Neurological: Alert oriented x 4, grossly non-focal Psychological:  Alert and cooperative. Normal mood and affect  ASSESSMENT AND PLAN: -Screening colonoscopy:  Will schedule with Dr. Henrene Pastor. -Atrial fibrillation, chronic:  On lifelong anticoagulation with Xarelto currently:  Will hold  Xarelto for 1 day prior to endoscopic procedures - will instruct when and how to resume after procedure. Benefits and risks of procedure explained including risks of bleeding, perforation, infection, missed lesions, reactions to medications and possible need for hospitalization and surgery for complications. Additional rare but real risk of stroke or other vascular clotting events off Xarelto also explained and need to seek urgent help if any signs of these problems occur. Will communicate by phone or EMR with patient's prescribing, Dr. Acie Fredrickson, provider to confirm that holding Xarelto is reasonable in this case.    -GERD:  Will refill omeprazole 20 mg daily for 90 day supply with 3 refills.   CC:  Lucretia Kern, DO

## 2015-09-15 NOTE — Progress Notes (Signed)
Patient had atrial fibrillation since the 90's without being on anticoagulation.  He is otherwise healthy.  Do you want to wait for cardiology's response/input first?

## 2015-09-15 NOTE — Progress Notes (Signed)
Dennis Cortez, if the patient is high risk to be off of anticoagulation we could consider cologuard as form of screening and pursue colonoscopy thereafter if positive. Please discuss with patient. Thanks

## 2015-09-15 NOTE — Patient Instructions (Signed)
You have been scheduled for a colonoscopy. Please follow written instructions given to you at your visit today.  Please pick up your prep supplies at the pharmacy within the next 1-3 days. If you use inhalers (even only as needed), please bring them with you on the day of your procedure. Your physician has requested that you go to www.startemmi.com and enter the access code given to you at your visit today. This web site gives a general overview about your procedure. However, you should still follow specific instructions given to you by our office regarding your preparation for the procedure.  We will contact Dr Melburn Popper regarding your Xarelto.  Please call our office if you have not heard from them or our office in 1 week.  We have sent the following medications to your pharmacy for you to pick up at your convenience:  omeprazole

## 2015-09-16 NOTE — Progress Notes (Signed)
Let me know when you get a response from her allergy and/or neurology and we can go from there. Thanks

## 2015-09-21 ENCOUNTER — Telehealth: Payer: Self-pay | Admitting: *Deleted

## 2015-09-21 NOTE — Telephone Encounter (Signed)
Faxed received from [facility]  regarding patient's medication, Omeprazole 20 mg which is approved from 09-19-2015 thru 09-18-2018.

## 2015-09-22 NOTE — Telephone Encounter (Signed)
Dr Melburn Popper please respond

## 2015-09-25 NOTE — Telephone Encounter (Signed)
Dennis Cortez is stable and may hold Xarelto for 2 days prior to endoscopy procedure. Have him restart it the night of the  Procedure if no biopsies are obtained .

## 2015-09-26 ENCOUNTER — Telehealth: Payer: Self-pay | Admitting: Gastroenterology

## 2015-09-26 NOTE — Telephone Encounter (Signed)
Note forwarded to [name], CMA

## 2015-09-26 NOTE — Telephone Encounter (Signed)
Patient notified and verbalized understanding. Patient to call back with any question or concerns.

## 2015-09-26 NOTE — Telephone Encounter (Signed)
Please see telephone note 09-15-15

## 2015-10-10 ENCOUNTER — Other Ambulatory Visit: Payer: Self-pay | Admitting: Family Medicine

## 2015-11-16 ENCOUNTER — Encounter: Payer: Self-pay | Admitting: Internal Medicine

## 2015-11-17 ENCOUNTER — Other Ambulatory Visit: Payer: Self-pay | Admitting: Cardiovascular Disease

## 2015-11-24 ENCOUNTER — Encounter: Payer: Self-pay | Admitting: Internal Medicine

## 2015-11-24 ENCOUNTER — Ambulatory Visit (AMBULATORY_SURGERY_CENTER): Payer: BLUE CROSS/BLUE SHIELD | Admitting: Internal Medicine

## 2015-11-24 VITALS — BP 106/77 | HR 65 | Temp 96.4°F | Resp 17 | Ht 74.0 in | Wt 223.0 lb

## 2015-11-24 DIAGNOSIS — Z1211 Encounter for screening for malignant neoplasm of colon: Secondary | ICD-10-CM | POA: Diagnosis not present

## 2015-11-24 MED ORDER — SODIUM CHLORIDE 0.9 % IV SOLN: 500.0000 mL | INTRAVENOUS | Status: DC

## 2015-11-24 NOTE — Patient Instructions (Signed)
Impressions/recommendations:  Diverticulosis (handout given) High Fiber diet (handout given)  YOU HAD AN ENDOSCOPIC PROCEDURE TODAY AT THE [facility]:   Refer to the procedure report that was given to you for any specific questions about what was found during the examination.  If the procedure report does not answer your questions, please call your gastroenterologist to clarify.  If you requested that your care partner not be given the details of your procedure findings, then the procedure report has been included in a sealed envelope for you to review at your convenience later.  YOU SHOULD EXPECT: Some feelings of bloating in the abdomen. Passage of more gas than usual.  Walking can help get rid of the air that was put into your GI tract during the procedure and reduce the bloating. If you had a lower endoscopy (such as a colonoscopy or flexible sigmoidoscopy) you may notice spotting of blood in your stool or on the toilet paper. If you underwent a bowel prep for your procedure, you may not have a normal bowel movement for a few days.  Please Note:  You might notice some irritation and congestion in your nose or some drainage.  This is from the oxygen used during your procedure.  There is no need for concern and it should clear up in a day or so.  SYMPTOMS TO REPORT IMMEDIATELY:   Following lower endoscopy (colonoscopy or flexible sigmoidoscopy):  Excessive amounts of blood in the stool  Significant tenderness or worsening of abdominal pains  Swelling of the abdomen that is new, acute  Fever of 100F or higher  For urgent or emergent issues, a gastroenterologist can be reached at any hour by calling [phone].   DIET: Your first meal following the procedure should be a small meal and then it is ok to progress to your normal diet. Heavy or fried foods are harder to digest and may make you feel nauseous or bloated.  Likewise, meals heavy in dairy and vegetables can increase  bloating.  Drink plenty of fluids but you should avoid alcoholic beverages for 24 hours.  ACTIVITY:  You should plan to take it easy for the rest of today and you should NOT DRIVE or use heavy machinery until tomorrow (because of the sedation medicines used during the test).    FOLLOW UP: Our staff will call the number listed on your records the next business day following your procedure to check on you and address any questions or concerns that you may have regarding the information given to you following your procedure. If we do not reach you, we will leave a message.  However, if you are feeling well and you are not experiencing any problems, there is no need to return our call.  We will assume that you have returned to your regular daily activities without incident.  If any biopsies were taken you will be contacted by phone or by letter within the next 1-3 weeks.  Please call us at [phone] if you have not heard about the biopsies in 3 weeks.    SIGNATURES/CONFIDENTIALITY: You and/or your care partner have signed paperwork which will be entered into your electronic medical record.  These signatures attest to the fact that that the information above on your After Visit Summary has been reviewed and is understood.  Full responsibility of the confidentiality of this discharge information lies with you and/or your care-partner. 

## 2015-11-24 NOTE — Progress Notes (Signed)
Report to PACU, RN, vss, BBS= Clear.  

## 2015-11-24 NOTE — Op Note (Signed)
[facility] [address] [place], [address]   COLONOSCOPY PROCEDURE REPORT  PATIENT: Dennis, Cortez  MR#: [id number] BIRTHDATE: 1966/03/04 , 49  yrs. old GENDER: male ENDOSCOPIST: Roxy Cedar, MD REFERRED YK:ZLDJTT Kim, D.O. PROCEDURE DATE:  11/24/2015 PROCEDURE:   Colonoscopy, screening First Screening Colonoscopy - Avg.  risk and is 50 yrs.  old or older Yes.  Prior Negative Screening - Now for repeat screening. N/A  History of Adenoma - Now for follow-up colonoscopy & has been > or = to 3 yrs.  N/A  Polyps removed today? No Recommend repeat exam, <10 yrs? No ASA CLASS:   Class III INDICATIONS:Screening for colonic neoplasia and Colorectal Neoplasm Risk Assessment for this procedure is average risk. MEDICATIONS: Monitored anesthesia care and Propofol 300 mg IV  DESCRIPTION OF PROCEDURE:   After the risks benefits and alternatives of the procedure were thoroughly explained, informed consent was obtained.  The digital rectal exam revealed no abnormalities of the rectum.   The LB [id number]  endoscope was introduced through the anus and advanced to the cecum, which was identified by both the appendix and ileocecal valve. No adverse events experienced.   The quality of the prep was excellent. (Suprep was used)  The instrument was then slowly withdrawn as the colon was fully examined. Estimated blood loss is zero unless otherwise noted in this procedure report.     COLON FINDINGS: There was mild diverticulosis noted in the sigmoid colon.   The examination was otherwise normal.  Retroflexed views revealed internal hemorrhoids. The time to cecum = 3.3 Withdrawal time = 10.3   The scope was withdrawn and the procedure completed. COMPLICATIONS: There were no immediate complications.  ENDOSCOPIC IMPRESSION: 1.   Mild diverticulosis was noted in the sigmoid colon 2.   The examination was otherwise normal  RECOMMENDATIONS: 1.  Continue current  colorectal screening recommendations for "routine risk" patients with a repeat colonoscopy in 10 years. 2.  Resume Xarelto today  eSigned:  Roxy Cedar, MD 11/24/2015 11:54 AM   cc: The Patient and Kriste Basque, D.O.

## 2015-11-25 ENCOUNTER — Telehealth: Payer: Self-pay | Admitting: *Deleted

## 2015-11-25 NOTE — Telephone Encounter (Signed)
  Follow up Call-  Call back number 11/24/2015  Post procedure Call Back phone  # [phone]  Permission to leave phone message Yes     No answer at # given.  Left message on voicemail.

## 2016-04-08 ENCOUNTER — Other Ambulatory Visit: Payer: Self-pay | Admitting: Neurology

## 2016-04-09 NOTE — Telephone Encounter (Signed)
Last OV: 09/07/15 Next OV: 09/06/16 Continue venlafaxine ER 75mg  daily

## 2016-08-29 ENCOUNTER — Encounter: Payer: Self-pay | Admitting: Cardiovascular Disease

## 2016-09-06 ENCOUNTER — Encounter: Payer: Self-pay | Admitting: Neurology

## 2016-09-06 ENCOUNTER — Ambulatory Visit (INDEPENDENT_AMBULATORY_CARE_PROVIDER_SITE_OTHER): Payer: BLUE CROSS/BLUE SHIELD | Admitting: Neurology

## 2016-09-06 VITALS — BP 118/78 | HR 81 | Ht 74.0 in | Wt 226.5 lb

## 2016-09-06 DIAGNOSIS — I4891 Unspecified atrial fibrillation: Secondary | ICD-10-CM | POA: Diagnosis not present

## 2016-09-06 DIAGNOSIS — R200 Anesthesia of skin: Secondary | ICD-10-CM

## 2016-09-06 DIAGNOSIS — I639 Cerebral infarction, unspecified: Secondary | ICD-10-CM

## 2016-09-06 DIAGNOSIS — G43009 Migraine without aura, not intractable, without status migrainosus: Secondary | ICD-10-CM | POA: Diagnosis not present

## 2016-09-06 NOTE — Patient Instructions (Signed)
Continue venlafaxine XR daily Monitor the finger numbness.  It's likely a nerve issue but symptoms are so mild that any testing would not likely be diagnostic for sure.  If it gets worse or experience new symptoms, contact me for re-evaluation and further testing. Otherwise, follow up in one year
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
## 2016-09-10 ENCOUNTER — Ambulatory Visit (INDEPENDENT_AMBULATORY_CARE_PROVIDER_SITE_OTHER): Payer: BLUE CROSS/BLUE SHIELD | Admitting: Family Medicine

## 2016-09-10 ENCOUNTER — Encounter: Payer: Self-pay | Admitting: Family Medicine

## 2016-09-10 VITALS — BP 104/76 | HR 83 | Temp 98.1°F | Ht 74.0 in | Wt 226.0 lb

## 2016-09-10 DIAGNOSIS — I482 Chronic atrial fibrillation, unspecified: Secondary | ICD-10-CM

## 2016-09-10 DIAGNOSIS — L309 Dermatitis, unspecified: Secondary | ICD-10-CM

## 2016-09-10 DIAGNOSIS — E785 Hyperlipidemia, unspecified: Secondary | ICD-10-CM

## 2016-09-10 DIAGNOSIS — R202 Paresthesia of skin: Secondary | ICD-10-CM

## 2016-09-10 DIAGNOSIS — Z299 Encounter for prophylactic measures, unspecified: Secondary | ICD-10-CM

## 2016-09-10 DIAGNOSIS — Z6829 Body mass index (BMI) 29.0-29.9, adult: Secondary | ICD-10-CM

## 2016-09-10 DIAGNOSIS — Z0000 Encounter for general adult medical examination without abnormal findings: Secondary | ICD-10-CM | POA: Diagnosis not present

## 2016-09-10 DIAGNOSIS — Z23 Encounter for immunization: Secondary | ICD-10-CM | POA: Diagnosis not present

## 2016-09-10 MED ORDER — TRIAMCINOLONE ACETONIDE 0.1 % EX OINT: 1.0000 "application " | TOPICAL_OINTMENT | Freq: Two times a day (BID) | CUTANEOUS | 0 refills | Status: DC

## 2016-09-10 NOTE — Patient Instructions (Signed)
BEFORE YOU LEAVE: -follow up: yearly and as needed -flu shot  Please send a copy of your labs to [place].  Vit D3 [phone] IU daily.  For the hand eczema: -good emollient (moisterizer) such as Vaseline, Cerave cream or aquaphor several times daily -steroid ointment twice daily for 1-2 weeks when flares -try to avoid overwashing, harsh soaps, water exposure -follow up with dermatologist or here if persists or worsens   We recommend the following healthy lifestyle for LIFE: 1) Small portions.   Tip: eat off of a salad plate instead of a dinner plate.  Tip: It is ok to feel hungry after a meal - that likely means you ate an appropriate portion.  Tip: if you need more or a snack choose fruits, veggies and/or a handful of nuts or seeds.  2) Eat a healthy clean diet.  * Tip: Avoid (less then 1 serving per week): processed foods, sweets, sweetened drinks, white starches (rice, flour, bread, potatoes, pasta, etc), red meat, fast foods, butter  *Tip: CHOOSE instead   * 5-9 servings per day of fresh or frozen fruits and vegetables (but not corn, potatoes, bananas, canned or dried fruit)   *nuts and seeds, beans   *olives and olive oil   *small portions of lean meats such as fish and white chicken    *small portions of whole grains  3)Get at least 150 minutes of sweaty aerobic exercise per week.  4)Reduce stress - consider counseling, meditation and relaxation to balance other aspects of your life.

## 2016-09-10 NOTE — Progress Notes (Signed)
HPI:  Here for CPE:  -Concerns and/or follow up today:   Dennis Cortez is a pleasant 50 yo M with a PMH significant for stroke, A. Fib, migraine, GERD, HLD and anxiety whom has not been here is some time, here today for a CPE. He sees a cardiologist and a neurologist. He reports He is doing well for the most part. He saw his neurologist recently for a new issue of mild paresthesia of a single finger. It is intermittent, seems to be only certain movements, he does rest his elbows on the table frequently. He has not been getting regular aerobic exercise and admits his diet is poor. As a new complaint of dry itchy skin on the left hand and several patches. Tends to wash his hands frequently as is a "germaphobe." denies skin rash elsewhere. Otherwise feels well with no complaints.  -Diet: variety of foods, balance and well rounded, larger portion sizes  -Exercise: no regular exercise  -Diabetes and Dyslipidemia Screening:prefers to do with cardiologist in [place], agrees to send copies  -Hx of HTN: no  -Vaccines: months flu shot today  -sexual activity: yes, male partner, no new partners  -wants STI testing, Hep C screening (if born 77-1966): no  -FH colon or prstate ca: see FH Last colon cancer screening: done Last prostate ca screening: n/a; discussed current guidelines  -Alcohol, Tobacco, drug use: see social history  Review of Systems - no fevers, unintentional weight loss, vision loss, hearing loss, chest pain, sob, hemoptysis, melena, hematochezia, hematuria, genital discharge, changing or concerning skin lesions, bleeding, bruising, loc, thoughts of self harm or SI  Past Medical History:  Diagnosis Date  . Atrial fibrillation (HCC)   . H/O exercise stress test 02/2013   a. ETT-Echo with normal images but significant ST depression and hypotensive response to exercise  . Hx of cardiac catheterization    LHC 03/09/13:  Smooth and normal coronary arteries, normal LVF  .  Hyperlipidemia   . LVH (left ventricular hypertrophy)    a. Echo 02/2013:  Mild LVH, EF 55-65%, normal wall motion, mild LAE, mild RVE, mild RAE   . MVP (mitral valve prolapse)     Past Surgical History:  Procedure Laterality Date  . KNEE SURGERY    . US ECHOCARDIOGRAPHY  09/01/2008   EF 55-60%  . US ECHOCARDIOGRAPHY  08/17/2005   EF 55-60%    Family History  Problem Relation Age of Onset  . Adopted: Yes    Social History   Social History  . Marital status: Married    Spouse name: N/A  . Number of children: N/A  . Years of education: N/A   Social History Main Topics  . Smoking status: Never Smoker  . Smokeless tobacco: Never Used  . Alcohol use 0.0 oz/week     Comment: 2 beers per day  . Drug use: No  . Sexual activity: Yes    Partners: Female   Other Topics Concern  . None   Social History Narrative  . None     Current Outpatient Prescriptions:  .  fish oil-omega-3 fatty acids 1000 MG capsule, Take 2 g by mouth daily.  , Disp: , Rfl:  .  Multiple Vitamin (MULTIVITAMIN) tablet, Take 1 tablet by mouth daily.  , Disp: , Rfl:  .  omeprazole (PRILOSEC) 20 MG capsule, Take 1 capsule (20 mg total) by mouth daily., Disp: 90 capsule, Rfl: 3 .  rosuvastatin (CRESTOR) 10 MG tablet, TAKE 1 TABLET BY MOUTH EVERY DAY,  Disp: 90 tablet, Rfl: 3 .  venlafaxine XR (EFFEXOR-XR) 75 MG 24 hr capsule, TAKE ONE CAPSULE EVERY DAY, Disp: 90 capsule, Rfl: 1 .  XARELTO 20 MG TABS tablet, TAKE 1 TABLET BY MOUTH EVERY DAY WITH SUPPER, Disp: 90 tablet, Rfl: 3 .  triamcinolone ointment (KENALOG) 0.1 %, Apply 1 application topically 2 (two) times daily., Disp: 30 g, Rfl: 0  EXAM:  Vitals:   09/10/16 1448  BP: 104/76  Pulse: 83  Temp: 98.1 F (36.7 C)  TempSrc: Oral  Weight: 226 lb (102.5 kg)  Height: 6\' 2"  (1.88 m)    Estimated body mass index is 29.02 kg/m as calculated from the following:   Height as of this encounter: 6\' 2"  (1.88 m).   Weight as of this encounter: 226 lb (102.5  kg).  GENERAL: vitals reviewed and listed below, alert, oriented, appears well hydrated and in no acute distress  HEENT: head atraumatic, PERRLA, normal appearance of eyes, ears, nose and mouth. moist mucus membranes.  NECK: supple, no masses or lymphadenopathy  LUNGS: clear to auscultation bilaterally, no rales, rhonchi or wheeze  CV: HRRR, no peripheral edema or cyanosis, normal pedal pulses  ABDOMEN: bowel sounds normal, soft, non tender to palpation, no masses, no rebound or guarding  GU: declined  RECTAL: refused  SKIN: no rash or abnormal lesions; declined full skin exam as sees a dermatologist  MS: normal gait, moves all extremities normally  NEURO: normal gait, speech and thought processing grossly intact, muscle tone grossly intact throughout  PSYCH: normal affect, pleasant and cooperative  ASSESSMENT AND PLAN:  Discussed the following assessment and plan:  Encounter for preventive measure -Discussed and advised all [place] preventive services health task force level A and B recommendations for age, sex and risks.  --Advised at least 150 minutes of exercise per week and a healthy diet   -labs, studies and vaccines per orders this encounter  Chronic atrial fibrillation (HCC) Hyperlipidemia, unspecified hyperlipidemia type Hx stroke -managed by neurologist and cardiologist -Lifestyle recommendations  Hand eczema -advised avoidance of irritation, pneumonia, steroid ointment -Follow up advised here or with her rheumatologist persists  Hand paresthesia, unspecified laterality -we discussed possible serious and likely etiologies, workup and treatment, treatment risks and return precautions - given the distribution discussed consideration of all her compression from elbows on the desk at work and even potential for statin-related neuropathy versus other -after this discussion, Dennis PicketScott opted for follow-up with neurologist if persists or worsens  BMI  29.0-29.9,adult -lifestyle recommendations discussed at length -Advised Mediterranean diet and regular aerobic exercise  Encounter for immunization - Plan: Flu Vaccine QUAD 36+ mos IM   Patient advised to return to clinic immediately if symptoms worsen or persist or new concerns.  Patient Instructions  BEFORE YOU LEAVE: -follow up: yearly and as needed -flu shot  Please send a copy of your labs to us.  Vit D3 [phone] IU daily.  For the hand eczema: -good emollient (moisterizer) such as Vaseline, Cerave cream or aquaphor several times daily -steroid ointment twice daily for 1-2 weeks when flares -try to avoid overwashing, harsh soaps, water exposure -follow up with dermatologist or here if persists or worsens   We recommend the following healthy lifestyle for LIFE: 1) Small portions.   Tip: eat off of a salad plate instead of a dinner plate.  Tip: It is ok to feel hungry after a meal - that likely means you ate an appropriate portion.  Tip: if you need more or a snack  choose fruits, veggies and/or a handful of nuts or seeds.  2) Eat a healthy clean diet.  * Tip: Avoid (less then 1 serving per week): processed foods, sweets, sweetened drinks, white starches (rice, flour, bread, potatoes, pasta, etc), red meat, fast foods, butter  *Tip: CHOOSE instead   * 5-9 servings per day of fresh or frozen fruits and vegetables (but not corn, potatoes, bananas, canned or dried fruit)   *nuts and seeds, beans   *olives and olive oil   *small portions of lean meats such as fish and white chicken    *small portions of whole grains  3)Get at least 150 minutes of sweaty aerobic exercise per week.  4)Reduce stress - consider counseling, meditation and relaxation to balance other aspects of your life.      No Follow-up on file.   Kriste Basque R., DO

## 2016-09-10 NOTE — Progress Notes (Signed)
Pre visit review using our clinic review tool, if applicable. No additional management support is needed unless otherwise documented below in the visit note. 

## 2016-09-12 ENCOUNTER — Ambulatory Visit (INDEPENDENT_AMBULATORY_CARE_PROVIDER_SITE_OTHER): Payer: BLUE CROSS/BLUE SHIELD | Admitting: Cardiovascular Disease

## 2016-09-12 ENCOUNTER — Encounter: Payer: Self-pay | Admitting: Cardiovascular Disease

## 2016-09-12 ENCOUNTER — Other Ambulatory Visit: Payer: Self-pay | Admitting: Gastroenterology

## 2016-09-12 VITALS — BP 122/84 | HR 61 | Ht 74.0 in | Wt 231.8 lb

## 2016-09-12 DIAGNOSIS — I482 Chronic atrial fibrillation, unspecified: Secondary | ICD-10-CM

## 2016-09-12 DIAGNOSIS — I447 Left bundle-branch block, unspecified: Secondary | ICD-10-CM

## 2016-09-12 DIAGNOSIS — E785 Hyperlipidemia, unspecified: Secondary | ICD-10-CM | POA: Diagnosis not present

## 2016-09-12 LAB — LIPID PANEL
CHOL/HDL RATIO: 2.8 ratio (ref <=5.0)
Cholesterol: 183 mg/dL (ref 125–200)
HDL: 65 mg/dL (ref >=40)
LDL CALC: 103 mg/dL (ref <130)
TRIGLYCERIDES: 74 mg/dL (ref <150)
VLDL: 15 mg/dL (ref <30)

## 2016-09-12 LAB — COMPREHENSIVE METABOLIC PANEL
ALT: 24 U/L (ref 9–46)
AST: 23 U/L (ref 10–35)
Albumin: 4.4 g/dL (ref 3.6–5.1)
Alkaline Phosphatase: 61 U/L (ref 40–115)
BUN: 12 mg/dL (ref 7–25)
CHLORIDE: 105 mmol/L (ref 98–110)
CO2: 23 mmol/L (ref 20–31)
Calcium: 9.8 mg/dL (ref 8.6–10.3)
Creat: 1.06 mg/dL (ref 0.70–1.33)
GLUCOSE: 88 mg/dL (ref 65–99)
POTASSIUM: 4.5 mmol/L (ref 3.5–5.3)
Sodium: 141 mmol/L (ref 135–146)
TOTAL PROTEIN: 6.7 g/dL (ref 6.1–8.1)
Total Bilirubin: 0.5 mg/dL (ref 0.2–1.2)

## 2016-09-12 NOTE — Patient Instructions (Signed)
Medication Instructions:  Your physician recommends that you continue on your current medications as directed. Please refer to the Current Medication list given to you today.   Labwork: TODAY - cholesterol, complete metabolic panel   Testing/Procedures: None Ordered   Follow-Up: Your physician wants you to follow-up in: 1 year with Dr. Nahser.  You will receive a reminder letter in the mail two months in advance. If you don't receive a letter, please call our office to schedule the follow-up appointment.   If you need a refill on your cardiac medications before your next appointment, please call your pharmacy.   Thank you for choosing [facility]! Sarra Rachels, RN [phone]    

## 2016-09-12 NOTE — Progress Notes (Signed)
Karmen BongoScott Clodfelter Date of Birth  04/03/1966       [facility]    [place] [facility] [address], [address]  [address], [address] [place], [place]  [address]   [place], [place]  [id number] [phone]     [phone]   Fax  [phone]    Fax [phone]  Problem List: 1. Atrial Fibrillation 2. Hyperlipidemia    Dennis Cortez is doing well.  Asymptomatic.  His last echo was 3 years ago.  He is working out at [employer] on a regular basis.  He has never had any symptoms related to his A-Fib.  His CHADS score is 0  March 29, 2014:  Dennis Cortez is doing well.  Not working out as much as he would like . Doing well from a cardiac standpoint.  No CP or dyspnea.     July 30, 2014: Dennis Cortez is seen today for followup of his atrial fibrillation. He's been maintained on aspirin because of his low chads Vasc score.  He presented several months after I saw him with some visual disturbances and headache  and was found stroke. He was started on Xarelto at that time.   He feels ok but is still having some headaches.    Sept. 19, 2016:    Doing well.  Has been walking.     No CP or dyspnea.    Oct. 4, 2017:  Dennis Cortez is seen today for follow-up of his atrial fibrillation and LBBB. Tough year. His mother died this past year. managment changes at work     Current Outpatient Prescriptions on File Prior to Visit  Medication Sig Dispense Refill  . fish oil-omega-3 fatty acids 1000 MG capsule Take 2 g by mouth daily.      . Multiple Vitamin (MULTIVITAMIN) tablet Take 1 tablet by mouth daily.      Marland Kitchen. omeprazole (PRILOSEC) 20 MG capsule Take 1 capsule (20 mg total) by mouth daily. 90 capsule 3  . rosuvastatin (CRESTOR) 10 MG tablet TAKE 1 TABLET BY MOUTH EVERY DAY 90 tablet 3  . triamcinolone ointment (KENALOG) 0.1 % Apply 1 application topically 2 (two) times daily. 30 g 0  . venlafaxine XR (EFFEXOR-XR) 75 MG 24 hr capsule TAKE ONE CAPSULE EVERY DAY 90 capsule 1  . XARELTO 20 MG TABS tablet  TAKE 1 TABLET BY MOUTH EVERY DAY WITH SUPPER 90 tablet 3   No current facility-administered medications on file prior to visit.     No Known Allergies  Past Medical History:  Diagnosis Date  . Atrial fibrillation (HCC)   . H/O exercise stress test 02/2013   a. ETT-Echo with normal images but significant ST depression and hypotensive response to exercise  . Hx of cardiac catheterization    LHC 03/09/13:  Smooth and normal coronary arteries, normal LVF  . Hyperlipidemia   . LVH (left ventricular hypertrophy)    a. Echo 02/2013:  Mild LVH, EF 55-65%, normal wall motion, mild LAE, mild RVE, mild RAE   . MVP (mitral valve prolapse)     Past Surgical History:  Procedure Laterality Date  . KNEE SURGERY    . US ECHOCARDIOGRAPHY  09/01/2008   EF 55-60%  . US ECHOCARDIOGRAPHY  08/17/2005   EF 55-60%    History  Smoking Status  . Never Smoker  Smokeless Tobacco  . Never Used    History  Alcohol Use  . 0.0 oz/week    Comment: 2 beers per day  Family History  Problem Relation Age of Onset  . Adopted: Yes    Reviw of Systems:  Reviewed in the HPI.  All other systems are negative.  Physical Exam: Blood pressure 122/84, pulse 61, height 6\' 2"  (1.88 m), weight 231 lb 12.8 oz (105.1 kg). General: Well developed, well nourished, in no acute distress.  Head: Normocephalic, atraumatic, sclera non-icteric, mucus membranes are moist,   Neck: Supple. Carotids are 2 + without bruits. No JVD  Lungs: Clear bilaterally to auscultation.  Heart: irregularly irregular.  normal  S1 S2. No significant  murmurs, gallops or rubs.  Abdomen: Soft, non-tender, non-distended with normal bowel sounds. No hepatomegaly. No rebound/guarding. No masses.  Msk:  Strength and tone are normal  Extremities: No clubbing or cyanosis. No edema.  Distal pedal pulses are 2+ and equal bilaterally.  Neuro: Alert and oriented X 3. Moves all extremities spontaneously.  Psych:  Responds to questions  appropriately with a normal affect.  ECG: Oct. 4, 2017:   Atrial fib with LBBB.    Assessment / Plan:   1. Atrial Fibrillation  - has chronic afib .  Rate is well controlled.   2. Hyperlipidemia - labs checked today .  Continue current meds.   3. CVA:  had a CVA before he was on Xarelto.  Continue Xarelto lifelong .   4. LBBB - stable      Kristeen Miss, MD  09/12/2016 8:10 AM    [facility] [address] [place],  [address] [place], [place]  [address] Pager [phone] Phone: [phone]; Fax: [phone]

## 2016-10-05 ENCOUNTER — Other Ambulatory Visit: Payer: Self-pay | Admitting: Neurology

## 2016-10-05 ENCOUNTER — Other Ambulatory Visit: Payer: Self-pay | Admitting: Family Medicine

## 2016-10-18 ENCOUNTER — Other Ambulatory Visit: Payer: Self-pay | Admitting: Cardiovascular Disease

## 2016-10-24 ENCOUNTER — Encounter: Payer: Self-pay | Admitting: Family Medicine

## 2016-10-24 ENCOUNTER — Ambulatory Visit (INDEPENDENT_AMBULATORY_CARE_PROVIDER_SITE_OTHER): Payer: BLUE CROSS/BLUE SHIELD | Admitting: Family Medicine

## 2016-10-24 VITALS — BP 126/84 | HR 71 | Temp 98.0°F | Wt 226.4 lb

## 2016-10-24 DIAGNOSIS — M10071 Idiopathic gout, right ankle and foot: Secondary | ICD-10-CM

## 2016-10-24 DIAGNOSIS — I482 Chronic atrial fibrillation, unspecified: Secondary | ICD-10-CM

## 2016-10-24 MED ORDER — COLCHICINE 0.6 MG PO TABS: ORAL_TABLET | ORAL | 1 refills | Status: DC

## 2016-10-24 NOTE — Progress Notes (Signed)
Pre visit review using our clinic review tool, if applicable. No additional management support is needed unless otherwise documented below in the visit note. 

## 2016-10-24 NOTE — Progress Notes (Signed)
Subjective:  Dennis Cortez is a 50 y.o. year old very pleasant male patient who presents for/with See problem oriented charting ROS- no fever, chills. No expanding redness but doe shave persistent redness andwarmth along great toe on right foot.see any ROS included in HPI as well.   Past Medical History-  Patient Active Problem List   Diagnosis Date Noted  . Chronic anticoagulation 09/15/2015  . Esophageal reflux 09/15/2015  . Migraine without aura and without status migrainosus, not intractable 05/19/2015  . Cardioembolic stroke (HCC) 05/19/2015  . Generalized anxiety disorder 05/19/2015  . ATRIAL FIBRILLATION  01/06/2008  . Hyperlipemia 10/22/2007  . MITRAL VALVE PROLAPSE 10/22/2007    Medications- reviewed and updated Current Outpatient Prescriptions  Medication Sig Dispense Refill  . fish oil-omega-3 fatty acids 1000 MG capsule Take 2 g by mouth daily.      . Multiple Vitamin (MULTIVITAMIN) tablet Take 1 tablet by mouth daily.      Marland Kitchen omeprazole (PRILOSEC) 20 MG capsule TAKE 1 CAPSULE (20 MG TOTAL) BY MOUTH DAILY. 90 capsule 3  . rosuvastatin (CRESTOR) 10 MG tablet TAKE 1 TABLET BY MOUTH EVERY DAY 90 tablet 3  . triamcinolone ointment (KENALOG) 0.1 % Apply 1 application topically 2 (two) times daily. 30 g 0  . venlafaxine XR (EFFEXOR-XR) 75 MG 24 hr capsule TAKE ONE CAPSULE EVERY DAY 90 capsule 1  . XARELTO 20 MG TABS tablet TAKE 1 TABLET BY MOUTH EVERY DAY WITH SUPPER 90 tablet 3   No current facility-administered medications for this visit.     Objective: BP 126/84 (BP Location: Left Arm, Patient Position: Sitting, Cuff Size: Normal)   Pulse 71   Temp 98 F (36.7 C) (Oral)   Wt 226 lb 6.4 oz (102.7 kg)   SpO2 97%   BMI 29.07 kg/m  Gen: NAD, resting comfortably, slightly uncomfortable gait  Ext: no edema pretibially MSK: Patient with tenderness, warmth, edema both at the MTP and IP joint on right great toe. Very tender to palpation of the joint Skin: warm, dry Neuro:  intact distal sensation  Assessment/Plan:  Right great toe pain likely due to gout S: 4 days of symptoms. Was working on some painting in his house over weekend and down on ground most of day from 2-9 pm. Woke up next day with some mild pain in right great toe but worsened to severe pain on Monday. Worse with walking. Got better then worsened again today. Tylenol helps some.   Did have some increased alcohol intake on Friday and Saturday night. Seafood on Friday with fried cod  Higher risk patient with stroke history due to embolism from a fib.   A/P: LIkely acute gout flare. Treat with colchicine. Opted to avoid nsaid with being on xarelto with a fib/ cva history. Discussed prednisone option if does not improve or insurance concerns. insturctions per AVS for follow up- next steps reasonable by mychart if not improving, likely follow up if worsening. Get uric acid in 4-6 weeks- patient is to schedule this (sent mychart message as didn't include in avs as intended)   Orders Placed This Encounter  Procedures  . Uric Acid    Standing Status:   Future    Standing Expiration Date:   10/24/2017    Meds ordered this encounter  Medications  . colchicine 0.6 MG tablet    Sig: Take 2 tabs at first sign of gout flare, then may repeat 1 tab in an hour. Take daily after for up to 10 days- see  PCP if no improvement    Dispense:  30 tablet    Refill:  1    Return precautions advised.  Tana ConchStephen Coleen Cardiff, MD

## 2016-10-24 NOTE — Patient Instructions (Signed)
Suspect gout- though odd in 2 joints of the right big toe.   Colchicine for up to 10 days. Insurance can be weird with this medicine- sometimes we have to call in the name brand- call us ASAP if any issues.   If not making any improvement by Friday- I would call in prednisone instead.   Follow up with us if neither of above options treat your pain and ESPECIALLY if symptoms worsen    Gout Gout is painful swelling that can occur in some of your joints. Gout is a type of arthritis. This condition is caused by having too much uric acid in your body. Uric acid is a chemical that forms when your body breaks down substances called purines. Purines are important for building body proteins. When your body has too much uric acid, sharp crystals can form and build up inside your joints. This causes pain and swelling. Gout attacks can happen quickly and be very painful (acute gout). Over time, the attacks can affect more joints and become more frequent (chronic gout). Gout can also cause uric acid to build up under your skin and inside your kidneys. What are the causes? This condition is caused by too much uric acid in your blood. This can occur because:  Your kidneys do not remove enough uric acid from your blood. This is the most common cause.  Your body makes too much uric acid. This can occur with some cancers and cancer treatments. It can also occur if your body is breaking down too many red blood cells (hemolytic anemia).  You eat too many foods that are high in purines. These foods include organ meats and some seafood. Alcohol, especially beer, is also high in purines. A gout attack may be triggered by trauma or stress. What increases the risk? This condition is more likely to develop in people who:  Have a family history of gout.  Are male and middle-aged.  Are male and have gone through menopause.  Are obese.  Frequently drink alcohol, especially beer.  Are dehydrated.  Lose  weight too quickly.  Have an organ transplant.  Have lead poisoning.  Take certain medicines, including aspirin, cyclosporine, diuretics, levodopa, and niacin.  Have kidney disease or psoriasis. What are the signs or symptoms? An attack of acute gout happens quickly. It usually occurs in just one joint. The most common place is the big toe. Attacks often start at night. Other joints that may be affected include joints of the feet, ankle, knee, fingers, wrist, or elbow. Symptoms may include:  Severe pain.  Warmth.  Swelling.  Stiffness.  Tenderness. The affected joint may be very painful to touch.  Shiny, red, or purple skin.  Chills and fever. Chronic gout may cause symptoms more frequently. More joints may be involved. You may also have white or yellow lumps (tophi) on your hands or feet or in other areas near your joints. How is this diagnosed? This condition is diagnosed based on your symptoms, medical history, and physical exam. You may have tests, such as:  Blood tests to measure uric acid levels.  Removal of joint fluid with a needle (aspiration) to look for uric acid crystals.  X-rays to look for joint damage. How is this treated? Treatment for this condition has two phases: treating an acute attack and preventing future attacks. Acute gout treatment may include medicines to reduce pain and swelling, including:  NSAIDs.  Steroids. These are strong anti-inflammatory medicines that can be taken by mouth (orally)  or injected into a joint.  Colchicine. This medicine relieves pain and swelling when it is taken soon after an attack. It can be given orally or through an IV tube. Preventive treatment may include:  Daily use of smaller doses of NSAIDs or colchicine.  Use of a medicine that reduces uric acid levels in your blood.  Changes to your diet. You may need to see a specialist about healthy eating (dietitian). Follow these instructions at home: During a Gout  Attack  If directed, apply ice to the affected area:  Put ice in a plastic bag.  Place a towel between your skin and the bag.  Leave the ice on for 20 minutes, 2-3 times a day.  Rest the joint as much as possible. If the affected joint is in your leg, you may be given crutches to use.  Raise (elevate) the affected joint above the level of your heart as often as possible.  Drink enough fluids to keep your urine clear or pale yellow.  Take over-the-counter and prescription medicines only as told by your health care provider.  Do not drive or operate heavy machinery while taking prescription pain medicine.  Follow instructions from your health care provider about eating or drinking restrictions.  Return to your normal activities as told by your health care provider. Ask your health care provider what activities are safe for you. Avoiding Future Gout Attacks  Follow a low-purine diet as told by your dietitian or health care provider. Avoid foods and drinks that are high in purines, including liver, kidney, anchovies, asparagus, herring, mushrooms, mussels, and beer.  Limit alcohol intake to no more than 1 drink a day for nonpregnant women and 2 drinks a day for men. One drink equals 12 oz of beer, 5 oz of wine, or 1 oz of hard liquor.  Maintain a healthy weight or lose weight if you are overweight. If you want to lose weight, talk with your health care provider. It is important that you do not lose weight too quickly.  Start or maintain an exercise program as told by your health care provider.  Drink enough fluids to keep your urine clear or pale yellow.  Take over-the-counter and prescription medicines only as told by your health care provider.  Keep all follow-up visits as told by your health care provider. This is important. Contact a health care provider if:  You have another gout attack.  You continue to have symptoms of a gout attack after10 days of treatment.  You have  side effects from your medicines.  You have chills or a fever.  You have burning pain when you urinate.  You have pain in your lower back or belly. Get help right away if:  You have severe or uncontrolled pain.  You cannot urinate. This information is not intended to replace advice given to you by your health care provider. Make sure you discuss any questions you have with your health care provider. Document Released: 11/23/2000 Document Revised: 05/03/2016 Document Reviewed: 09/08/2015 Elsevier Interactive Patient Education  2017 [employer].

## 2017-01-19 ENCOUNTER — Other Ambulatory Visit: Payer: Self-pay | Admitting: Family Medicine

## 2017-01-23 NOTE — Telephone Encounter (Signed)
Looks like rxed by another doctor for possible gout. Ok to refill once, but pt needs to check uric acid lab in a few weeks once flare resolved to help confirm dx and need to follow up with me for any further episodes. Thanks!

## 2017-01-24 NOTE — Telephone Encounter (Signed)
LMTCB to advise. Labs already ordered.

## 2017-01-24 NOTE — Telephone Encounter (Signed)
Spoke with pt and he states that he did not ask for a refill. It must have been an automatic refill request from the pharmacy. He is not having a flare at this time. Advised pt that next time he has a flare Dr Selena Batten would like to see him and that he does need to have labs drawn. Pt voiced understanding. Nothing further needed at this time.

## 2017-02-26 DIAGNOSIS — L578 Other skin changes due to chronic exposure to nonionizing radiation: Secondary | ICD-10-CM | POA: Diagnosis not present

## 2017-02-26 DIAGNOSIS — L570 Actinic keratosis: Secondary | ICD-10-CM | POA: Diagnosis not present

## 2017-02-26 DIAGNOSIS — L301 Dyshidrosis [pompholyx]: Secondary | ICD-10-CM | POA: Diagnosis not present

## 2017-02-26 DIAGNOSIS — L821 Other seborrheic keratosis: Secondary | ICD-10-CM | POA: Diagnosis not present

## 2017-03-31 ENCOUNTER — Other Ambulatory Visit: Payer: Self-pay | Admitting: Neurology

## 2017-05-13 DIAGNOSIS — L821 Other seborrheic keratosis: Secondary | ICD-10-CM | POA: Diagnosis not present

## 2017-05-13 DIAGNOSIS — L578 Other skin changes due to chronic exposure to nonionizing radiation: Secondary | ICD-10-CM | POA: Diagnosis not present

## 2017-05-13 DIAGNOSIS — L570 Actinic keratosis: Secondary | ICD-10-CM | POA: Diagnosis not present

## 2017-08-29 ENCOUNTER — Encounter: Payer: Self-pay | Admitting: Family Medicine

## 2017-09-02 ENCOUNTER — Other Ambulatory Visit: Payer: Self-pay | Admitting: Internal Medicine

## 2017-09-06 ENCOUNTER — Encounter: Payer: Self-pay | Admitting: Neurology

## 2017-09-06 ENCOUNTER — Ambulatory Visit (INDEPENDENT_AMBULATORY_CARE_PROVIDER_SITE_OTHER): Payer: BLUE CROSS/BLUE SHIELD | Admitting: Neurology

## 2017-09-06 VITALS — BP 118/76 | HR 78 | Ht 74.0 in | Wt 224.6 lb

## 2017-09-06 DIAGNOSIS — F419 Anxiety disorder, unspecified: Secondary | ICD-10-CM

## 2017-09-06 DIAGNOSIS — I4891 Unspecified atrial fibrillation: Secondary | ICD-10-CM

## 2017-09-06 DIAGNOSIS — G43109 Migraine with aura, not intractable, without status migrainosus: Secondary | ICD-10-CM

## 2017-09-06 DIAGNOSIS — G43009 Migraine without aura, not intractable, without status migrainosus: Secondary | ICD-10-CM

## 2017-09-06 DIAGNOSIS — I63431 Cerebral infarction due to embolism of right posterior cerebral artery: Secondary | ICD-10-CM | POA: Diagnosis not present
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
## 2017-09-06 NOTE — Patient Instructions (Addendum)
Continue venlafaxine XR 75mg  daily Continue Xarelto and Crestor Follow up with the eye doctor Follow up with me in one year

## 2017-09-10 NOTE — Progress Notes (Signed)
HPI:  Here for CPE: Due for labs, flu shot,  -Concerns and/or follow up today:  PMH anxiety, migraines, hx stroke - sees neurologist, gout, gerd. Reports mood is great despite increased stress and wants to continue the effexor. Sees dermatologist for skin exams.  -Diet: variety of foods, balance and well rounded  -Exercise: no regular exercise  -Diabetes and Dyslipidemia Screening:fasting for labs  -Hx of HTN: no  -Vaccines: UTD except flu which he plans to do today  -sexual activity: yes, male partner, no new partners  -wants STI testing, Hep C screening (if born 67-1966): no  -FH colon or prstate ca: see FH Last colon cancer screening: UTd Last prostate ca screening: discussed, not due, no symptoms  -Alcohol, Tobacco, drug use: see social history  Review of Systems - no fevers, unintentional weight loss, vision loss, hearing loss, chest pain, sob, hemoptysis, melena, hematochezia, hematuria, genital discharge, changing or concerning skin lesions, bleeding, bruising, loc, thoughts of self harm or SI  Past Medical History:  Diagnosis Date  . Atrial fibrillation (HCC)   . H/O exercise stress test 02/2013   a. ETT-Echo with normal images but significant ST depression and hypotensive response to exercise  . Hx of cardiac catheterization    LHC 03/09/13:  Smooth and normal coronary arteries, normal LVF  . Hyperlipidemia   . LVH (left ventricular hypertrophy)    a. Echo 02/2013:  Mild LVH, EF 55-65%, normal wall motion, mild LAE, mild RVE, mild RAE   . MVP (mitral valve prolapse)     Past Surgical History:  Procedure Laterality Date  . KNEE SURGERY    . US ECHOCARDIOGRAPHY  09/01/2008   EF 55-60%  . US ECHOCARDIOGRAPHY  08/17/2005   EF 55-60%    Family History  Problem Relation Age of Onset  . Adopted: Yes    Social History   Social History  . Marital status: Married    Spouse name: N/A  . Number of children: N/A  . Years of education: N/A   Social History  Main Topics  . Smoking status: Never Smoker  . Smokeless tobacco: Never Used  . Alcohol use 0.0 oz/week     Comment: 2 beers per day  . Drug use: No  . Sexual activity: Yes    Partners: Female   Other Topics Concern  . None   Social History Narrative  . None     Current Outpatient Prescriptions:  .  colchicine 0.6 MG tablet, TAKE 2 TABS AT FIRST FIGN OF GOUT, MAY REPEAT 1 RAB IN AN HOUR. TAKE DAILY AFTER FOR UP TO 10 DAYS, Disp: 30 tablet, Rfl: 1 .  fish oil-omega-3 fatty acids 1000 MG capsule, Take 2 g by mouth daily.  , Disp: , Rfl:  .  Multiple Vitamin (MULTIVITAMIN) tablet, Take 1 tablet by mouth daily.  , Disp: , Rfl:  .  omeprazole (PRILOSEC) 20 MG capsule, TAKE ONE CAPSULE BY MOUTH EVERY DAY, Disp: 90 capsule, Rfl: 0 .  rosuvastatin (CRESTOR) 10 MG tablet, TAKE 1 TABLET BY MOUTH EVERY DAY, Disp: 90 tablet, Rfl: 3 .  venlafaxine XR (EFFEXOR-XR) 75 MG 24 hr capsule, TAKE ONE CAPSULE EVERY DAY, Disp: 90 capsule, Rfl: 1 .  XARELTO 20 MG TABS tablet, TAKE 1 TABLET BY MOUTH EVERY DAY WITH SUPPER, Disp: 90 tablet, Rfl: 3  EXAM:  Vitals:   09/12/17 0712  BP: 100/80  Pulse: 65  Temp: 98.7 F (37.1 C)  TempSrc: Oral  Weight: 228 lb 12.8 oz (103.8  kg)  Height: 6' 1.5" (1.867 m)    Estimated body mass index is 29.78 kg/m as calculated from the following:   Height as of this encounter: 6' 1.5" (1.867 m).   Weight as of this encounter: 228 lb 12.8 oz (103.8 kg).  GENERAL: vitals reviewed and listed below, alert, oriented, appears well hydrated and in no acute distress  HEENT: head atraumatic, PERRLA, normal appearance of eyes, ears, nose and mouth. moist mucus membranes.  NECK: supple, no masses or lymphadenopathy  LUNGS: clear to auscultation bilaterally, no rales, rhonchi or wheeze  CV: HRRR, no peripheral edema or cyanosis, normal pedal pulses  ABDOMEN: bowel sounds normal, soft, non tender to palpation, no masses, no rebound or guarding  GU: deferred RECTAL:  refused  SKIN: no rash or abnormal lesions, hand eczema  MS: normal gait, moves all extremities normally  NEURO: normal gait, speech and thought processing grossly intact, muscle tone grossly intact throughout  PSYCH: normal affect, pleasant and cooperative  ASSESSMENT AND PLAN:  Discussed the following assessment and plan:  Encounter for preventative adult health care examination  Chronic anticoagulation - Plan: Basic metabolic panel, CBC, Hemoglobin A1c  Cardioembolic stroke (HCC)  Generalized anxiety disorder  Hyperlipidemia, unspecified hyperlipidemia type - Plan: Lipid panel  Need for immunization against influenza - Plan: Flu Vaccine QUAD 6+ mos PF IM (Fluarix Quad PF)   -Discussed and advised all [place] preventive services health task force level A and B recommendations for age, sex and risks.  --Advised at least 150 minutes of exercise per week and a healthy diet with avoidance of (less then 1 serving per week) processed foods, white starches, red meat, fast foods and sweets and consisting of: * 5-9 servings of fresh fruits and vegetables (not corn or potatoes) *nuts and seeds, beans *olives and olive oil *lean meats such as fish and white chicken  *whole grains  -labs, studies and vaccines per orders this encounter   Patient advised to return to clinic immediately if symptoms worsen or persist or new concerns.  Patient Instructions  BEFORE YOU LEAVE: -flu shot -labs -follow up: 6 months  We have ordered labs or studies at this visit. It can take up to 1-2 weeks for results and processing. IF results require follow up or explanation, we will call you with instructions. Clinically stable results will be released to your [facility]. If you have not heard from [place] or cannot find your results in [facility] in 2 weeks please contact our office at [phone].  If you are not yet signed up for [facility], please consider signing up.  WE NOW OFFER   [facility] FAST  TRACK!!!  SAME DAY Appointments for ACUTE CARE  Such as: Sprains, Injuries, cuts, abrasions, rashes, muscle pain, joint pain, back pain Colds, flu, sore throats, headache, allergies, cough, fever  Ear pain, sinus and eye infections Abdominal pain, nausea, vomiting, diarrhea, upset stomach Animal/insect bites  3 Easy Ways to Schedule: Walk-In Scheduling Call in scheduling Mychart Sign-up: [email or web address]    Health Maintenance, Male A healthy lifestyle and preventive care is important for your health and wellness. Ask your health care provider about what schedule of regular examinations is right for you. What should I know about weight and diet? Eat a Healthy Diet  Eat plenty of vegetables, fruits, whole grains, low-fat dairy products, and lean protein.  Do not eat a lot of foods high in solid fats, added sugars, or salt.  Maintain a Healthy Weight Regular exercise can help you achieve  or maintain a healthy weight. You should:  Do at least 150 minutes of exercise each week. The exercise should increase your heart rate and make you sweat (moderate-intensity exercise).  Do strength-training exercises at least twice a week.  Watch Your Levels of Cholesterol and Blood Lipids  Have your blood tested for lipids and cholesterol every 5 years starting at 52 years of age. If you are at high risk for heart disease, you should start having your blood tested when you are 52 years old. You may need to have your cholesterol levels checked more often if: ? Your lipid or cholesterol levels are high. ? You are older than 52 years of age. ? You are at high risk for heart disease.  What should I know about cancer screening? Many types of cancers can be detected early and may often be prevented. Lung Cancer  You should be screened every year for lung cancer if: ? You are a current smoker who has smoked for at least 30 years. ? You are a former smoker who has quit within the  past 15 years.  Talk to your health care provider about your screening options, when you should start screening, and how often you should be screened.  Colorectal Cancer  Routine colorectal cancer screening usually begins at 52 years of age and should be repeated every 5-10 years until you are 52 years old. You may need to be screened more often if early forms of precancerous polyps or small growths are found. Your health care provider may recommend screening at an earlier age if you have risk factors for colon cancer.  Your health care provider may recommend using home test kits to check for hidden blood in the stool.  A small camera at the end of a tube can be used to examine your colon (sigmoidoscopy or colonoscopy). This checks for the earliest forms of colorectal cancer.  Prostate and Testicular Cancer  Depending on your age and overall health, your health care provider may do certain tests to screen for prostate and testicular cancer.  Talk to your health care provider about any symptoms or concerns you have about testicular or prostate cancer.  Skin Cancer  Check your skin from head to toe regularly.  Tell your health care provider about any new moles or changes in moles, especially if: ? There is a change in a mole's size, shape, or color. ? You have a mole that is larger than a pencil eraser.  Always use sunscreen. Apply sunscreen liberally and repeat throughout the day.  Protect yourself by wearing long sleeves, pants, a wide-brimmed hat, and sunglasses when outside.  What should I know about heart disease, diabetes, and high blood pressure?  If you are 19-64 years of age, have your blood pressure checked every 3-5 years. If you are 68 years of age or older, have your blood pressure checked every year. You should have your blood pressure measured twice-once when you are at a hospital or clinic, and once when you are not at a hospital or clinic. Record the average of the two  measurements. To check your blood pressure when you are not at a hospital or clinic, you can use: ? An automated blood pressure machine at a pharmacy. ? A home blood pressure monitor.  Talk to your health care provider about your target blood pressure.  If you are between 72-48 years old, ask your health care provider if you should take aspirin to prevent heart disease.  Have  regular diabetes screenings by checking your fasting blood sugar level. ? If you are at a normal weight and have a low risk for diabetes, have this test once every three years after the age of 58. ? If you are overweight and have a high risk for diabetes, consider being tested at a younger age or more often.  A one-time screening for abdominal aortic aneurysm (AAA) by ultrasound is recommended for men aged 65-75 years who are current or former smokers. What should I know about preventing infection? Hepatitis B If you have a higher risk for hepatitis B, you should be screened for this virus. Talk with your health care provider to find out if you are at risk for hepatitis B infection. Hepatitis C Blood testing is recommended for:  Everyone born from [birth year] through 1966.  Anyone with known risk factors for hepatitis C.  Sexually Transmitted Diseases (STDs)  You should be screened each year for STDs including gonorrhea and chlamydia if: ? You are sexually active and are younger than 52 years of age. ? You are older than 52 years of age and your health care provider tells you that you are at risk for this type of infection. ? Your sexual activity has changed since you were last screened and you are at an increased risk for chlamydia or gonorrhea. Ask your health care provider if you are at risk.  Talk with your health care provider about whether you are at high risk of being infected with HIV. Your health care provider may recommend a prescription medicine to help prevent HIV infection.  What else can I do?  Schedule  regular health, dental, and eye exams.  Stay current with your vaccines (immunizations).  Do not use any tobacco products, such as cigarettes, chewing tobacco, and e-cigarettes. If you need help quitting, ask your health care provider.  Limit alcohol intake to no more than 2 drinks per day. One drink equals 12 ounces of beer, 5 ounces of wine, or 1 ounces of hard liquor.  Do not use street drugs.  Do not share needles.  Ask your health care provider for help if you need support or information about quitting drugs.  Tell your health care provider if you often feel depressed.  Tell your health care provider if you have ever been abused or do not feel safe at home. This information is not intended to replace advice given to you by your health care provider. Make sure you discuss any questions you have with your health care provider. Document Released: 05/24/2008 Document Revised: 07/25/2016 Document Reviewed: 08/30/2015 Elsevier Interactive Patient Education  2018 [employer].               No Follow-up on file.   Kriste Basque R., DO

## 2017-09-12 ENCOUNTER — Encounter: Payer: Self-pay | Admitting: Family Medicine

## 2017-09-12 ENCOUNTER — Ambulatory Visit (INDEPENDENT_AMBULATORY_CARE_PROVIDER_SITE_OTHER): Payer: BLUE CROSS/BLUE SHIELD | Admitting: Family Medicine

## 2017-09-12 VITALS — BP 100/80 | HR 65 | Temp 98.7°F | Ht 73.5 in | Wt 228.8 lb

## 2017-09-12 DIAGNOSIS — F411 Generalized anxiety disorder: Secondary | ICD-10-CM | POA: Diagnosis not present

## 2017-09-12 DIAGNOSIS — E785 Hyperlipidemia, unspecified: Secondary | ICD-10-CM

## 2017-09-12 DIAGNOSIS — Z23 Encounter for immunization: Secondary | ICD-10-CM | POA: Diagnosis not present

## 2017-09-12 DIAGNOSIS — Z0000 Encounter for general adult medical examination without abnormal findings: Secondary | ICD-10-CM | POA: Diagnosis not present

## 2017-09-12 DIAGNOSIS — I639 Cerebral infarction, unspecified: Secondary | ICD-10-CM | POA: Diagnosis not present

## 2017-09-12 DIAGNOSIS — Z7901 Long term (current) use of anticoagulants: Secondary | ICD-10-CM | POA: Diagnosis not present

## 2017-09-12 LAB — BASIC METABOLIC PANEL
BUN: 15 mg/dL (ref 6–23)
CALCIUM: 9.5 mg/dL (ref 8.4–10.5)
CHLORIDE: 102 meq/L (ref 96–112)
CO2: 25 mEq/L (ref 19–32)
CREATININE: 1.01 mg/dL (ref 0.40–1.50)
GFR: 82.23 mL/min (ref >60.00)
Glucose, Bld: 95 mg/dL (ref 70–99)
Potassium: 4.4 mEq/L (ref 3.5–5.1)
Sodium: 137 mEq/L (ref 135–145)

## 2017-09-12 LAB — CBC
HEMATOCRIT: 40.6 % (ref 39.0–52.0)
Hemoglobin: 13.9 g/dL (ref 13.0–17.0)
MCHC: 34.3 g/dL (ref 30.0–36.0)
MCV: 97 fl (ref 78.0–100.0)
Platelets: 162 10*3/uL (ref 150.0–400.0)
RBC: 4.18 Mil/uL — AB (ref 4.22–5.81)
RDW: 13.5 % (ref 11.5–15.5)
WBC: 7.1 10*3/uL (ref 4.0–10.5)

## 2017-09-12 LAB — LIPID PANEL
CHOLESTEROL: 166 mg/dL (ref 0–200)
HDL: 59.7 mg/dL (ref >39.00)
LDL Cholesterol: 87 mg/dL (ref 0–99)
NonHDL: 106.57
TRIGLYCERIDES: 97 mg/dL (ref 0.0–149.0)
Total CHOL/HDL Ratio: 3
VLDL: 19.4 mg/dL (ref 0.0–40.0)

## 2017-09-12 LAB — HEMOGLOBIN A1C: HEMOGLOBIN A1C: 5.1 % (ref 4.6–6.5)

## 2017-09-12 NOTE — Patient Instructions (Signed)
BEFORE YOU LEAVE: -flu shot -labs -follow up: 6 months  We have ordered labs or studies at this visit. It can take up to 1-2 weeks for results and processing. IF results require follow up or explanation, we will call you with instructions. Clinically stable results will be released to your [facility]. If you have not heard from [place] or cannot find your results in [facility] in 2 weeks please contact our office at [phone].  If you are not yet signed up for [facility], please consider signing up.  WE NOW OFFER   [facility] FAST TRACK!!!  SAME DAY Appointments for ACUTE CARE  Such as: Sprains, Injuries, cuts, abrasions, rashes, muscle pain, joint pain, back pain Colds, flu, sore throats, headache, allergies, cough, fever  Ear pain, sinus and eye infections Abdominal pain, nausea, vomiting, diarrhea, upset stomach Animal/insect bites  3 Easy Ways to Schedule: Walk-In Scheduling Call in scheduling Mychart Sign-up: [email or web address]    Health Maintenance, Male A healthy lifestyle and preventive care is important for your health and wellness. Ask your health care provider about what schedule of regular examinations is right for you. What should I know about weight and diet? Eat a Healthy Diet  Eat plenty of vegetables, fruits, whole grains, low-fat dairy products, and lean protein.  Do not eat a lot of foods high in solid fats, added sugars, or salt.  Maintain a Healthy Weight Regular exercise can help you achieve or maintain a healthy weight. You should:  Do at least 150 minutes of exercise each week. The exercise should increase your heart rate and make you sweat (moderate-intensity exercise).  Do strength-training exercises at least twice a week.  Watch Your Levels of Cholesterol and Blood Lipids  Have your blood tested for lipids and cholesterol every 5 years starting at 52 years of age. If you are at high risk for heart disease, you should start having  your blood tested when you are 52 years old. You may need to have your cholesterol levels checked more often if: ? Your lipid or cholesterol levels are high. ? You are older than 52 years of age. ? You are at high risk for heart disease.  What should I know about cancer screening? Many types of cancers can be detected early and may often be prevented. Lung Cancer  You should be screened every year for lung cancer if: ? You are a current smoker who has smoked for at least 30 years. ? You are a former smoker who has quit within the past 15 years.  Talk to your health care provider about your screening options, when you should start screening, and how often you should be screened.  Colorectal Cancer  Routine colorectal cancer screening usually begins at 52 years of age and should be repeated every 5-10 years until you are 52 years old. You may need to be screened more often if early forms of precancerous polyps or small growths are found. Your health care provider may recommend screening at an earlier age if you have risk factors for colon cancer.  Your health care provider may recommend using home test kits to check for hidden blood in the stool.  A small camera at the end of a tube can be used to examine your colon (sigmoidoscopy or colonoscopy). This checks for the earliest forms of colorectal cancer.  Prostate and Testicular Cancer  Depending on your age and overall health, your health care provider may do certain tests to screen for prostate and testicular cancer.  Talk to your health care provider about any symptoms or concerns you have about testicular or prostate cancer.  Skin Cancer  Check your skin from head to toe regularly.  Tell your health care provider about any new moles or changes in moles, especially if: ? There is a change in a mole's size, shape, or color. ? You have a mole that is larger than a pencil eraser.  Always use sunscreen. Apply sunscreen liberally and  repeat throughout the day.  Protect yourself by wearing long sleeves, pants, a wide-brimmed hat, and sunglasses when outside.  What should I know about heart disease, diabetes, and high blood pressure?  If you are 90-32 years of age, have your blood pressure checked every 3-5 years. If you are 73 years of age or older, have your blood pressure checked every year. You should have your blood pressure measured twice-once when you are at a hospital or clinic, and once when you are not at a hospital or clinic. Record the average of the two measurements. To check your blood pressure when you are not at a hospital or clinic, you can use: ? An automated blood pressure machine at a pharmacy. ? A home blood pressure monitor.  Talk to your health care provider about your target blood pressure.  If you are between 14-74 years old, ask your health care provider if you should take aspirin to prevent heart disease.  Have regular diabetes screenings by checking your fasting blood sugar level. ? If you are at a normal weight and have a low risk for diabetes, have this test once every three years after the age of 41. ? If you are overweight and have a high risk for diabetes, consider being tested at a younger age or more often.  A one-time screening for abdominal aortic aneurysm (AAA) by ultrasound is recommended for men aged 65-75 years who are current or former smokers. What should I know about preventing infection? Hepatitis B If you have a higher risk for hepatitis B, you should be screened for this virus. Talk with your health care provider to find out if you are at risk for hepatitis B infection. Hepatitis C Blood testing is recommended for:  Everyone born from [birth year] through 1966.  Anyone with known risk factors for hepatitis C.  Sexually Transmitted Diseases (STDs)  You should be screened each year for STDs including gonorrhea and chlamydia if: ? You are sexually active and are younger than 52  years of age. ? You are older than 52 years of age and your health care provider tells you that you are at risk for this type of infection. ? Your sexual activity has changed since you were last screened and you are at an increased risk for chlamydia or gonorrhea. Ask your health care provider if you are at risk.  Talk with your health care provider about whether you are at high risk of being infected with HIV. Your health care provider may recommend a prescription medicine to help prevent HIV infection.  What else can I do?  Schedule regular health, dental, and eye exams.  Stay current with your vaccines (immunizations).  Do not use any tobacco products, such as cigarettes, chewing tobacco, and e-cigarettes. If you need help quitting, ask your health care provider.  Limit alcohol intake to no more than 2 drinks per day. One drink equals 12 ounces of beer, 5 ounces of wine, or 1 ounces of hard liquor.  Do not use street drugs.  Do  not share needles.  Ask your health care provider for help if you need support or information about quitting drugs.  Tell your health care provider if you often feel depressed.  Tell your health care provider if you have ever been abused or do not feel safe at home. This information is not intended to replace advice given to you by your health care provider. Make sure you discuss any questions you have with your health care provider. Document Released: 05/24/2008 Document Revised: 07/25/2016 Document Reviewed: 08/30/2015 Elsevier Interactive Patient Education  [employer].

## 2017-09-30 ENCOUNTER — Other Ambulatory Visit: Payer: Self-pay | Admitting: Family Medicine

## 2017-09-30 ENCOUNTER — Other Ambulatory Visit: Payer: Self-pay | Admitting: Neurology

## 2017-10-15 DIAGNOSIS — H5213 Myopia, bilateral: Secondary | ICD-10-CM | POA: Diagnosis not present

## 2017-10-15 DIAGNOSIS — H524 Presbyopia: Secondary | ICD-10-CM | POA: Diagnosis not present

## 2017-10-17 ENCOUNTER — Other Ambulatory Visit: Payer: Self-pay | Admitting: Cardiovascular Disease

## 2017-10-28 ENCOUNTER — Ambulatory Visit: Payer: BLUE CROSS/BLUE SHIELD | Admitting: Cardiovascular Disease

## 2017-10-30 ENCOUNTER — Encounter: Payer: Self-pay | Admitting: Cardiovascular Disease

## 2017-10-30 ENCOUNTER — Ambulatory Visit: Payer: BLUE CROSS/BLUE SHIELD | Admitting: Cardiovascular Disease

## 2017-10-30 VITALS — BP 104/62 | HR 66 | Ht 73.5 in | Wt 226.4 lb

## 2017-10-30 DIAGNOSIS — I482 Chronic atrial fibrillation, unspecified: Secondary | ICD-10-CM

## 2017-10-30 DIAGNOSIS — I447 Left bundle-branch block, unspecified: Secondary | ICD-10-CM | POA: Diagnosis not present

## 2017-10-30 NOTE — Patient Instructions (Signed)
Medication Instructions:  Your physician recommends that you continue on your current medications as directed. Please refer to the Current Medication list given to you today.   Labwork: None Ordered   Testing/Procedures: None Ordered   Follow-Up: Your physician wants you to follow-up in: 1 year with Dr. Nahser.  You will receive a reminder letter in the mail two months in advance. If you don't receive a letter, please call our office to schedule the follow-up appointment.   If you need a refill on your cardiac medications before your next appointment, please call your pharmacy.   Thank you for choosing [facility]! Siomara Burkel, RN [phone]    

## 2017-10-30 NOTE — Progress Notes (Signed)
Dennis Cortez Date of Birth  09/15/1966       [facility]    [place] [address]  [address] [place], [place]  [address]   [place], [place]  [address] [phone]     [phone]   Fax  [phone]    Fax [phone]  Problem List: 1. Atrial Fibrillation 2. Hyperlipidemia    Michaela is doing well.  Asymptomatic.  His last echo was 3 years ago.  He is working out at [employer] on a regular basis.  He has never had any symptoms related to his A-Fib.  His CHADS score is 0  March 29, 2014:  Dene is doing well.  Not working out as much as he would like . Doing well from a cardiac standpoint.  No CP or dyspnea.     July 30, 2014: Zaveon is seen today for followup of his atrial fibrillation. He's been maintained on aspirin because of his low chads Vasc score.  He presented several months after I saw him with some visual disturbances and headache  and was found stroke. He was started on Xarelto at that time.   He feels ok but is still having some headaches.    Sept. 19, 2016:    Doing well.  Has been walking.     No CP or dyspnea.    Oct. 4, 2017:  Salome is seen today for follow-up of his atrial fibrillation and LBBB. Tough year. His mother died this past year. managment changes at work   Nov. 21, 2018: Jacorey is seen today for a follow-up of his chronic atrial fibrillation and left bundle branch block. Echocardiogram in March, 2014 showed normal left ventricular systolic function. Has had Afib for 20+ years.   We have not tried cardioversion.   We discussed the fact that cardioversion would likely not be successful Work has been stressful.     Current Outpatient Medications on File Prior to Visit  Medication Sig Dispense Refill  . colchicine 0.6 MG tablet TAKE 2 TABS AT FIRST FIGN OF GOUT, MAY REPEAT 1 RAB IN AN HOUR. TAKE DAILY AFTER FOR UP TO 10 DAYS 30 tablet 1  . fish oil-omega-3 fatty acids 1000 MG capsule Take 2 g  by mouth daily.      . Multiple Vitamin (MULTIVITAMIN) tablet Take 1 tablet by mouth daily.      Marland Kitchen omeprazole (PRILOSEC) 20 MG capsule TAKE ONE CAPSULE BY MOUTH EVERY DAY 90 capsule 0  . rosuvastatin (CRESTOR) 10 MG tablet TAKE 1 TABLET BY MOUTH EVERY DAY 90 tablet 2  . venlafaxine XR (EFFEXOR-XR) 75 MG 24 hr capsule TAKE ONE CAPSULE EVERY DAY 90 capsule 1  . XARELTO 20 MG TABS tablet TAKE 1 TABLET BY MOUTH EVERY DAY WITH SUPPER 90 tablet 3   No current facility-administered medications on file prior to visit.     No Known Allergies  Past Medical History:  Diagnosis Date  . Atrial fibrillation (HCC)   . H/O exercise stress test 02/2013   a. ETT-Echo with normal images but significant ST depression and hypotensive response to exercise  . Hx of cardiac catheterization    LHC 03/09/13:  Smooth and normal coronary arteries, normal LVF  . Hyperlipidemia   . LVH (left ventricular hypertrophy)    a. Echo 02/2013:  Mild LVH, EF 55-65%, normal wall motion, mild LAE, mild RVE, mild RAE   . MVP (mitral valve prolapse)  Past Surgical History:  Procedure Laterality Date  . KNEE SURGERY    . US ECHOCARDIOGRAPHY  09/01/2008   EF 55-60%  . US ECHOCARDIOGRAPHY  08/17/2005   EF 55-60%    Social History   Tobacco Use  Smoking Status Never Smoker  Smokeless Tobacco Never Used    Social History   Substance and Sexual Activity  Alcohol Use Yes  . Alcohol/week: 0.0 oz   Comment: 2 beers per day    Family History  Adopted: Yes    Reviw of Systems:  Reviewed in the HPI.  All other systems are negative.   Physical Exam: Height 6' 1.5" (1.867 m), weight 226 lb 6.4 oz (102.7 kg).  GEN:  Well nourished, well developed in no acute distress HEENT: Normal NECK: No JVD; No carotid bruits LYMPHATICS: No lymphadenopathy CARDIAC: Irreg. Irreg. , no murmurs, rubs, gallops RESPIRATORY:  Clear to auscultation without rales, wheezing or rhonchi  ABDOMEN: Soft, non-tender,  non-distended MUSCULOSKELETAL:  No edema; No deformity  SKIN: Warm and dry NEUROLOGIC:  Alert and oriented x 3   ECG: October 30, 2017: Atrial fibrillation at a rate of 66.  He has a left bundle branch block.  No changes from previous EKG.  Assessment / Plan:   1. Atrial Fibrillation  -Davionte remained stable.  He is basically asymptomatic.    Continue current meds.  Will get a repeat echo next year  Continue Xarelto 20 mg a day   2. Hyperlipidemia - .  Continue crestor.   He had recent lab work at his medical doctor's office.  His LDL is 87.  Total cholesterol is 166.  3. CVA:   No recurrent symptoms of stroke or TIA.  He has been on Xarelto.  4. LBBB -stable.  His left ventricular function is unchanged.  Will repeat his echocardiogram next year.    Kristeen MissPhilip Temia Debroux, MD  10/30/2017 11:39 AM    [facility] [address]  [address] [place], [place]  [address] Pager [phone] Phone: [phone]; Fax: [phone]

## 2017-12-01 ENCOUNTER — Other Ambulatory Visit: Payer: Self-pay | Admitting: Internal Medicine

## 2017-12-09 ENCOUNTER — Telehealth: Payer: Self-pay | Admitting: *Deleted

## 2017-12-09 NOTE — Telephone Encounter (Signed)
Dennis Cortez walked into office with c/o worsening rash to medial aspect of bilateral thighs and tops of feet x1.5 weeks. Denies fever. Describes rash as "itchy at times, almost like a blister, and then it will scab over."  Dennis Cortez is alert, well appearing, and in no distress. Sparse red/pink papules scattered over medial thigh and bilateral feet. No open or draining lesions noted.  BP: 132/82 P: 92 O2: 98 RR: 16  No availability in office today. Discussed w/ PCP who recommended appt when we reopen on Wednesday or w/ PCP on Thursday. Dennis Cortez scheduled appt w/ Dr. Caryl Never on 12/11/17. Instructed Dennis Cortez to seek immediate medical attention for worsening/alarm symptoms and Dennis Cortez agreed to plan.

## 2017-12-11 ENCOUNTER — Encounter: Payer: Self-pay | Admitting: Family Medicine

## 2017-12-11 ENCOUNTER — Ambulatory Visit: Payer: BLUE CROSS/BLUE SHIELD | Admitting: Family Medicine

## 2017-12-11 VITALS — BP 110/70 | HR 76 | Temp 98.2°F | Wt 231.4 lb

## 2017-12-11 DIAGNOSIS — R21 Rash and other nonspecific skin eruption: Secondary | ICD-10-CM

## 2017-12-11 MED ORDER — PREDNISONE 10 MG PO TABS: ORAL_TABLET | ORAL | 0 refills | Status: DC

## 2017-12-11 NOTE — Progress Notes (Signed)
Subjective:     Patient ID: Dennis Cortez, male   DOB: 1966-12-04, 52 y.o.   MRN: [id number]  HPI Patient seen for skin rash mostly feet and to lesser extent legs noted about a week ago. Started as very small "blisters" to small papules. They seem to be clearing some.  No fever. Nonpainful. No history of similar rash. No upper respiratory symptoms. No recent sore throat.  Generally feels well. He has history of cardioembolic stroke and atrial fibrillation.  On chronic anticoagulation with Xarelto.  Past Medical History:  Diagnosis Date  . Atrial fibrillation (HCC)   . H/O exercise stress test 02/2013   a. ETT-Echo with normal images but significant ST depression and hypotensive response to exercise  . Hx of cardiac catheterization    LHC 03/09/13:  Smooth and normal coronary arteries, normal LVF  . Hyperlipidemia   . LVH (left ventricular hypertrophy)    a. Echo 02/2013:  Mild LVH, EF 55-65%, normal wall motion, mild LAE, mild RVE, mild RAE   . MVP (mitral valve prolapse)    Past Surgical History:  Procedure Laterality Date  . KNEE SURGERY    . US ECHOCARDIOGRAPHY  09/01/2008   EF 55-60%  . US ECHOCARDIOGRAPHY  08/17/2005   EF 55-60%    reports that  has never smoked. he has never used smokeless tobacco. He reports that he drinks alcohol. He reports that he does not use drugs. family history is not on file. He was adopted. No Known Allergies   Review of Systems  Constitutional: Negative for chills and fever.  Skin: Positive for rash.  Hematological: Negative for adenopathy. Does not bruise/bleed easily.       Objective:   Physical Exam  Constitutional: He appears well-developed and well-nourished.  Cardiovascular: Normal rate and regular rhythm.  Pulmonary/Chest: Effort normal and breath sounds normal. No respiratory distress. He has no wheezes. He has no rales.  Skin:  He has a few scattered small erythematous papules on the feet and lower legs and a few on the thighs. These  blanch slightly with pressure. Nonscaly. Nonpustular       Assessment:     Nonspecific rash lower extremities. Doubt these represent any type of bite.  Non-petechial.  Distribution not compatible with dyshidrosis.  Per pt, some already fading.    Plan:     -Recommend try over-the-counter anti-histamine such as Zyrtec or Allegra -Wrote for brief prednisone taper to try if not clearing with the antihistamine first. -follow up for any progressive or persistent rash.  Kristian Covey MD [facility]

## 2017-12-11 NOTE — Patient Instructions (Signed)
Try OTC anti-histamine such as Zyrtec or Allegra Consider prednisone if not clearing with the above.

## 2017-12-19 ENCOUNTER — Encounter: Payer: Self-pay | Admitting: Family Medicine

## 2018-03-10 ENCOUNTER — Other Ambulatory Visit: Payer: Self-pay | Admitting: Internal Medicine

## 2018-03-12 ENCOUNTER — Encounter: Payer: Self-pay | Admitting: Family Medicine

## 2018-03-12 NOTE — Progress Notes (Signed)
HPI:  Using dictation device. Unfortunately this device frequently misinterprets words/phrases.  PMH A. Fib, LBB (sees cardiologist - Dr. Elease Hashimoto), cardioembolic stroke and migraines (sees neurologist - Dr. Everlena Cooper), GERD, Gout, HLD and anxiety here for follow up. Labs looked good back in October.  Reports he is doing well.  He has gained some weight.  He has started working on his diet and is going to start exercising.  Goal to lose about 10-20 pounds over the next 4 months.  Reports his mood has been good on the Effexor and wishes to continue it.  He has had no acid reflux symptoms.  He has not had any recurrent gout symptoms.  Denies any chest pain, shortness of breath, swelling, depression.   ROS: See pertinent positives and negatives per HPI.  Past Medical History:  Diagnosis Date  . Atrial fibrillation (HCC)   . Cardioembolic stroke (HCC) 05/19/2015  . Chronic anticoagulation 09/15/2015  . Esophageal reflux 09/15/2015  . Generalized anxiety disorder 05/19/2015  . H/O exercise stress test 02/2013   a. ETT-Echo with normal images but significant ST depression and hypotensive response to exercise  . Hx of cardiac catheterization    LHC 03/09/13:  Smooth and normal coronary arteries, normal LVF  . Hyperlipemia 10/22/2007   Qualifier: Diagnosis of  By: Alphonzo Severance MD, Loni Dolly   . Hyperlipidemia   . LVH (left ventricular hypertrophy)    a. Echo 02/2013:  Mild LVH, EF 55-65%, normal wall motion, mild LAE, mild RVE, mild RAE   . Migraine without aura and without status migrainosus, not intractable 05/19/2015  . MVP (mitral valve prolapse)     Past Surgical History:  Procedure Laterality Date  . KNEE SURGERY    . US ECHOCARDIOGRAPHY  09/01/2008   EF 55-60%  . 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  colchicine 0.6 MG tablet, TAKE 2  TABS AT FIRST FIGN OF GOUT, MAY REPEAT 1 RAB IN AN HOUR. TAKE DAILY AFTER FOR UP TO 10 DAYS, Disp: 30 tablet, Rfl: 1 .  fish oil-omega-3 fatty acids 1000 MG capsule, Take 2 g by mouth daily.  , Disp: , Rfl:  .  Multiple Vitamin (MULTIVITAMIN) tablet, Take 1 tablet by mouth daily.  , Disp: , Rfl:  .  omeprazole (PRILOSEC) 20 MG capsule, TAKE 1 CAPSULE BY MOUTH EVERY DAY, Disp: 90 capsule, Rfl: 0 .  rosuvastatin (CRESTOR) 10 MG tablet, TAKE 1 TABLET BY MOUTH EVERY DAY, Disp: 90 tablet, Rfl: 2 .  venlafaxine XR (EFFEXOR-XR) 75 MG 24 hr capsule, TAKE ONE CAPSULE EVERY DAY, Disp: 90 capsule, Rfl: 1 .  XARELTO 20 MG TABS tablet, TAKE 1 TABLET BY MOUTH EVERY DAY WITH SUPPER, Disp: 90 tablet, Rfl: 3  EXAM:  Vitals:   03/13/18 0751  BP: 100/60  Pulse: 75  Temp: 98.3 F (36.8 C)    Body mass index is 30.18 kg/m.  GENERAL: vitals reviewed and listed above, alert, oriented, appears well hydrated and in no acute distress  HEENT: atraumatic, conjunttiva clear, no obvious abnormalities on inspection of external nose and ears  NECK: no obvious masses on inspection  LUNGS: clear to auscultation bilaterally, no wheezes, rales or rhonchi, good air movement  CV: HRRR, no peripheral edema  MS: moves all extremities without noticeable abnormality  PSYCH:  pleasant and cooperative, no obvious depression or anxiety  ASSESSMENT AND PLAN:  Discussed the following assessment and plan:  Atrial fibrillation, unspecified type (HCC)  History of stroke  Chronic anticoagulation  Gastroesophageal reflux disease, esophagitis presence not specified  Generalized anxiety disorder  Hyperlipidemia, unspecified hyperlipidemia type  -Congratulated on desire to change lifestyle, encouraged a healthy low sugar and low-fat diet with regular aerobic exercise of at least 150 minutes/week. -Goal of 10-20 pound weight loss over the next 4-6 months -Consideration of stopping the PPI since no symptoms, advised he  could see if he can get by on as needed use -Labs per orders -Physical in October or November -Follow-up sooner as needed  Patient Instructions  BEFORE YOU LEAVE: -Labs -follow up: Physical in October or November  Could try taking the Prilosec as needed instead of daily.  We have ordered labs or studies at this visit. It can take up to 1-2 weeks for results and processing. IF results require follow up or explanation, we will call you with instructions. Clinically stable results will be released to your [facility]. If you have not heard from [place] or cannot find your results in [facility] in 2 weeks please contact our office at [phone].  If you are not yet signed up for [facility], please consider signing up.  We recommend the following healthy lifestyle for LIFE: 1) Small portions. But, make sure to get regular (at least 3 per day), healthy meals and small healthy snacks if needed.  2) Eat a healthy clean diet.   TRY TO EAT: -at least 5-7 servings of low sugar, colorful, and nutrient rich vegetables per day (not corn, potatoes or bananas.) -berries are the best choice if you wish to eat fruit (only eat small amounts if trying to reduce weight)  -lean meets (fish, white meat of chicken or [place]) -vegan proteins for some meals - beans or tofu, whole grains, nuts and seeds -Replace bad fats with good fats - good fats include: fish, nuts and seeds, canola oil, olive oil -small amounts of low fat or non fat dairy -small amounts of100 % whole grains - check the lables -drink plenty of water  AVOID: -SUGAR, sweets, anything with added sugar, corn syrup or sweeteners - must read labels as even foods advertised as "healthy" often are loaded with sugar -if you must have a sweetener, small amounts of stevia may be best -sweetened beverages and artificially sweetened beverages -simple starches (rice, bread, potatoes, pasta, chips, etc - small amounts of 100% whole grains are ok) -red meat, pork,  butter -fried foods, fast food, processed food, excessive dairy, eggs and coconut.  3)Get at least 150 minutes of sweaty aerobic exercise per week.  4)Reduce stress - consider counseling, meditation and relaxation to balance other aspects of your life.         Terressa Koyanagi, DO

## 2018-03-13 ENCOUNTER — Encounter: Payer: Self-pay | Admitting: Family Medicine

## 2018-03-13 ENCOUNTER — Ambulatory Visit: Payer: BLUE CROSS/BLUE SHIELD | Admitting: Family Medicine

## 2018-03-13 VITALS — BP 100/60 | HR 75 | Temp 98.3°F | Ht 73.5 in | Wt 231.9 lb

## 2018-03-13 DIAGNOSIS — E785 Hyperlipidemia, unspecified: Secondary | ICD-10-CM | POA: Diagnosis not present

## 2018-03-13 DIAGNOSIS — K219 Gastro-esophageal reflux disease without esophagitis: Secondary | ICD-10-CM

## 2018-03-13 DIAGNOSIS — F411 Generalized anxiety disorder: Secondary | ICD-10-CM | POA: Diagnosis not present

## 2018-03-13 DIAGNOSIS — I4891 Unspecified atrial fibrillation: Secondary | ICD-10-CM

## 2018-03-13 DIAGNOSIS — Z7901 Long term (current) use of anticoagulants: Secondary | ICD-10-CM

## 2018-03-13 DIAGNOSIS — Z8673 Personal history of transient ischemic attack (TIA), and cerebral infarction without residual deficits: Secondary | ICD-10-CM | POA: Diagnosis not present

## 2018-03-13 LAB — CBC WITH DIFFERENTIAL/PLATELET
BASOS ABS: 0 10*3/uL (ref 0.0–0.1)
Basophils Relative: 1.1 % (ref 0.0–3.0)
EOS ABS: 0.1 10*3/uL (ref 0.0–0.7)
Eosinophils Relative: 2.8 % (ref 0.0–5.0)
HCT: 41.8 % (ref 39.0–52.0)
HEMOGLOBIN: 14.5 g/dL (ref 13.0–17.0)
Lymphocytes Relative: 33.1 % (ref 12.0–46.0)
Lymphs Abs: 1.3 10*3/uL (ref 0.7–4.0)
MCHC: 34.7 g/dL (ref 30.0–36.0)
MCV: 95.4 fl (ref 78.0–100.0)
MONO ABS: 0.4 10*3/uL (ref 0.1–1.0)
Monocytes Relative: 10.6 % (ref 3.0–12.0)
Neutro Abs: 2.1 10*3/uL (ref 1.4–7.7)
Neutrophils Relative %: 52.4 % (ref 43.0–77.0)
Platelets: 169 10*3/uL (ref 150.0–400.0)
RBC: 4.38 Mil/uL (ref 4.22–5.81)
RDW: 13.4 % (ref 11.5–15.5)
WBC: 4.1 10*3/uL (ref 4.0–10.5)

## 2018-03-13 LAB — BASIC METABOLIC PANEL
BUN: 15 mg/dL (ref 6–23)
CO2: 26 mEq/L (ref 19–32)
CREATININE: 1.05 mg/dL (ref 0.40–1.50)
Calcium: 9.6 mg/dL (ref 8.4–10.5)
Chloride: 104 mEq/L (ref 96–112)
GFR: 78.48 mL/min (ref >60.00)
Glucose, Bld: 93 mg/dL (ref 70–99)
Potassium: 4.4 mEq/L (ref 3.5–5.1)
Sodium: 138 mEq/L (ref 135–145)

## 2018-03-13 NOTE — Patient Instructions (Signed)
BEFORE YOU LEAVE: -Labs -follow up: Physical in October or November  Could try taking the Prilosec as needed instead of daily.  We have ordered labs or studies at this visit. It can take up to 1-2 weeks for results and processing. IF results require follow up or explanation, we will call you with instructions. Clinically stable results will be released to your [facility]. If you have not heard from [place] or cannot find your results in [facility] in 2 weeks please contact our office at [phone].  If you are not yet signed up for [facility], please consider signing up.  We recommend the following healthy lifestyle for LIFE: 1) Small portions. But, make sure to get regular (at least 3 per day), healthy meals and small healthy snacks if needed.  2) Eat a healthy clean diet.   TRY TO EAT: -at least 5-7 servings of low sugar, colorful, and nutrient rich vegetables per day (not corn, potatoes or bananas.) -berries are the best choice if you wish to eat fruit (only eat small amounts if trying to reduce weight)  -lean meets (fish, white meat of chicken or [place]) -vegan proteins for some meals - beans or tofu, whole grains, nuts and seeds -Replace bad fats with good fats - good fats include: fish, nuts and seeds, canola oil, olive oil -small amounts of low fat or non fat dairy -small amounts of100 % whole grains - check the lables -drink plenty of water  AVOID: -SUGAR, sweets, anything with added sugar, corn syrup or sweeteners - must read labels as even foods advertised as "healthy" often are loaded with sugar -if you must have a sweetener, small amounts of stevia may be best -sweetened beverages and artificially sweetened beverages -simple starches (rice, bread, potatoes, pasta, chips, etc - small amounts of 100% whole grains are ok) -red meat, pork, butter -fried foods, fast food, processed food, excessive dairy, eggs and coconut.  3)Get at least 150 minutes of sweaty aerobic exercise per  week.  4)Reduce stress - consider counseling, meditation and relaxation to balance other aspects of your life.

## 2018-03-13 NOTE — Addendum Note (Signed)
Addended by: Johnella Moloney on: 03/13/2018 09:06 AM   Modules accepted: Orders

## 2018-03-28 ENCOUNTER — Other Ambulatory Visit: Payer: Self-pay | Admitting: Neurology

## 2018-04-03 ENCOUNTER — Encounter: Payer: Self-pay | Admitting: Neurology

## 2018-05-22 DIAGNOSIS — L301 Dyshidrosis [pompholyx]: Secondary | ICD-10-CM | POA: Diagnosis not present

## 2018-05-22 DIAGNOSIS — L578 Other skin changes due to chronic exposure to nonionizing radiation: Secondary | ICD-10-CM | POA: Diagnosis not present

## 2018-05-22 DIAGNOSIS — L570 Actinic keratosis: Secondary | ICD-10-CM | POA: Diagnosis not present

## 2018-05-22 DIAGNOSIS — L821 Other seborrheic keratosis: Secondary | ICD-10-CM | POA: Diagnosis not present

## 2018-06-04 ENCOUNTER — Telehealth: Payer: Self-pay | Admitting: Family Medicine

## 2018-06-04 ENCOUNTER — Other Ambulatory Visit: Payer: Self-pay | Admitting: Internal Medicine

## 2018-06-04 NOTE — Telephone Encounter (Signed)
Copied from CRM [id number]. Topic: Quick Communication - Rx Refill/Question >> Jun 04, 2018 11:38 AM Luanna Cole wrote: Medication: colchicine 0.6 MG tablet  Has the patient contacted their pharmacy?yes  Preferred Pharmacy (with phone number or street name): [id number] [place], [place] - [address] [phone] (Phone) [phone] (Fax)   Agent: Please be advised that RX refills may take up to 3 business days. We ask that you follow-up with your pharmacy.

## 2018-06-05 NOTE — Telephone Encounter (Signed)
LOV 03-13-18 with Dr. Selena Batten / Refill request for Colchicine / Last filled:  Disp Refills Start End   colchicine 0.6 MG tablet 30 tablet 1 01/23/2017    Sig: TAKE 2 TABS AT FIRST FIGN OF GOUT, MAY REPEAT 1 RAB IN AN HOUR. TAKE DAILY AFTER FOR UP TO 10 DAYS    Will route to provider for approval.

## 2018-06-06 NOTE — Telephone Encounter (Signed)
Rx denial sent to the pharmacy as per the last request note by Dr Selena Batten states the pt needs an appt prior to additional refills.

## 2018-06-13 ENCOUNTER — Other Ambulatory Visit: Payer: Self-pay | Admitting: Internal Medicine

## 2018-06-21 ENCOUNTER — Other Ambulatory Visit: Payer: Self-pay | Admitting: Internal Medicine

## 2018-06-23 NOTE — Telephone Encounter (Signed)
Copied from CRM [id number]. Topic: Quick Communication - Rx Refill/Question >> Jun 23, 2018  8:44 AM Arlyss Gandy, NT wrote: Medication: omeprazole (PRILOSEC) 20 MG capsule  Has the patient contacted their pharmacy? Yes.   (Agent: If no, request that the patient contact the pharmacy for the refill.) (Agent: If yes, when and what did the pharmacy advise?)  Preferred Pharmacy (with phone number or street name): [id number] [place], [place] - [address] [phone] (Phone) [phone] (Fax)      Agent: Please be advised that RX refills may take up to 3 business days. We ask that you follow-up with your pharmacy.

## 2018-06-28 ENCOUNTER — Other Ambulatory Visit: Payer: Self-pay | Admitting: Family Medicine

## 2018-08-28 NOTE — Progress Notes (Signed)
NEUROLOGY FOLLOW UP OFFICE NOTE  Dennis Cortez [id number]  HISTORY OF PRESENT ILLNESS: Dennis Cortez is a 52 year old left-handed male with atrial fibrillation (on Xarelto), MVP, hyperlipidemia, migraine and history of right occipital stroke and alcohol use who follows up for migraine.  UPDATE: Intensity:  Mild to moderate Duration:  Less than an hour Frequency:  Couple of times a year Frequency of abortive medication:  none Current NSAIDS:  no Current analgesics:  no Current triptans:  no Current ergotamine:  no Current anti-emetic: no Current muscle relaxants:  no Current anti-anxiolytic:  no Current sleep aide:  no Current Antihypertensive medications:  no Current Antidepressant medications:  Venlafaxine XR 75mg  daily Current Anticonvulsant medications:  no Current anti-CGRP:  no Current Vitamins/Herbal/Supplements:  no Current Antihistamines/Decongestants:  no Other therapy:  no Other medication:  no  Depression:  no; Anxiety:  no  HISTORY: On 05/24/14, he had sudden onset of vision loss while driving. He describes it as left homonymous hemianopsia with slanting up to the right. The visual disturbance was monocular, involving each eye individually. It lasted about 45 minutes and resolved. Afterwards, he developed a right retro-orbital aching pain, lasting about a day. There was no associated double vision, unilateral weakness, unilateral numbness, dizziness, or slurred speech. MRI of the brain with and without contrast revealed a small acute right occipital infarct. Carotid dopplers revealed no hemodynamically significant stenosis. He was started on Xarelto. Visual field cut resolved the next day.   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 At that time, he developed hemi-vision loss associated with wavy lines in only his left eye, and lasting 10 to 15 minutes. It was not associated with headache. He also has history of  migraines, presenting as left retro-orbital headaches, 4/10, and sometimes associated with nausea and photophobia but not visual disturbance, vomiting, phonophobia or unilateral numbness or weakness.  They can last anywhere from minutes to several hours.  They initially occurred 3 to 4 days per week.  He has not had any visual symptoms associated with them.  There are no specific triggers or relieving factors.  He is unable to take ASA or NSAIDs as per cardiology.  He does report remote history of headaches as a child, in which he had to lay down with a rag over his eyes. These were associated with nausea. They resolved after age 30 or 89. He is adopted so family history is unknown.  PAST MEDICAL HISTORY: Past Medical History:  Diagnosis Date  . Atrial fibrillation (HCC)   . Cardioembolic stroke (HCC) 05/19/2015  . Chronic anticoagulation 09/15/2015  . Esophageal reflux 09/15/2015  . Generalized anxiety disorder 05/19/2015  . H/O exercise stress test 02/2013   a. ETT-Echo with normal images but significant ST depression and hypotensive response to exercise  . Hx of cardiac catheterization    LHC 03/09/13:  Smooth and normal coronary arteries, normal LVF  . Hyperlipemia 10/22/2007   Qualifier: Diagnosis of  By: Alphonzo Severance MD, Loni Dolly   . Hyperlipidemia   . LVH (left ventricular hypertrophy)    a. Echo 02/2013:  Mild LVH, EF 55-65%, normal wall motion, mild LAE, mild RVE, mild RAE   . Migraine without aura and without status migrainosus, not intractable 05/19/2015  . MVP (mitral valve prolapse)     MEDICATIONS: Current Outpatient Medications on File Prior to Visit  Medication Sig Dispense Refill  . colchicine 0.6 MG tablet TAKE 2 TABS AT FIRST FIGN OF GOUT,  MAY REPEAT 1 RAB IN AN HOUR. TAKE DAILY AFTER FOR UP TO 10 DAYS 30 tablet 1  . fish oil-omega-3 fatty acids 1000 MG capsule Take 2 g by mouth daily.      . Multiple Vitamin (MULTIVITAMIN) tablet Take 1 tablet by mouth daily.      Marland Kitchen omeprazole  (PRILOSEC) 20 MG capsule TAKE 1 CAPSULE BY MOUTH EVERY DAY 90 capsule 0  . rosuvastatin (CRESTOR) 10 MG tablet TAKE 1 TABLET BY MOUTH EVERY DAY 90 tablet 1  . venlafaxine XR (EFFEXOR-XR) 75 MG 24 hr capsule TAKE ONE CAPSULE EVERY DAY 90 capsule 1  . XARELTO 20 MG TABS tablet TAKE 1 TABLET BY MOUTH EVERY DAY WITH SUPPER 90 tablet 3   No current facility-administered medications on file prior to visit.     ALLERGIES: No Known Allergies  FAMILY HISTORY: Family History  Adopted: Yes   SOCIAL HISTORY: Social History   Socioeconomic History  . Marital status: Married    Spouse name: Not on file  . Number of children: Not on file  . Years of education: Not on file  . Highest education level: Not on file  Occupational History  . Not on file  Social Needs  . Financial resource strain: Not on file  . Food insecurity:    Worry: Not on file    Inability: Not on file  . Transportation needs:    Medical: Not on file    Non-medical: Not on file  Tobacco Use  . Smoking status: Never Smoker  . Smokeless tobacco: Never Used  Substance and Sexual Activity  . Alcohol use: Yes    Alcohol/week: 0.0 standard drinks    Comment: 2 beers per day  . Drug use: No  . Sexual activity: Yes    Partners: Female  Lifestyle  . Physical activity:    Days per week: Not on file    Minutes per session: Not on file  . Stress: Not on file  Relationships  . Social connections:    Talks on phone: Not on file    Gets together: Not on file    Attends religious service: Not on file    Active member of club or organization: Not on file    Attends meetings of clubs or organizations: Not on file    Relationship status: Not on file  . Intimate partner violence:    Fear of current or ex partner: Not on file    Emotionally abused: Not on file    Physically abused: Not on file    Forced sexual activity: Not on file  Other Topics Concern  . Not on file  Social History Narrative  . Not on file    REVIEW  OF SYSTEMS: Constitutional: No fevers, chills, or sweats, no generalized fatigue, change in appetite Eyes: No visual changes, double vision, eye pain Ear, nose and throat: No hearing loss, ear pain, nasal congestion, sore throat Cardiovascular: No chest pain, palpitations Respiratory:  No shortness of breath at rest or with exertion, wheezes GastrointestinaI: No nausea, vomiting, diarrhea, abdominal pain, fecal incontinence Genitourinary:  No dysuria, urinary retention or frequency Musculoskeletal:  No neck pain, back pain Integumentary: No rash, pruritus, skin lesions Neurological: as above Psychiatric: No depression, insomnia, anxiety Endocrine: No palpitations, fatigue, diaphoresis, mood swings, change in appetite, change in weight, increased thirst Hematologic/Lymphatic:  No purpura, petechiae. Allergic/Immunologic: no itchy/runny eyes, nasal congestion, recent allergic reactions, rashes  PHYSICAL EXAM: Blood pressure 112/78, pulse 67, height 6\' 2"  (1.88  m), weight 232 lb (105.2 kg), SpO2 98 %. General: No acute distress.  Patient appears well-groomed.   Head:  Normocephalic/atraumatic Eyes:  Fundi examined but not visualized Neck: supple, no paraspinal tenderness, full range of motion Heart:  Regular rate and rhythm Lungs:  Clear to auscultation bilaterally Back: No paraspinal tenderness Neurological Exam: alert and oriented to person, place, and time. Attention span and concentration intact, recent and remote memory intact, fund of knowledge intact.  Speech fluent and not dysarthric, language intact.  CN II-XII intact. Bulk and tone normal, muscle strength 5/5 throughout.  Sensation to light touch, temperature and vibration intact.  Deep tendon reflexes 3+ throughout, toes downgoing.  Finger to nose and heel to shin testing intact.  Gait normal, Romberg negative.  IMPRESSION: 1. Migraine without aura, without status migrainosus, not intractable 2.  History of right occipital  ischemic infarct, cryptogenic.  May have been cardioembolic or migraine with persistent aura and infarct 3.  Atrial fibrillation 4.  Anxiety  PLAN: 1.  Venlafaxine XR 75mg  daily for anxiety and migraine prevention 2.  Continue Xarelto and Crestor as per his other physicians 3.  Limit use of pain relievers to no more than 2 days out of week to prevent risk of rebound or medication-overuse headache. 4.  Consider keeping headache diary 5.  Follow up in one year or as needed.  Shon Millet, DO  CC: Kriste Basque, DO

## 2018-08-29 ENCOUNTER — Ambulatory Visit: Payer: BLUE CROSS/BLUE SHIELD | Admitting: Neurology

## 2018-08-29 ENCOUNTER — Encounter: Payer: Self-pay | Admitting: Neurology

## 2018-08-29 VITALS — BP 112/78 | HR 67 | Ht 74.0 in | Wt 232.0 lb

## 2018-08-29 DIAGNOSIS — I63431 Cerebral infarction due to embolism of right posterior cerebral artery: Secondary | ICD-10-CM

## 2018-08-29 DIAGNOSIS — F419 Anxiety disorder, unspecified: Secondary | ICD-10-CM

## 2018-08-29 DIAGNOSIS — G43109 Migraine with aura, not intractable, without status migrainosus: Secondary | ICD-10-CM

## 2018-08-29 DIAGNOSIS — G43009 Migraine without aura, not intractable, without status migrainosus: Secondary | ICD-10-CM

## 2018-08-29 DIAGNOSIS — I4891 Unspecified atrial fibrillation: Secondary | ICD-10-CM

## 2018-08-29 NOTE — Patient Instructions (Signed)
1.  Continue venlafaxine XR 75mg  daily for anxiety and migraine prevention 2.  Continue Xarelto and Crestor 3.  Limit use of pain relievers to no more than 2 days out of week to prevent risk of rebound or medication-overuse headache. 4.  Keep headache diary 5.  Follow up in one year

## 2018-09-05 ENCOUNTER — Ambulatory Visit: Payer: BLUE CROSS/BLUE SHIELD | Admitting: Neurology

## 2018-09-17 NOTE — Progress Notes (Signed)
HPI:  Using dictation device. Unfortunately this device frequently misinterprets words/phrases.  Here for CPE:  -Concerns and/or follow up today:   Dennis Cortez is a pleasant 52 y.o. here for follow up. Chronic medical problems summarized below were reviewed for changes and stability and were updated as needed below. These issues and their treatment remain stable for the most part. Reports doing well. Occasional pain in R foot when gofling - uses ice and resolved - not sure if gout.  Denies CP, SOB, DOE, treatment intolerance or new symptoms. Sees dermatologist for skin exam twice yearly due to [place] melanoma.  A. Fib, LBBB, hx cardioembolic stroke, HLD: -sees cardiologist, Dr. Cathie Olden -meds:fish oil, crestor, xarelto  Migraines: -sees neurologist, Dr. Tomi Likens  GERD: -meds: omeprazole  Gout: -meds: colchicine prn  Anxiety: -meds: venlafaxine  -Diet: variety of foods, balance and well rounded, larger portion sizes -Exercise: no regular exercise -Diabetes and Dyslipidemia Screening: -Hx of HTN: no -Vaccines: due for flu shot -sexual activity: yes, male partner, no new partners -wants STI testing, Hep C screening (if born 11-1965): no -FH colon or prstate ca: see FH Last colon cancer screening: utd  Last prostate ca screening: discussed risks/benefits - declined  -Alcohol, Tobacco, drug use: see social history  Review of Systems - no fevers, unintentional weight loss, vision loss, hearing loss, chest pain, sob, hemoptysis, melena, hematochezia, hematuria, genital discharge, changing or concerning skin lesions, bleeding, bruising, loc, thoughts of self harm or SI  Past Medical History:  Diagnosis Date  . Atrial fibrillation ([place])   . Cardioembolic stroke ([place]) 06/14/5464  . Chronic anticoagulation 09/15/2015  . Esophageal reflux 09/15/2015  . Generalized anxiety disorder 05/19/2015  . H/O exercise stress test 02/2013   a. ETT-Echo with normal images but significant ST  depression and hypotensive response to exercise  . Hx of cardiac catheterization    LHC 03/09/13:  Smooth and normal coronary arteries, normal LVF  . Hyperlipemia 10/22/2007   Qualifier: Diagnosis of  By: Niel Hummer MD, [place] Hyperlipidemia   . LVH (left ventricular hypertrophy)    a. Echo 02/2013:  Mild LVH, EF 55-65%, normal wall motion, mild LAE, mild RVE, mild RAE   . Migraine without aura and without status migrainosus, not intractable 05/19/2015  . MVP (mitral valve prolapse)     Past Surgical History:  Procedure Laterality Date  . KNEE SURGERY    . US ECHOCARDIOGRAPHY  09/01/2008   EF 55-60%  . US ECHOCARDIOGRAPHY  08/17/2005   EF 55-60%    Family History  Adopted: Yes    Social History   Socioeconomic History  . Marital status: Married    Spouse name: Not on file  . Number of children: Not on file  . Years of education: Not on file  . Highest education level: Not on file  Occupational History  . Not on file  Social Needs  . Financial resource strain: Not on file  . Food insecurity:    Worry: Not on file    Inability: Not on file  . Transportation needs:    Medical: Not on file    Non-medical: Not on file  Tobacco Use  . Smoking status: Never Smoker  . Smokeless tobacco: Never Used  Substance and Sexual Activity  . Alcohol use: Yes    Alcohol/week: 0.0 standard drinks    Comment: 2 beers per day  . Drug use: No  . Sexual activity: Yes    Partners: Female  Lifestyle  .  Physical activity:    Days per week: Not on file    Minutes per session: Not on file  . Stress: Not on file  Relationships  . Social connections:    Talks on phone: Not on file    Gets together: Not on file    Attends religious service: Not on file    Active member of club or organization: Not on file    Attends meetings of clubs or organizations: Not on file    Relationship status: Not on file  Other Topics Concern  . Not on file  Social History Narrative  . Not on file      Current Outpatient Medications:  .  fish oil-omega-3 fatty acids 1000 MG capsule, Take 2 g by mouth daily.  , Disp: , Rfl:  .  Multiple Vitamin (MULTIVITAMIN) tablet, Take 1 tablet by mouth daily.  , Disp: , Rfl:  .  rosuvastatin (CRESTOR) 10 MG tablet, TAKE 1 TABLET BY MOUTH EVERY DAY, Disp: 90 tablet, Rfl: 1 .  venlafaxine XR (EFFEXOR-XR) 75 MG 24 hr capsule, TAKE ONE CAPSULE EVERY DAY, Disp: 90 capsule, Rfl: 1 .  XARELTO 20 MG TABS tablet, TAKE 1 TABLET BY MOUTH EVERY DAY WITH SUPPER, Disp: 90 tablet, Rfl: 3 .  colchicine 0.6 MG tablet, TAKE 2 TABS AT FIRST FIGN OF GOUT, MAY REPEAT 1 RAB IN AN HOUR. TAKE DAILY AFTER FOR UP TO 10 DAYS (Patient not taking: Reported on 09/18/2018), Disp: 30 tablet, Rfl: 1  EXAM:  Vitals:   09/18/18 0728  BP: 120/80  Pulse: 79  Temp: 98.4 F (36.9 C)  TempSrc: Oral  SpO2: 97%  Weight: 229 lb 6.4 oz (104.1 kg)  Height: '6\' 2"'$  (1.88 m)    Estimated body mass index is 29.45 kg/m as calculated from the following:   Height as of this encounter: '6\' 2"'$  (1.88 m).   Weight as of this encounter: 229 lb 6.4 oz (104.1 kg).  GENERAL: vitals reviewed and listed below, alert, oriented, appears well hydrated and in no acute distress  HEENT: head atraumatic, PERRLA, normal appearance of eyes, ears, nose and mouth. moist mucus membranes.  NECK: supple, no masses or lymphadenopathy  LUNGS: clear to auscultation bilaterally, no rales, rhonchi or wheeze  CV: HRRR, no peripheral edema or cyanosis, normal pedal pulses  ABDOMEN: bowel sounds normal, soft, non tender to palpation, no masses, no rebound or guarding  GU: deferred  SKIN: no rash or abnormal lesions  MS: normal gait, moves all extremities normally  NEURO: normal gait, speech and thought processing grossly intact, muscle tone grossly intact throughout  PSYCH: normal affect, pleasant and cooperative  ASSESSMENT AND PLAN:  Discussed the following assessment and plan:  PREVENTIVE  EXAM: -Discussed and advised all [place] preventive services health task force level A and B recommendations for age, sex and risks. -Advised at least 150 minutes of exercise per week and a healthy diet with avoidance of (less then 1 serving per week) processed foods, white starches, red meat, fast foods and sweets and consisting of: * 5-9 servings of fresh fruits and vegetables (not corn or potatoes) *nuts and seeds, beans *olives and olive oil *lean meats such as fish and white chicken  *whole grains -labs, studies and vaccines per orders this encounter - Basic metabolic panel - CBC - Hemoglobin A1c  2. Hyperlipidemia, unspecified hyperlipidemia type - Lipid panel  3. Atrial fibrillation, unspecified type [facility]) -sees cardiology for management, on xarelto -will check labs today  4. Generalized  anxiety disorder -doing well, on venlafaxine for migraines  5. Migraine without aura and without status migrainosus, not intractable -sees neurology, on venlafaxine for this, reports doing well  6. Gout, unspecified cause, unspecified chronicity, unspecified site -? Gout vs OA in feet, check lab, consider allopurinol if elevated, discussed - Uric Acid  7. Need for immunization against influenza - Flu Vaccine QUAD 36+ mos IM  8. FHx: melanoma -seeing dermatology twice yearly   Patient Instructions  BEFORE YOU LEAVE: -flu shot -labs -follow up: 6 months  We have ordered labs or studies at this visit. It can take up to 1-2 weeks for results and processing. IF results require follow up or explanation, we will call you with instructions. Clinically stable results will be released to your [facility]. If you have not heard from [place] or cannot find your results in [facility] in 2 weeks please contact our office at [phone].  If you are not yet signed up for [facility], please consider signing up.   We recommend the following healthy lifestyle for LIFE: 1) Small portions. But, make sure to get  regular (at least 3 per day), healthy meals and small healthy snacks if needed.  2) Eat a healthy clean diet.   TRY TO EAT: -at least 5-7 servings of low sugar, colorful, and nutrient rich vegetables per day (not corn, potatoes or bananas.) -berries are the best choice if you wish to eat fruit (only eat small amounts if trying to reduce weight)  -lean meets (fish, white meat of chicken or [place]) -vegan proteins for some meals - beans or tofu, whole grains, nuts and seeds -Replace bad fats with good fats - good fats include: fish, nuts and seeds, canola oil, olive oil -small amounts of low fat or non fat dairy -small amounts of100 % whole grains - check the lables -drink plenty of water  AVOID: -SUGAR, sweets, anything with added sugar, corn syrup or sweeteners - must read labels as even foods advertised as "healthy" often are loaded with sugar -if you must have a sweetener, small amounts of stevia may be best -sweetened beverages and artificially sweetened beverages -simple starches (rice, bread, potatoes, pasta, chips, etc - small amounts of 100% whole grains are ok) -red meat, pork, butter -fried foods, fast food, processed food, excessive dairy, eggs and coconut.  3)Get at least 150 minutes of sweaty aerobic exercise per week.  4)Reduce stress - consider counseling, meditation and relaxation to balance other aspects of your life.    Preventive Care 40-64 Years, Male Preventive care refers to lifestyle choices and visits with your health care provider that can promote health and wellness. What does preventive care include?  A yearly physical exam. This is also called an annual well check.  Dental exams once or twice a year.  Routine eye exams. Ask your health care provider how often you should have your eyes checked.  Personal lifestyle choices, including: ? Daily care of your teeth and gums. ? Regular physical activity. ? Eating a healthy diet. ? Avoiding tobacco and  drug use. ? Limiting alcohol use. ? Practicing safe sex. What happens during an annual well check? The services and screenings done by your health care provider during your annual well check will depend on your age, overall health, lifestyle risk factors, and family history of disease. Counseling Your health care provider may ask you questions about your:  Alcohol use.  Tobacco use.  Drug use.  Emotional well-being.  Home and relationship well-being.  Sexual activity.  Eating habits.  Work and work Statistician.  Screening You may have the following tests or measurements:  Height, weight, and BMI.  Blood pressure.  Lipid and cholesterol levels. These may be checked every 5 years, or more frequently if you are over 21 years old.  Skin check.  Lung cancer screening. You may have this screening every year starting at age 24 if you have a 30-pack-year history of smoking and currently smoke or have quit within the past 15 years.  Fecal occult blood test (FOBT) of the stool. You may have this test every year starting at age 63.  Flexible sigmoidoscopy or colonoscopy. You may have a sigmoidoscopy every 5 years or a colonoscopy every 10 years starting at age 32.  Prostate cancer screening. Recommendations will vary depending on your family history and other risks.  Hepatitis C blood test.  Hepatitis B blood test.  Sexually transmitted disease (STD) testing.  Diabetes screening. This is done by checking your blood sugar (glucose) after you have not eaten for a while (fasting). You may have this done every 1-3 years.  Discuss your test results, treatment options, and if necessary, the need for more tests with your health care provider. Vaccines Your health care provider may recommend certain vaccines, such as:  Influenza vaccine. This is recommended every year.  Tetanus, diphtheria, and acellular pertussis (Tdap, Td) vaccine. You may need a Td booster every 10  years.  Varicella vaccine. You may need this if you have not been vaccinated.  Zoster vaccine. You may need this after age 23.  Measles, mumps, and rubella (MMR) vaccine. You may need at least one dose of MMR if you were born in 1957 or later. You may also need a second dose.  Pneumococcal 13-valent conjugate (PCV13) vaccine. You may need this if you have certain conditions and have not been vaccinated.  Pneumococcal polysaccharide (PPSV23) vaccine. You may need one or two doses if you smoke cigarettes or if you have certain conditions.  Meningococcal vaccine. You may need this if you have certain conditions.  Hepatitis A vaccine. You may need this if you have certain conditions or if you travel or work in places where you may be exposed to hepatitis A.  Hepatitis B vaccine. You may need this if you have certain conditions or if you travel or work in places where you may be exposed to hepatitis B.  Haemophilus influenzae type b (Hib) vaccine. You may need this if you have certain risk factors.  Talk to your health care provider about which screenings and vaccines you need and how often you need them. This information is not intended to replace advice given to you by your health care provider. Make sure you discuss any questions you have with your health care provider. Document Released: 12/23/2015 Document Revised: 08/15/2016 Document Reviewed: 09/27/2015 Elsevier Interactive Patient Education  2018 [employer].     No follow-ups on file.   Lucretia Kern, DO

## 2018-09-18 ENCOUNTER — Encounter: Payer: Self-pay | Admitting: Family Medicine

## 2018-09-18 ENCOUNTER — Ambulatory Visit (INDEPENDENT_AMBULATORY_CARE_PROVIDER_SITE_OTHER): Payer: BLUE CROSS/BLUE SHIELD | Admitting: Family Medicine

## 2018-09-18 VITALS — BP 120/80 | HR 79 | Temp 98.4°F | Ht 74.0 in | Wt 229.4 lb

## 2018-09-18 DIAGNOSIS — I4891 Unspecified atrial fibrillation: Secondary | ICD-10-CM | POA: Diagnosis not present

## 2018-09-18 DIAGNOSIS — E785 Hyperlipidemia, unspecified: Secondary | ICD-10-CM

## 2018-09-18 DIAGNOSIS — F411 Generalized anxiety disorder: Secondary | ICD-10-CM | POA: Diagnosis not present

## 2018-09-18 DIAGNOSIS — M109 Gout, unspecified: Secondary | ICD-10-CM | POA: Diagnosis not present

## 2018-09-18 DIAGNOSIS — Z23 Encounter for immunization: Secondary | ICD-10-CM

## 2018-09-18 DIAGNOSIS — G43009 Migraine without aura, not intractable, without status migrainosus: Secondary | ICD-10-CM

## 2018-09-18 DIAGNOSIS — Z808 Family history of malignant neoplasm of other organs or systems: Secondary | ICD-10-CM

## 2018-09-18 DIAGNOSIS — Z0000 Encounter for general adult medical examination without abnormal findings: Secondary | ICD-10-CM | POA: Diagnosis not present

## 2018-09-18 LAB — BASIC METABOLIC PANEL
BUN: 15 mg/dL (ref 6–23)
CO2: 28 meq/L (ref 19–32)
Calcium: 10 mg/dL (ref 8.4–10.5)
Chloride: 103 mEq/L (ref 96–112)
Creatinine, Ser: 1.08 mg/dL (ref 0.40–1.50)
GFR: 75.82 mL/min (ref >60.00)
GLUCOSE: 92 mg/dL (ref 70–99)
POTASSIUM: 5 meq/L (ref 3.5–5.1)
SODIUM: 139 meq/L (ref 135–145)

## 2018-09-18 LAB — LIPID PANEL
CHOL/HDL RATIO: 3
Cholesterol: 197 mg/dL (ref 0–200)
HDL: 61.2 mg/dL (ref >39.00)
LDL CALC: 122 mg/dL — AB (ref 0–99)
NONHDL: 135.94
Triglycerides: 69 mg/dL (ref 0.0–149.0)
VLDL: 13.8 mg/dL (ref 0.0–40.0)

## 2018-09-18 LAB — CBC
HCT: 44.5 % (ref 39.0–52.0)
HEMOGLOBIN: 15.1 g/dL (ref 13.0–17.0)
MCHC: 33.9 g/dL (ref 30.0–36.0)
MCV: 97.1 fl (ref 78.0–100.0)
PLATELETS: 167 10*3/uL (ref 150.0–400.0)
RBC: 4.58 Mil/uL (ref 4.22–5.81)
RDW: 13.7 % (ref 11.5–15.5)
WBC: 4.9 10*3/uL (ref 4.0–10.5)

## 2018-09-18 LAB — URIC ACID: Uric Acid, Serum: 7.9 mg/dL — ABNORMAL HIGH (ref 4.0–7.8)

## 2018-09-18 LAB — HEMOGLOBIN A1C: Hgb A1c MFr Bld: 5.1 % (ref 4.6–6.5)

## 2018-09-18 NOTE — Patient Instructions (Addendum)
BEFORE YOU LEAVE: -flu shot -labs -follow up: 6 months  We have ordered labs or studies at this visit. It can take up to 1-2 weeks for results and processing. IF results require follow up or explanation, we will call you with instructions. Clinically stable results will be released to your MYCHART. If you have not heard from us or cannot find your results in MYCHART in 2 weeks please contact our office at [phone].  If you are not yet signed up for MYCHART, please consider signing up.   We recommend the following healthy lifestyle for LIFE: 1) Small portions. But, make sure to get regular (at least 3 per day), healthy meals and small healthy snacks if needed.  2) Eat a healthy clean diet.   TRY TO EAT: -at least 5-7 servings of low sugar, colorful, and nutrient rich vegetables per day (not corn, potatoes or bananas.) -berries are the best choice if you wish to eat fruit (only eat small amounts if trying to reduce weight)  -lean meets (fish, white meat of chicken or turkey) -vegan proteins for some meals - beans or tofu, whole grains, nuts and seeds -Replace bad fats with good fats - good fats include: fish, nuts and seeds, canola oil, olive oil -small amounts of low fat or non fat dairy -small amounts of100 % whole grains - check the lables -drink plenty of water  AVOID: -SUGAR, sweets, anything with added sugar, corn syrup or sweeteners - must read labels as even foods advertised as "healthy" often are loaded with sugar -if you must have a sweetener, small amounts of stevia may be best -sweetened beverages and artificially sweetened beverages -simple starches (rice, bread, potatoes, pasta, chips, etc - small amounts of 100% whole grains are ok) -red meat, pork, butter -fried foods, fast food, processed food, excessive dairy, eggs and coconut.  3)Get at least 150 minutes of sweaty aerobic exercise per week.  4)Reduce stress - consider counseling, meditation and relaxation to  balance other aspects of your life.    Preventive Care 40-64 Years, Male Preventive care refers to lifestyle choices and visits with your health care provider that can promote health and wellness. What does preventive care include?  A yearly physical exam. This is also called an annual well check.  Dental exams once or twice a year.  Routine eye exams. Ask your health care provider how often you should have your eyes checked.  Personal lifestyle choices, including: ? Daily care of your teeth and gums. ? Regular physical activity. ? Eating a healthy diet. ? Avoiding tobacco and drug use. ? Limiting alcohol use. ? Practicing safe sex. What happens during an annual well check? The services and screenings done by your health care provider during your annual well check will depend on your age, overall health, lifestyle risk factors, and family history of disease. Counseling Your health care provider may ask you questions about your:  Alcohol use.  Tobacco use.  Drug use.  Emotional well-being.  Home and relationship well-being.  Sexual activity.  Eating habits.  Work and work environment.  Screening You may have the following tests or measurements:  Height, weight, and BMI.  Blood pressure.  Lipid and cholesterol levels. These may be checked every 5 years, or more frequently if you are over 50 years old.  Skin check.  Lung cancer screening. You may have this screening every year starting at age 55 if you have a 30-pack-year history of smoking and currently smoke or have quit within   the past 15 years.  Fecal occult blood test (FOBT) of the stool. You may have this test every year starting at age 58.  Flexible sigmoidoscopy or colonoscopy. You may have a sigmoidoscopy every 5 years or a colonoscopy every 10 years starting at age 65.  Prostate cancer screening. Recommendations will vary depending on your family history and other risks.  Hepatitis C blood  test.  Hepatitis B blood test.  Sexually transmitted disease (STD) testing.  Diabetes screening. This is done by checking your blood sugar (glucose) after you have not eaten for a while (fasting). You may have this done every 1-3 years.  Discuss your test results, treatment options, and if necessary, the need for more tests with your health care provider. Vaccines Your health care provider may recommend certain vaccines, such as:  Influenza vaccine. This is recommended every year.  Tetanus, diphtheria, and acellular pertussis (Tdap, Td) vaccine. You may need a Td booster every 10 years.  Varicella vaccine. You may need this if you have not been vaccinated.  Zoster vaccine. You may need this after age 14.  Measles, mumps, and rubella (MMR) vaccine. You may need at least one dose of MMR if you were born in 1957 or later. You may also need a second dose.  Pneumococcal 13-valent conjugate (PCV13) vaccine. You may need this if you have certain conditions and have not been vaccinated.  Pneumococcal polysaccharide (PPSV23) vaccine. You may need one or two doses if you smoke cigarettes or if you have certain conditions.  Meningococcal vaccine. You may need this if you have certain conditions.  Hepatitis A vaccine. You may need this if you have certain conditions or if you travel or work in places where you may be exposed to hepatitis A.  Hepatitis B vaccine. You may need this if you have certain conditions or if you travel or work in places where you may be exposed to hepatitis B.  Haemophilus influenzae type b (Hib) vaccine. You may need this if you have certain risk factors.  Talk to your health care provider about which screenings and vaccines you need and how often you need them. This information is not intended to replace advice given to you by your health care provider. Make sure you discuss any questions you have with your health care provider. Document Released: 12/23/2015  Document Revised: 08/15/2016 Document Reviewed: 09/27/2015 Elsevier Interactive Patient Education  [employer].

## 2018-09-19 ENCOUNTER — Other Ambulatory Visit: Payer: Self-pay | Admitting: Family Medicine

## 2018-09-21 ENCOUNTER — Other Ambulatory Visit: Payer: Self-pay | Admitting: Neurology

## 2018-09-22 MED ORDER — ROSUVASTATIN CALCIUM 20 MG PO TABS: 20.0000 mg | ORAL_TABLET | Freq: Every day | ORAL | 1 refills | Status: DC

## 2018-09-22 MED ORDER — ALLOPURINOL 100 MG PO TABS: 100.0000 mg | ORAL_TABLET | Freq: Every day | ORAL | 1 refills | Status: DC

## 2018-09-22 NOTE — Addendum Note (Signed)
Addended by: Johnella Moloney on: 09/22/2018 02:56 PM   Modules accepted: Orders

## 2018-10-08 ENCOUNTER — Encounter: Payer: Self-pay | Admitting: Cardiovascular Disease

## 2018-10-09 ENCOUNTER — Other Ambulatory Visit: Payer: Self-pay | Admitting: Cardiovascular Disease

## 2018-10-09 NOTE — Telephone Encounter (Signed)
Xarelto 20mg  refill request received; pt is 51 yrs old, wt-104.1kg, Crea-1.08 on 09/18/18, last seen by Dr. Elease Hashimoto on 10/30/17 and has an appt scheduled for 10/31/18, CrCl-116.69ml/min; will send in refill to requested pharmacy.

## 2018-10-16 DIAGNOSIS — H524 Presbyopia: Secondary | ICD-10-CM | POA: Diagnosis not present

## 2018-10-16 DIAGNOSIS — H52203 Unspecified astigmatism, bilateral: Secondary | ICD-10-CM | POA: Diagnosis not present

## 2018-10-16 DIAGNOSIS — H5213 Myopia, bilateral: Secondary | ICD-10-CM | POA: Diagnosis not present

## 2018-10-27 ENCOUNTER — Other Ambulatory Visit (INDEPENDENT_AMBULATORY_CARE_PROVIDER_SITE_OTHER): Payer: BLUE CROSS/BLUE SHIELD

## 2018-10-27 DIAGNOSIS — M109 Gout, unspecified: Secondary | ICD-10-CM

## 2018-10-27 LAB — URIC ACID: Uric Acid, Serum: 5.8 mg/dL (ref 4.0–7.8)

## 2018-10-31 ENCOUNTER — Ambulatory Visit: Payer: BLUE CROSS/BLUE SHIELD | Admitting: Cardiovascular Disease

## 2018-10-31 ENCOUNTER — Encounter: Payer: Self-pay | Admitting: Cardiovascular Disease

## 2018-10-31 VITALS — BP 108/82 | HR 64 | Ht 74.0 in | Wt 228.0 lb

## 2018-10-31 DIAGNOSIS — I482 Chronic atrial fibrillation, unspecified: Secondary | ICD-10-CM | POA: Diagnosis not present

## 2018-10-31 DIAGNOSIS — I447 Left bundle-branch block, unspecified: Secondary | ICD-10-CM | POA: Diagnosis not present

## 2018-10-31 NOTE — Progress Notes (Signed)
Karmen Bongo Date of Birth  [birth year]       [facility]    [place] [address]  [address] [place], [place]  [address]   [place], [place]  [address] [phone]     [phone]   Fax  [phone]    Fax [phone]  Problem List: 1. Atrial Fibrillation 2. Hyperlipidemia    Awan is doing well.  Asymptomatic.  His last echo was 3 years ago.  He is working out at [employer] on a regular basis.  He has never had any symptoms related to his A-Fib.  His CHADS score is 0  March 29, 2014:  Johnwilliam is doing well.  Not working out as much as he would like . Doing well from a cardiac standpoint.  No CP or dyspnea.     July 30, 2014: Domonick is seen today for followup of his atrial fibrillation. He's been maintained on aspirin because of his low chads Vasc score.  He presented several months after I saw him with some visual disturbances and headache  and was found stroke. He was started on Xarelto at that time.   He feels ok but is still having some headaches.    Sept. 19, 2016:    Doing well.  Has been walking.     No CP or dyspnea.    Oct. 4, 2017:  Derril is seen today for follow-up of his atrial fibrillation and LBBB. Tough year. His mother died this past year. managment changes at work   Nov. 21, 2018: Jiro is seen today for a follow-up of his chronic atrial fibrillation and left bundle branch block. Echocardiogram in March, 2014 showed normal left ventricular systolic function. Has had Afib for 20+ years.   We have not tried cardioversion.   We discussed the fact that cardioversion would likely not be successful Work has been stressful.   Nov. 22, 2019:  Payce is seen today for follow-up of his atrial fibrillation and left bundle branch block. Staying active .   Working out some .   Chol and LDL are elevated.    LDL was 122 on Crestor 10 mg a day     Current Outpatient Medications on File Prior to Visit    Medication Sig Dispense Refill  . allopurinol (ZYLOPRIM) 100 MG tablet Take 1 tablet (100 mg total) by mouth daily. 90 tablet 1  . colchicine 0.6 MG tablet TAKE 2 TABS AT FIRST FIGN OF GOUT, MAY REPEAT 1 RAB IN AN HOUR. TAKE DAILY AFTER FOR UP TO 10 DAYS 30 tablet 1  . fish oil-omega-3 fatty acids 1000 MG capsule Take 2 g by mouth daily.      . Multiple Vitamin (MULTIVITAMIN) tablet Take 1 tablet by mouth daily.      . rosuvastatin (CRESTOR) 20 MG tablet Take 1 tablet (20 mg total) by mouth daily. 90 tablet 1  . venlafaxine XR (EFFEXOR-XR) 75 MG 24 hr capsule TAKE ONE CAPSULE EVERY DAY 90 capsule 1  . XARELTO 20 MG TABS tablet TAKE 1 TABLET BY MOUTH EVERY DAY WITH SUPPER 90 tablet 3   No current facility-administered medications on file prior to visit.     No Known Allergies  Past Medical History:  Diagnosis Date  . Atrial fibrillation (HCC)   . Cardioembolic stroke (HCC) 05/19/2015  . Chronic anticoagulation 09/15/2015  . Esophageal reflux 09/15/2015  . Generalized anxiety disorder 05/19/2015  . H/O exercise stress test  02/2013   a. ETT-Echo with normal images but significant ST depression and hypotensive response to exercise  . Hx of cardiac catheterization    LHC 03/09/13:  Smooth and normal coronary arteries, normal LVF  . Hyperlipemia 10/22/2007   Qualifier: Diagnosis of  By: Alphonzo Severance MD, Loni Dolly   . Hyperlipidemia   . LVH (left ventricular hypertrophy)    a. Echo 02/2013:  Mild LVH, EF 55-65%, normal wall motion, mild LAE, mild RVE, mild RAE   . Migraine without aura and without status migrainosus, not intractable 05/19/2015  . MVP (mitral valve prolapse)     Past Surgical History:  Procedure Laterality Date  . KNEE SURGERY    . US ECHOCARDIOGRAPHY  09/01/2008   EF 55-60%  . US ECHOCARDIOGRAPHY  08/17/2005   EF 55-60%    Social History   Tobacco Use  Smoking Status Never Smoker  Smokeless Tobacco Never Used    Social History   Substance and Sexual Activity  Alcohol  Use Yes  . Alcohol/week: 0.0 standard drinks   Comment: 2 beers per day    Family History  Adopted: Yes    Reviw of Systems:  Reviewed in the HPI.  All other systems are negative.   Physical Exam: Blood pressure 108/82, pulse 64, height 6\' 2"  (1.88 m), weight 228 lb (103.4 kg), SpO2 97 %.  GEN:  Well nourished, well developed in no acute distress HEENT: Normal NECK: No JVD; No carotid bruits LYMPHATICS: No lymphadenopathy CARDIAC:  Irreg. Irreg.  RESPIRATORY:  Clear to auscultation without rales, wheezing or rhonchi  ABDOMEN: Soft, non-tender, non-distended MUSCULOSKELETAL:  No edema; No deformity  SKIN: Warm and dry NEUROLOGIC:  Alert and oriented x 3   ECG: October 31, 2018: Atrial fibrillation/flutter with a heart rate of 64.  Left bundle branch block.  Assessment / Plan:   1. Atrial Fibrillation  - Has chronic AF.   HR is slow.   Seems to have an underlying conduction defect ( slow HR, LBBB)  Will get an MRI of the heart  He may have a cardiomyopathy. His LV function has been good by echo in 2014    2. Hyperlipidemia - . crestor has been increased to 20 mg  He is to recheck lipids in 3 months   3. CVA:    No further signs of CVA or TIA .       Kristeen Miss, MD  10/31/2018 8:46 AM    [facility] [address] [place],  [address] [place], [place]  [address] Pager [phone] Phone: [phone]; Fax: [phone]

## 2018-10-31 NOTE — Patient Instructions (Signed)
Medication Instructions:  Your physician recommends that you continue on your current medications as directed. Please refer to the Current Medication list given to you today.  If you need a refill on your cardiac medications before your next appointment, please call your pharmacy.   Lab work: None Ordered    Testing/Procedures: Your physician has requested that you have a cardiac MRI. Cardiac MRI uses a computer to create images of your heart as its beating, producing both still and moving pictures of your heart and major blood vessels. For further information please visit [email or web address]. Please follow the instruction sheet given to you today for more information.   Follow-Up: At [facility], you and your health needs are our priority.  As part of our continuing mission to provide you with exceptional heart care, we have created designated Provider Care Teams.  These Care Teams include your primary Cardiologist (physician) and Advanced Practice Providers (APPs -  Physician Assistants and Nurse Practitioners) who all work together to provide you with the care you need, when you need it. You will need a follow up appointment in:  1 years.  Please call our office 2 months in advance to schedule this appointment.  You may see Kristeen Miss, MD or one of the following Advanced Practice Providers on your designated Care Team: Tereso Newcomer, PA-C Vin [place], [place] . Berton Bon, NP

## 2018-11-11 ENCOUNTER — Other Ambulatory Visit: Payer: Self-pay | Admitting: Family Medicine

## 2018-11-11 MED ORDER — COLCHICINE 0.6 MG PO TABS: ORAL_TABLET | ORAL | 1 refills | Status: DC

## 2018-11-11 NOTE — Telephone Encounter (Signed)
Copied from CRM [id number]. Topic: Quick Communication - Rx Refill/Question >> Nov 11, 2018  8:12 AM Crist Infante wrote: Medication: colchicine 0.6 MG tablet Pt had cpe on 09/18/18 and forgot to ask for this med to keep on hand if he has a gout flair up. Pt states he has now gout in his right foot, and states it is pretty bad. (pt states probably all the rich thanksgiving food!)  Pt requesting refill to [id number] [place], [place] - [address] [phone] (Phone) [phone] (Fax)

## 2018-11-20 DIAGNOSIS — L821 Other seborrheic keratosis: Secondary | ICD-10-CM | POA: Diagnosis not present

## 2018-11-20 DIAGNOSIS — L570 Actinic keratosis: Secondary | ICD-10-CM | POA: Diagnosis not present

## 2018-11-20 DIAGNOSIS — L578 Other skin changes due to chronic exposure to nonionizing radiation: Secondary | ICD-10-CM | POA: Diagnosis not present

## 2018-11-21 ENCOUNTER — Ambulatory Visit (HOSPITAL_COMMUNITY)
Admission: RE | Admit: 2018-11-21 | Discharge: 2018-11-21 | Disposition: A | Discharge status: Home / Self Care | Payer: BLUE CROSS/BLUE SHIELD | Source: Ambulatory Visit | Attending: Cardiovascular Disease | Admitting: Cardiovascular Disease

## 2018-11-21 DIAGNOSIS — I482 Chronic atrial fibrillation, unspecified: Secondary | ICD-10-CM | POA: Diagnosis not present

## 2018-11-21 DIAGNOSIS — I447 Left bundle-branch block, unspecified: Secondary | ICD-10-CM | POA: Insufficient documentation

## 2018-11-21 MED ORDER — GADOBUTROL 1 MMOL/ML IV SOLN
11.0000 mL | Freq: Once | INTRAVENOUS | Status: AC | PRN
  Administered 2018-11-21: 11 mL via INTRAVENOUS

## 2018-11-24 ENCOUNTER — Telehealth: Payer: Self-pay | Admitting: Cardiovascular Disease

## 2018-11-24 NOTE — Telephone Encounter (Signed)
Spoke with patient and advised him that results are not available yet but that I will call him back as soon as the report is complete. He verbalized understanding and thanked me for the call.

## 2018-11-24 NOTE — Telephone Encounter (Signed)
New Message:    Patient calling concerning results from a MRI he last week. Please call Patient.

## 2018-11-26 MED ORDER — LOSARTAN POTASSIUM 25 MG PO TABS: 25.0000 mg | ORAL_TABLET | Freq: Every day | ORAL | 3 refills | Status: DC

## 2018-11-26 NOTE — Telephone Encounter (Signed)
Notes recorded by Nahser, Deloris Ping, MD on 11/26/2018 at 2:12 PM EST I have reviewed the MRI with Dr. Gala Romney. There is not evidence of any infiltrative process.  His reduced LV function is likely due to LBBB He will likely need a BI-V pacer at some point  Lets start Losartan 25 mg a day  Check BMP in 3 weeks  Reviewed Dr. Harvie Bridge advice with patient who verbalized agreement to start Losartan 25 mg. Dr. Elease Hashimoto advised he would like to see him in the next 1-2 months so patient is scheduled on January 15 for office visit and BMET. I advised patient to monitor BP and HR and to call back prior to ov with questions or concerns. He verbalized understanding and agreement and thanked me for the call.

## 2018-11-26 NOTE — Telephone Encounter (Signed)
Notes recorded by Nahser, Deloris Ping, MD on 11/25/2018 at 5:11 PM EST No evidence of infiltrative process.  I will discuss with Advanced Hear Failure team to see if there is anything left to do   MRI results reviewed with patient who verbalized understanding. I answered questions to his satisfaction and he is aware that I will call back with recommendations from HF team. He thanked me for the call.

## 2018-11-28 ENCOUNTER — Encounter: Payer: Self-pay | Admitting: Cardiovascular Disease

## 2018-12-24 ENCOUNTER — Encounter: Payer: Self-pay | Admitting: Cardiovascular Disease

## 2018-12-24 ENCOUNTER — Ambulatory Visit: Payer: BLUE CROSS/BLUE SHIELD | Admitting: Cardiovascular Disease

## 2018-12-24 VITALS — BP 114/80 | HR 75 | Ht 74.0 in | Wt 226.8 lb

## 2018-12-24 DIAGNOSIS — I447 Left bundle-branch block, unspecified: Secondary | ICD-10-CM

## 2018-12-24 DIAGNOSIS — I482 Chronic atrial fibrillation, unspecified: Secondary | ICD-10-CM | POA: Diagnosis not present

## 2018-12-24 LAB — BASIC METABOLIC PANEL
BUN/Creatinine Ratio: 12 (ref 9–20)
BUN: 12 mg/dL (ref 6–24)
CO2: 23 mmol/L (ref 20–29)
CREATININE: 1 mg/dL (ref 0.76–1.27)
Calcium: 9.9 mg/dL (ref 8.7–10.2)
Chloride: 100 mmol/L (ref 96–106)
GFR calc Af Amer: 99 mL/min/{1.73_m2} (ref >59)
GFR, EST NON AFRICAN AMERICAN: 86 mL/min/{1.73_m2} (ref >59)
Glucose: 85 mg/dL (ref 65–99)
Potassium: 4.6 mmol/L (ref 3.5–5.2)
Sodium: 141 mmol/L (ref 134–144)

## 2018-12-24 NOTE — Progress Notes (Signed)
Dennis Cortez Date of Birth  March 22, 1966       [facility]    [place] [address]  [address] [place], [place]  [address]   [place], [place]  [address] [phone]     [phone]   Fax  [phone]    Fax [phone]  Problem List: 1. Atrial Fibrillation - TIA  2. Hyperlipidemia    Dennis Cortez is doing well.  Asymptomatic.  Dennis Cortez last echo was 3 years ago.  He is working out at [employer] on a regular basis.  He has never had any symptoms related to Dennis Cortez A-Fib.  Dennis Cortez CHADS score is 0  March 29, 2014:  Dennis Cortez is doing well.  Not working out as much as he would like . Doing well from a cardiac standpoint.  No CP or dyspnea.     July 30, 2014: Dennis Cortez is seen today for followup of Dennis Cortez atrial fibrillation. He's been maintained on aspirin because of Dennis Cortez low chads Vasc score.  He presented several months after I saw him with some visual disturbances and headache  and was found stroke. He was started on Xarelto at that time.   He feels ok but is still having some headaches.    Sept. 19, 2016:    Doing well.  Has been walking.     No CP or dyspnea.    Oct. 4, 2017:  Dennis Cortez is seen today for follow-up of Dennis Cortez atrial fibrillation and LBBB. Tough year. Dennis Cortez mother died this past year. managment changes at work   Nov. 21, 2018: Dennis Cortez is seen today for a follow-up of Dennis Cortez chronic atrial fibrillation and left bundle branch block. Echocardiogram in March, 2014 showed normal left ventricular systolic function. Has had Afib for 20+ years.   We have not tried cardioversion.   We discussed the fact that cardioversion would likely not be successful Work has been stressful.   Nov. 22, 2019:  Dennis Cortez is seen today for follow-up of Dennis Cortez atrial fibrillation and left bundle branch block. Staying active .   Working out some .   Chol and LDL are elevated.    LDL was 122 on Crestor 10 mg a day   December 24, 2018: Is seen back today for a follow-up of Dennis Cortez  chronic atrial fibrillation left bundle branch block.  Because of Dennis Cortez chronic atrial fibrillation and conduction abnormality, we performed a cardiac MRI following Dennis Cortez last office visit.  Dennis Cortez left ventricular systolic function is mildly depressed with an ejection fraction of 49%.  No evidence of scarring.  There is no suggestion of sarcoidosis.  Current Outpatient Medications on File Prior to Visit  Medication Sig Dispense Refill  . allopurinol (ZYLOPRIM) 100 MG tablet Take 1 tablet (100 mg total) by mouth daily. 90 tablet 1  . colchicine 0.6 MG tablet TAKE 2 TABS AT FIRST FIGN OF GOUT, MAY REPEAT 1 RAB IN AN HOUR. TAKE DAILY AFTER FOR UP TO 10 DAYS 30 tablet 1  . fish oil-omega-3 fatty acids 1000 MG capsule Take 2 g by mouth daily.      Marland Kitchen losartan (COZAAR) 25 MG tablet Take 1 tablet (25 mg total) by mouth daily. 90 tablet 3  . Multiple Vitamin (MULTIVITAMIN) tablet Take 1 tablet by mouth daily.      . rosuvastatin (CRESTOR) 20 MG tablet Take 1 tablet (20 mg total) by mouth daily. 90 tablet 1  . venlafaxine XR (EFFEXOR-XR) 75 MG 24 hr capsule  TAKE ONE CAPSULE EVERY DAY 90 capsule 1  . XARELTO 20 MG TABS tablet TAKE 1 TABLET BY MOUTH EVERY DAY WITH SUPPER 90 tablet 3   No current facility-administered medications on file prior to visit.     No Known Allergies  Past Medical History:  Diagnosis Date  . Atrial fibrillation (HCC)   . Cardioembolic stroke (HCC) 05/19/2015  . Chronic anticoagulation 09/15/2015  . Esophageal reflux 09/15/2015  . Generalized anxiety disorder 05/19/2015  . H/O exercise stress test 02/2013   a. ETT-Echo with normal images but significant ST depression and hypotensive response to exercise  . Hx of cardiac catheterization    LHC 03/09/13:  Smooth and normal coronary arteries, normal LVF  . Hyperlipemia 10/22/2007   Qualifier: Diagnosis of  By: Alphonzo Severance MD, Loni Dolly   . Hyperlipidemia   . LVH (left ventricular hypertrophy)    a. Echo 02/2013:  Mild LVH, EF 55-65%,  normal wall motion, mild LAE, mild RVE, mild RAE   . Migraine without aura and without status migrainosus, not intractable 05/19/2015  . MVP (mitral valve prolapse)     Past Surgical History:  Procedure Laterality Date  . KNEE SURGERY    . US ECHOCARDIOGRAPHY  09/01/2008   EF 55-60%  . US ECHOCARDIOGRAPHY  08/17/2005   EF 55-60%    Social History   Tobacco Use  Smoking Status Never Smoker  Smokeless Tobacco Never Used    Social History   Substance and Sexual Activity  Alcohol Use Yes  . Alcohol/week: 0.0 standard drinks   Comment: 2 beers per day    Family History  Adopted: Yes    Reviw of Systems:  Reviewed in the HPI.  All other systems are negative.   Physical Exam: Blood pressure 114/80, pulse 75, height 6\' 2"  (1.88 m), weight 226 lb 12.8 oz (102.9 kg), SpO2 98 %.  GEN:  Well nourished, well developed in no acute distress HEENT: Normal NECK: No JVD; No carotid bruits LYMPHATICS: No lymphadenopathy CARDIAC: Irreg. Irreg. , no murmurs, rubs, gallops RESPIRATORY:  Clear to auscultation without rales, wheezing or rhonchi  ABDOMEN: Soft, non-tender, non-distended MUSCULOSKELETAL:  No edema; No deformity  SKIN: Warm and dry NEUROLOGIC:  Alert and oriented x 3    ECG:   Assessment / Plan:   1. Atrial Fibrillation  -basically asymptomatic.  Continue Xarelto.  Dennis Cortez heart rate is well controlled.  2. Hyperlipidemia - .  He will follow-up with Dennis Cortez primary medical doctor.  3. CVA:    .  Continue Xarelto  4.  Mildly reduced left ventricular systolic function.  Ejection fraction is 49% by MRI.  He remains asymptomatic.  We have started him on losartan 25 mg a day.  He seems to be tolerating it quite well. Continue current medications.    Kristeen Miss, MD  12/24/2018 10:33 AM    [facility] [address] [place],  [address] [place], [place]  [address] Pager [phone] Phone: [phone]; Fax: [phone]

## 2018-12-24 NOTE — Patient Instructions (Signed)
Medication Instructions:  Your physician recommends that you continue on your current medications as directed. Please refer to the Current Medication list given to you today.  If you need a refill on your cardiac medications before your next appointment, please call your pharmacy.   Lab work: TODAY - basic metabolic panel  If you have labs (blood work) drawn today and your tests are completely normal, you will receive your results only by: . MyChart Message (if you have MyChart) OR . A paper copy in the mail If you have any lab test that is abnormal or we need to change your treatment, we will call you to review the results.   Testing/Procedures: None Ordered   Follow-Up: At [facility], you and your health needs are our priority.  As part of our continuing mission to provide you with exceptional heart care, we have created designated Provider Care Teams.  These Care Teams include your primary Cardiologist (physician) and Advanced Practice Providers (APPs -  Physician Assistants and Nurse Practitioners) who all work together to provide you with the care you need, when you need it. You will need a follow up appointment in:  1 years.  Please call our office 2 months in advance to schedule this appointment.  You may see Philip Nahser, MD or one of the following Advanced Practice Providers on your designated Care Team: Gorman Weaver, PA-C Vin Bhagat, PA-C . Janine Hammond, NP    

## 2018-12-25 ENCOUNTER — Encounter: Payer: Self-pay | Admitting: Cardiovascular Disease

## 2018-12-28 NOTE — Progress Notes (Signed)
HPI:  Using dictation device. Unfortunately this device frequently misinterprets words/phrases.  Dennis Cortez is a pleasant 53 y.o. here for follow up. Chronic medical problems summarized below were reviewed for changes and stability and were updated as needed below. These issues and their treatment remain stable for the most part.  Increased his statin a little on his last lab check and also started allopurinol for gout and elevated uric acid. Uric acid normalized.reports doing well now. He had MRI with his cardiologist and reports EF down a little so now on losartan. BP a little low, but feels fine. Reports one flare of gout several months ago secondary to overeating seafood, meat and drinking beer. Fine otherwise if sticks to healthier diet and his medications. Reports did labs with his cardiologist recently. Denies CP, SOB, DOE, treatment intolerance or new symptoms.  due for lipid recheck. Sees dermatologist for skin exam twice yearly due to FH melanoma.  A. Fib, LBBB, hx cardioembolic stroke, HLD: -sees cardiologist, Dr. Melburn Popper -meds:fish oil, crestor, xarelto  Migraines: -sees neurologist, Dr. Everlena Cooper  GERD: -meds: omeprazole  Gout: -meds: allopurinol, colchicine prn  Anxiety: -meds: venlafaxine  ROS: See pertinent positives and negatives per HPI.  Past Medical History:  Diagnosis Date  . Atrial fibrillation (HCC)   . Cardioembolic stroke (HCC) 05/19/2015  . Chronic anticoagulation 09/15/2015  . Esophageal reflux 09/15/2015  . Generalized anxiety disorder 05/19/2015  . H/O exercise stress test 02/2013   a. ETT-Echo with normal images but significant ST depression and hypotensive response to exercise  . Hx of cardiac catheterization    LHC 03/09/13:  Smooth and normal coronary arteries, normal LVF  . Hyperlipemia 10/22/2007   Qualifier: Diagnosis of  By: Alphonzo Severance MD, Loni Dolly   . Hyperlipidemia   . LVH (left ventricular hypertrophy)    a. Echo 02/2013:  Mild LVH, EF  55-65%, normal wall motion, mild LAE, mild RVE, mild RAE   . Migraine without aura and without status migrainosus, not intractable 05/19/2015  . MVP (mitral valve prolapse)     Past Surgical History:  Procedure Laterality Date  . KNEE SURGERY    . US ECHOCARDIOGRAPHY  09/01/2008   EF 55-60%  . US ECHOCARDIOGRAPHY  08/17/2005   EF 55-60%    Family History  Adopted: Yes    SOCIAL HX: see hpi   Current Outpatient Medications:  .  allopurinol (ZYLOPRIM) 100 MG tablet, Take 1 tablet (100 mg total) by mouth daily., Disp: 90 tablet, Rfl: 1 .  colchicine 0.6 MG tablet, TAKE 2 TABS AT FIRST FIGN OF GOUT, MAY REPEAT 1 RAB IN AN HOUR. TAKE DAILY AFTER FOR UP TO 10 DAYS, Disp: 30 tablet, Rfl: 1 .  fish oil-omega-3 fatty acids 1000 MG capsule, Take 2 g by mouth daily.  , Disp: , Rfl:  .  losartan (COZAAR) 25 MG tablet, Take 1 tablet (25 mg total) by mouth daily., Disp: 90 tablet, Rfl: 3 .  Multiple Vitamin (MULTIVITAMIN) tablet, Take 1 tablet by mouth daily.  , Disp: , Rfl:  .  rosuvastatin (CRESTOR) 20 MG tablet, Take 1 tablet (20 mg total) by mouth daily., Disp: 90 tablet, Rfl: 1 .  venlafaxine XR (EFFEXOR-XR) 75 MG 24 hr capsule, TAKE ONE CAPSULE EVERY DAY, Disp: 90 capsule, Rfl: 1 .  XARELTO 20 MG TABS tablet, TAKE 1 TABLET BY MOUTH EVERY DAY WITH SUPPER, Disp: 90 tablet, Rfl: 3  EXAM:  Vitals:   12/30/18 0846  BP: 100/60  Pulse: 68  Temp: 98.2  F (36.8 C)    Body mass index is 29.17 kg/m.  GENERAL: vitals reviewed and listed above, alert, oriented, appears well hydrated and in no acute distress  HEENT: atraumatic, conjunttiva clear, no obvious abnormalities on inspection of external nose and ears  NECK: no obvious masses on inspection  LUNGS: clear to auscultation bilaterally, no wheezes, rales or rhonchi, good air movement  CV: HRRR, no peripheral edema  MS: moves all extremities without noticeable abnormality  PSYCH: pleasant and cooperative, no obvious depression or  anxiety  ASSESSMENT AND PLAN:  Discussed the following assessment and plan:  Hyperlipidemia, unspecified hyperlipidemia type  Generalized anxiety disorder  Atrial fibrillation, unspecified type (HCC)  Chronic anticoagulation  Cardiomyopathy, unspecified type (HCC)  -check lipids -lifestyle recs - he is adding in exercise and trying to stick to healthy diet -BP on low end but feels fine - he plans to monitor -cont current treatment -follow up 3-4 months, sooner as needed  Patient Instructions  BEFORE YOU LEAVE: -lab for cholesterol check -follow up: 3-4 months  We have ordered labs or studies at this visit. It can take up to 1-2 weeks for results and processing. IF results require follow up or explanation, we will call you with instructions. Clinically stable results will be released to your [facility]. If you have not heard from [place] or cannot find your results in [facility] in 2 weeks please contact our office at [phone].  If you are not yet signed up for [facility], please consider signing up.   We recommend the following healthy lifestyle for LIFE: 1) Small portions. But, make sure to get regular (at least 3 per day), healthy meals and small healthy snacks if needed.  2) Eat a healthy clean diet.   TRY TO EAT: -at least 5-7 servings of low sugar, colorful, and nutrient rich vegetables per day (not corn, potatoes or bananas.) -berries are the best choice if you wish to eat fruit (only eat small amounts if trying to reduce weight)  -small amounts of lean meets (fish, white meat of chicken or [place]) -vegan proteins for some meals - beans or tofu, whole grains, nuts and seeds -Replace bad fats with good fats - good fats include: fish, nuts and seeds, canola oil, olive oil -small amounts of low fat or non fat dairy -small amounts of100 % whole grains - check the lables -drink plenty of water  AVOID: -SUGAR, sweets, anything with added sugar, corn syrup or sweeteners - must  read labels as even foods advertised as "healthy" often are loaded with sugar -if you must have a sweetener, small amounts of stevia may be best -sweetened beverages and artificially sweetened beverages -simple starches (rice, bread, potatoes, pasta, chips, etc - small amounts of 100% whole grains are ok) -red meat, pork, butter -fried foods, fast food, processed food, excessive dairy, eggs and coconut.  3)Get at least 150 minutes of sweaty aerobic exercise per week.  4)Reduce stress - consider counseling, meditation and relaxation to balance other aspects of your life.          Terressa Koyanagi, DO

## 2018-12-30 ENCOUNTER — Ambulatory Visit: Payer: BLUE CROSS/BLUE SHIELD | Admitting: Family Medicine

## 2018-12-30 ENCOUNTER — Encounter: Payer: Self-pay | Admitting: Family Medicine

## 2018-12-30 VITALS — BP 100/60 | HR 68 | Temp 98.2°F | Ht 74.0 in | Wt 227.2 lb

## 2018-12-30 DIAGNOSIS — Z7901 Long term (current) use of anticoagulants: Secondary | ICD-10-CM

## 2018-12-30 DIAGNOSIS — F411 Generalized anxiety disorder: Secondary | ICD-10-CM

## 2018-12-30 DIAGNOSIS — E785 Hyperlipidemia, unspecified: Secondary | ICD-10-CM

## 2018-12-30 DIAGNOSIS — I4891 Unspecified atrial fibrillation: Secondary | ICD-10-CM

## 2018-12-30 DIAGNOSIS — I429 Cardiomyopathy, unspecified: Secondary | ICD-10-CM

## 2018-12-30 LAB — LIPID PANEL
Cholesterol: 178 mg/dL (ref 0–200)
HDL: 71.1 mg/dL (ref >39.00)
LDL Cholesterol: 94 mg/dL (ref 0–99)
NonHDL: 107.13
Total CHOL/HDL Ratio: 3
Triglycerides: 66 mg/dL (ref 0.0–149.0)
VLDL: 13.2 mg/dL (ref 0.0–40.0)

## 2018-12-30 NOTE — Patient Instructions (Signed)
BEFORE YOU LEAVE: -lab for cholesterol check -follow up: 3-4 months  We have ordered labs or studies at this visit. It can take up to 1-2 weeks for results and processing. IF results require follow up or explanation, we will call you with instructions. Clinically stable results will be released to your [facility]. If you have not heard from [place] or cannot find your results in [facility] in 2 weeks please contact our office at [phone].  If you are not yet signed up for [facility], please consider signing up.   We recommend the following healthy lifestyle for LIFE: 1) Small portions. But, make sure to get regular (at least 3 per day), healthy meals and small healthy snacks if needed.  2) Eat a healthy clean diet.   TRY TO EAT: -at least 5-7 servings of low sugar, colorful, and nutrient rich vegetables per day (not corn, potatoes or bananas.) -berries are the best choice if you wish to eat fruit (only eat small amounts if trying to reduce weight)  -small amounts of lean meets (fish, white meat of chicken or [place]) -vegan proteins for some meals - beans or tofu, whole grains, nuts and seeds -Replace bad fats with good fats - good fats include: fish, nuts and seeds, canola oil, olive oil -small amounts of low fat or non fat dairy -small amounts of100 % whole grains - check the lables -drink plenty of water  AVOID: -SUGAR, sweets, anything with added sugar, corn syrup or sweeteners - must read labels as even foods advertised as "healthy" often are loaded with sugar -if you must have a sweetener, small amounts of stevia may be best -sweetened beverages and artificially sweetened beverages -simple starches (rice, bread, potatoes, pasta, chips, etc - small amounts of 100% whole grains are ok) -red meat, pork, butter -fried foods, fast food, processed food, excessive dairy, eggs and coconut.  3)Get at least 150 minutes of sweaty aerobic exercise per week.  4)Reduce stress - consider counseling,  meditation and relaxation to balance other aspects of your life.

## 2019-02-25 ENCOUNTER — Telehealth: Payer: Self-pay | Admitting: Cardiovascular Disease

## 2019-02-25 DIAGNOSIS — I482 Chronic atrial fibrillation, unspecified: Secondary | ICD-10-CM

## 2019-02-25 DIAGNOSIS — I10 Essential (primary) hypertension: Secondary | ICD-10-CM

## 2019-02-25 DIAGNOSIS — I447 Left bundle-branch block, unspecified: Secondary | ICD-10-CM

## 2019-02-25 MED ORDER — LISINOPRIL 10 MG PO TABS: 10.0000 mg | ORAL_TABLET | Freq: Every day | ORAL | 11 refills | Status: DC

## 2019-02-25 NOTE — Telephone Encounter (Signed)
Lets try Lisinopril 10 mg a day . Check BMP in 3 weeks

## 2019-02-25 NOTE — Telephone Encounter (Signed)
F/U Messagse          Patient is calling today to resend Lorsartin to [facility] [phone]) because [facility] [address] [facility] not [facility] iit's on back order.

## 2019-02-25 NOTE — Telephone Encounter (Signed)
Advised patient of change from losartan 25 mg to lisinopril 10 mg due to shortage of losartan. Patient has an appointment on 4/21 with Dr. Selena Batten, PCP. I asked him to get bmet at that time and that I will forward a note to Dr. Selena Batten with this request. I advised patient to call back if he has any side effects from lisinopril or other concerns. He verbalized understanding and agreement with plan and thanked me for the call.

## 2019-02-25 NOTE — Telephone Encounter (Signed)
Routing to Dr. Elease Hashimoto for change in medication. [facility] is on back order at [facility]

## 2019-03-17 ENCOUNTER — Other Ambulatory Visit: Payer: Self-pay | Admitting: Neurology

## 2019-03-17 ENCOUNTER — Other Ambulatory Visit: Payer: Self-pay | Admitting: Family Medicine

## 2019-03-31 ENCOUNTER — Other Ambulatory Visit: Payer: Self-pay

## 2019-03-31 ENCOUNTER — Ambulatory Visit: Payer: BLUE CROSS/BLUE SHIELD | Admitting: Family Medicine

## 2019-03-31 ENCOUNTER — Ambulatory Visit (INDEPENDENT_AMBULATORY_CARE_PROVIDER_SITE_OTHER): Payer: BLUE CROSS/BLUE SHIELD | Admitting: Family Medicine

## 2019-03-31 ENCOUNTER — Encounter: Payer: Self-pay | Admitting: Family Medicine

## 2019-03-31 DIAGNOSIS — I4891 Unspecified atrial fibrillation: Secondary | ICD-10-CM | POA: Diagnosis not present

## 2019-03-31 DIAGNOSIS — E785 Hyperlipidemia, unspecified: Secondary | ICD-10-CM

## 2019-03-31 DIAGNOSIS — Z8739 Personal history of other diseases of the musculoskeletal system and connective tissue: Secondary | ICD-10-CM

## 2019-03-31 MED ORDER — COLCHICINE 0.6 MG PO TABS: ORAL_TABLET | ORAL | 1 refills | Status: AC

## 2019-03-31 NOTE — Progress Notes (Signed)
Virtual Visit via Video Note  I connected with Dennis Cortez on 03/31/19 at  1:30 PM EDT by a video enabled telemedicine application and verified that I am speaking with the correct person using two identifiers.  Location patient: home Location provider:home office Persons participating in the virtual visit: patient, provider  I discussed the limitations of evaluation and management by telemedicine and the availability of in person appointments. The patient expressed understanding and agreed to proceed.   HPI: Pt following up on chronic conditions and TOC, previously seen by Dr. Selena Batten.  afib- since 89 or 89.  Had a TIA a few yrs ago.  Has a Development worker, international aid.  Taking Xarelto 20 mg daily.  Pt walks for exercise (2-3 miles more on wknds).  Drinking 6-8 glasses of water per day.  May have redmeat 3 x /wk.  States is on lisinopril 10 mg more for "protection" than for issues with bp.  HLD:  Taking Crestor 20 mg   Has noticed slight muscle discomfort since increasing from 10 mg, but nothing that bothers him. In the past Zocor caused issues.  Gout:  Taking allopurinol and colchicine prn.  Hasn't had a flare in a while.  During last flare noted having sausage, fried chicken, and some other foods while out. Does drink beer several x per wk.  Would like a refill on colchicine just in case.   ROS: See pertinent positives and negatives per HPI.  Past Medical History:  Diagnosis Date  . Atrial fibrillation (HCC)   . Cardioembolic stroke (HCC) 05/19/2015  . Chronic anticoagulation 09/15/2015  . Esophageal reflux 09/15/2015  . Generalized anxiety disorder 05/19/2015  . H/O exercise stress test 02/2013   a. ETT-Echo with normal images but significant ST depression and hypotensive response to exercise  . Hx of cardiac catheterization    LHC 03/09/13:  Smooth and normal coronary arteries, normal LVF  . Hyperlipemia 10/22/2007   Qualifier: Diagnosis of  By: Alphonzo Severance MD, Loni Dolly   . Hyperlipidemia   . LVH  (left ventricular hypertrophy)    a. Echo 02/2013:  Mild LVH, EF 55-65%, normal wall motion, mild LAE, mild RVE, mild RAE   . Migraine without aura and without status migrainosus, not intractable 05/19/2015  . MVP (mitral valve prolapse)     Past Surgical History:  Procedure Laterality Date  . KNEE SURGERY    . US ECHOCARDIOGRAPHY  09/01/2008   EF 55-60%  . 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  Pt endorses social EtOH use.  Pt denies tobacco and drug use.   Current Outpatient Medications:  .  allopurinol (ZYLOPRIM) 100 MG tablet, TAKE 1 TABLET BY MOUTH EVERY DAY, Disp: 90 tablet, Rfl: 1 .  colchicine 0.6 MG tablet, TAKE 2 TABS AT FIRST FIGN OF GOUT, MAY REPEAT 1 RAB IN AN HOUR. TAKE DAILY AFTER FOR UP TO 10 DAYS, Disp: 30 tablet, Rfl: 1 .  fish oil-omega-3 fatty acids 1000 MG capsule, Take 2 g by mouth daily.  , Disp: , Rfl:  .  lisinopril (PRINIVIL,ZESTRIL) 10 MG tablet, Take 1 tablet (10 mg total) by mouth daily., Disp: 30 tablet, Rfl: 11 .  Multiple Vitamin (MULTIVITAMIN) tablet, Take 1 tablet by mouth daily.  , Disp: , Rfl:  .  rosuvastatin (CRESTOR) 20 MG tablet, TAKE 1 TABLET BY MOUTH  EVERY DAY, Disp: 90 tablet, Rfl: 1 .  venlafaxine XR (EFFEXOR-XR) 75 MG 24 hr capsule, TAKE ONE CAPSULE EVERY DAY, Disp: 90 capsule, Rfl: 1 .  XARELTO 20 MG TABS tablet, TAKE 1 TABLET BY MOUTH EVERY DAY WITH SUPPER, Disp: 90 tablet, Rfl: 3  EXAM:  VITALS per patient if applicable: RR between 12-20 bpm  GENERAL: alert, oriented, appears well and in no acute distress  HEENT: atraumatic, conjunctiva clear, no obvious abnormalities on inspection of external nose and ears  NECK: normal movements of the head and neck  LUNGS: on inspection no signs of respiratory distress, breathing rate appears normal, no obvious gross SOB, gasping or wheezing  CV: no obvious  cyanosis  MS: moves all visible extremities without noticeable abnormality  PSYCH/NEURO: pleasant and cooperative, no obvious depression or anxiety, speech and thought processing grossly intact  ASSESSMENT AND PLAN:  Discussed the following assessment and plan:  Hyperlipidemia, unspecified hyperlipidemia type -last lipid panel 12/30/18   Total Cholesterol 178, HDL 71, LDL 94, triglycerides 66 -discussed lifestyle modifications especially given h/o TIA -continue Crestor 20 mg daily -continue f/u with Cardiology  Atrial fibrillation, unspecified type (HCC) -stable -continue xarelto 20 mg daily -continue f/u with Cardiology  History of gout  -discussed avoiding foods known to cause symptoms -continue allopurinol daily -increase po intake of water and fluids. - Plan: colchicine 0.6 MG tablet  F/u prn in the next few months   I discussed the assessment and treatment plan with the patient. The patient was provided an opportunity to ask questions and all were answered. The patient agreed with the plan and demonstrated an understanding of the instructions.   The patient was advised to call back or seek an in-person evaluation if the symptoms worsen or if the condition fails to improve as anticipated.  I provided 15 minutes of non-face-to-face time during this encounter.   Deeann Saint, MD

## 2019-06-01 DIAGNOSIS — L578 Other skin changes due to chronic exposure to nonionizing radiation: Secondary | ICD-10-CM | POA: Diagnosis not present

## 2019-06-01 DIAGNOSIS — L821 Other seborrheic keratosis: Secondary | ICD-10-CM | POA: Diagnosis not present

## 2019-06-01 DIAGNOSIS — L570 Actinic keratosis: Secondary | ICD-10-CM | POA: Diagnosis not present

## 2019-08-19 ENCOUNTER — Telehealth: Payer: Self-pay | Admitting: Cardiovascular Disease

## 2019-08-19 NOTE — Telephone Encounter (Signed)
No message needed °

## 2019-08-28 NOTE — Progress Notes (Signed)
Virtual Visit via Video Note The purpose of this virtual visit is to provide medical care while limiting exposure to the novel coronavirus.    Consent was obtained for video visit:  Yes.   Answered questions that patient had about telehealth interaction:  Yes.   I discussed the limitations, risks, security and privacy concerns of performing an evaluation and management service by telemedicine. I also discussed with the patient that there may be a patient responsible charge related to this service. The patient expressed understanding and agreed to proceed.  Pt location: Home Physician Location: office Name of referring provider:  Terressa KoyanagiKim, Hannah R, DO I connected with Dennis Cortez at patients initiation/request on 08/31/2019 at  8:50 AM EDT by video enabled telemedicine application and verified that I am speaking with the correct person using two identifiers. Pt MRN:  [id number] Pt DOB:  04/06/1966 Video Participants:  Dennis BongoScott Cortez   History of Present Illness:  Dennis Cortez is a 53 year old left-handed male with atrial fibrillation (on Xarelto), MVP, hyperlipidemia, migraine and history of right occipital stroke and alcohol use who follows up for migraine.  UPDATE: No headaches over past year. Intensity:  0 Duration:  0 Frequency:  0 Frequency of abortive medication:  none Current NSAIDS:  no Current analgesics:  no Current triptans:  no Current ergotamine:  no Current anti-emetic: no Current muscle relaxants:  no Current anti-anxiolytic:  no Current sleep aide:  no Current Antihypertensive medications:  lisinopril 10mg  Current Antidepressant medications:  Venlafaxine XR 75mg  daily  Current Anticonvulsant medications:  no Current anti-CGRP:  no Current Vitamins/Herbal/Supplements:  fish oil Current Antihistamines/Decongestants:  no Other therapy:  no Other medication:  Xarelto, Crestor 20mg , allopurinol, colchicine  Depression:  no; Anxiety:  no  HISTORY: On  05/24/14, he had sudden onset of vision loss while driving.He describes it as left homonymous hemianopsia with slanting up to the right.The visual disturbance was monocular, involving each eye individually.It lasted about 45 minutes and resolved.Afterwards, he developed a right retro-orbital aching pain, lasting about a day.There was no associated double vision, unilateral weakness, unilateral numbness, dizziness, or slurred speech.MRI of the brain with and without contrast revealed a small acute right occipital infarct.Carotid dopplers revealed no hemodynamically significant stenosis.He was started on Xarelto.Visual field cut resolved the next day.  He reports an episode of probable ocular migraine that occurred several years ago.At that time, he developed hemi-vision loss associated with wavy lines in only his left eye, and lasting 10 to 15 minutes.It was not associated with headache.He also has history of migraines, presenting as left retro-orbital headaches, 4/10, and sometimes associated with nausea and photophobia but not visual disturbance, vomiting, phonophobia or unilateral numbness or weakness. They can last anywhere from minutes to several hours. Theyinitiallyoccurred3 to 4 days per week. He has not had any visual symptoms associated with them. There are no specific triggers or relieving factors. He is unable to take ASA or NSAIDs as per cardiology. He does report remote history of headaches as a child, in which he had to lay down with a rag over his eyes.These were associated with nausea.They resolved after age 53 or [age].He is adopted so family history is unknown.  Past Medical History: Past Medical History:  Diagnosis Date  . Atrial fibrillation (HCC)   . Cardioembolic stroke (HCC) 05/19/2015  . Chronic anticoagulation 09/15/2015  . Esophageal reflux 09/15/2015  . Generalized anxiety disorder 05/19/2015  . H/O exercise stress test 02/2013   a. ETT-Echo with  normal images  but significant ST depression and hypotensive response to exercise  . Hx of cardiac catheterization    LHC 03/09/13:  Smooth and normal coronary arteries, normal LVF  . Hyperlipemia 10/22/2007   Qualifier: Diagnosis of  By: Alphonzo Severance MD, Loni Dolly   . Hyperlipidemia   . LVH (left ventricular hypertrophy)    a. Echo 02/2013:  Mild LVH, EF 55-65%, normal wall motion, mild LAE, mild RVE, mild RAE   . Migraine without aura and without status migrainosus, not intractable 05/19/2015  . MVP (mitral valve prolapse)     Medications: Outpatient Encounter Medications as of 08/31/2019  Medication Sig  . allopurinol (ZYLOPRIM) 100 MG tablet TAKE 1 TABLET BY MOUTH EVERY DAY  . colchicine 0.6 MG tablet TAKE 2 TABS AT FIRST FIGN OF GOUT, MAY REPEAT 1 TAB IN AN HOUR. TAKE DAILY AFTER FOR UP TO 10 DAYS  . fish oil-omega-3 fatty acids 1000 MG capsule Take 2 g by mouth daily.    Marland Kitchen lisinopril (PRINIVIL,ZESTRIL) 10 MG tablet Take 1 tablet (10 mg total) by mouth daily.  . Multiple Vitamin (MULTIVITAMIN) tablet Take 1 tablet by mouth daily.    . rosuvastatin (CRESTOR) 20 MG tablet TAKE 1 TABLET BY MOUTH EVERY DAY  . venlafaxine XR (EFFEXOR-XR) 75 MG 24 hr capsule TAKE ONE CAPSULE EVERY DAY  . XARELTO 20 MG TABS tablet TAKE 1 TABLET BY MOUTH EVERY DAY WITH SUPPER   No facility-administered encounter medications on file as of 08/31/2019.     Allergies: No Known Allergies  Family History: Family History  Adopted: Yes    Social History: Social History   Socioeconomic History  . Marital status: Married    Spouse name: Not on file  . Number of children: 0  . Years of education: Not on file  . Highest education level: Associate degree: academic program  Occupational History  . Not on file  Social Needs  . Financial resource strain: Not on file  . Food insecurity    Worry: Not on file    Inability: Not on file  . Transportation needs    Medical: Not on file    Non-medical: Not on file   Tobacco Use  . Smoking status: Never Smoker  . Smokeless tobacco: Never Used  Substance and Sexual Activity  . Alcohol use: Yes    Alcohol/week: 0.0 standard drinks    Comment: 2 beers per day  . Drug use: No  . Sexual activity: Yes    Partners: Female  Lifestyle  . Physical activity    Days per week: Not on file    Minutes per session: Not on file  . Stress: Not on file  Relationships  . Social Musician on phone: Not on file    Gets together: Not on file    Attends religious service: Not on file    Active member of club or organization: Not on file    Attends meetings of clubs or organizations: Not on file    Relationship status: Not on file  . Intimate partner violence    Fear of current or ex partner: Not on file    Emotionally abused: Not on file    Physically abused: Not on file    Forced sexual activity: Not on file  Other Topics Concern  . Not on file  Social History Narrative  . Not on file    Observations/Objective:   Height 6\' 2"  (1.88 m), weight 230 lb (104.3 kg). No acute  distress.  Alert and oriented.  Speech fluent and not dysarthric.  Language intact.  Eyes orthophoric on primary gaze.  Face symmetric.  Assessment and Plan:   1.  Migraine without aura, without status migrainosus, not intractable 2.  History of right occipital ischemic infarct, cryptogenic.  May have been cardioembolic or migraine with persistent aura and infarct. 3.  Atrial fibrillation 4.  Anxiety, stable  1.  For preventative management, venlafaxine XR 75mg  daily for anxiety and migraine prevention. 2.  For secondary stroke prevention, Xarelto and Crestor 3.  Limit use of pain relievers to no more than 2 days out of week to prevent risk of rebound or medication-overuse headache. 4.  Keep headache diary 5.  Exercise, hydration, caffeine cessation, sleep hygiene, monitor for and avoid triggers 6.  Consider:  magnesium citrate 400mg  daily, riboflavin 400mg  daily, and  coenzyme Q10 100mg  three times daily 7. Follow up in one year  Follow Up Instructions:    -I discussed the assessment and treatment plan with the patient. The patient was provided an opportunity to ask questions and all were answered. The patient agreed with the plan and demonstrated an understanding of the instructions.   The patient was advised to call back or seek an in-person evaluation if the symptoms worsen or if the condition fails to improve as anticipated.    Dudley Major, DO

## 2019-08-31 ENCOUNTER — Encounter: Payer: Self-pay | Admitting: Neurology

## 2019-08-31 ENCOUNTER — Other Ambulatory Visit: Payer: Self-pay

## 2019-08-31 ENCOUNTER — Telehealth (INDEPENDENT_AMBULATORY_CARE_PROVIDER_SITE_OTHER): Payer: BC Managed Care – PPO | Admitting: Neurology

## 2019-08-31 VITALS — Ht 74.0 in | Wt 230.0 lb

## 2019-08-31 DIAGNOSIS — F419 Anxiety disorder, unspecified: Secondary | ICD-10-CM

## 2019-08-31 DIAGNOSIS — I63431 Cerebral infarction due to embolism of right posterior cerebral artery: Secondary | ICD-10-CM

## 2019-08-31 DIAGNOSIS — G43009 Migraine without aura, not intractable, without status migrainosus: Secondary | ICD-10-CM

## 2019-08-31 DIAGNOSIS — I4891 Unspecified atrial fibrillation: Secondary | ICD-10-CM

## 2019-08-31 MED ORDER — VENLAFAXINE HCL ER 75 MG PO CP24: 75.0000 mg | ORAL_CAPSULE | Freq: Every day | ORAL | 3 refills | Status: DC

## 2019-09-07 ENCOUNTER — Other Ambulatory Visit: Payer: Self-pay | Admitting: Family Medicine

## 2019-09-14 ENCOUNTER — Other Ambulatory Visit: Payer: Self-pay | Admitting: Cardiovascular Disease

## 2019-09-27 ENCOUNTER — Other Ambulatory Visit: Payer: Self-pay | Admitting: Cardiovascular Disease

## 2019-09-28 NOTE — Telephone Encounter (Signed)
Xarelto 20mg  refill request received; pt is 53 years old, weight-104.3kg, Crea-1.00 on 12/24/2018, last seen by Dr. Acie Fredrickson on 12/14/2018, Diagnosis-Afib, CrCl-124.14ml/min; Dose is appropriate based on dosing criteria. Will send in refill to requested pharmacy.

## 2019-10-20 DIAGNOSIS — H5213 Myopia, bilateral: Secondary | ICD-10-CM | POA: Diagnosis not present

## 2019-10-20 DIAGNOSIS — H5340 Unspecified visual field defects: Secondary | ICD-10-CM | POA: Diagnosis not present

## 2019-11-22 DIAGNOSIS — Z20828 Contact with and (suspected) exposure to other viral communicable diseases: Secondary | ICD-10-CM | POA: Diagnosis not present

## 2019-11-26 DIAGNOSIS — Z79899 Other long term (current) drug therapy: Secondary | ICD-10-CM | POA: Diagnosis not present

## 2019-11-26 DIAGNOSIS — H2513 Age-related nuclear cataract, bilateral: Secondary | ICD-10-CM | POA: Diagnosis not present

## 2019-11-26 DIAGNOSIS — H5203 Hypermetropia, bilateral: Secondary | ICD-10-CM | POA: Diagnosis not present

## 2019-12-01 DIAGNOSIS — L821 Other seborrheic keratosis: Secondary | ICD-10-CM | POA: Diagnosis not present

## 2019-12-01 DIAGNOSIS — L578 Other skin changes due to chronic exposure to nonionizing radiation: Secondary | ICD-10-CM | POA: Diagnosis not present

## 2019-12-01 DIAGNOSIS — L570 Actinic keratosis: Secondary | ICD-10-CM | POA: Diagnosis not present

## 2019-12-06 ENCOUNTER — Other Ambulatory Visit: Payer: Self-pay | Admitting: Cardiovascular Disease

## 2019-12-06 ENCOUNTER — Other Ambulatory Visit: Payer: Self-pay | Admitting: Family Medicine

## 2019-12-28 ENCOUNTER — Telehealth (INDEPENDENT_AMBULATORY_CARE_PROVIDER_SITE_OTHER): Payer: BC Managed Care – PPO | Admitting: Family Medicine

## 2019-12-28 ENCOUNTER — Other Ambulatory Visit: Payer: Self-pay | Admitting: Cardiovascular Disease

## 2019-12-28 DIAGNOSIS — J069 Acute upper respiratory infection, unspecified: Secondary | ICD-10-CM

## 2019-12-28 DIAGNOSIS — Z20828 Contact with and (suspected) exposure to other viral communicable diseases: Secondary | ICD-10-CM | POA: Diagnosis not present

## 2019-12-28 DIAGNOSIS — Z7189 Other specified counseling: Secondary | ICD-10-CM | POA: Diagnosis not present

## 2019-12-28 MED ORDER — LISINOPRIL 10 MG PO TABS: 10.0000 mg | ORAL_TABLET | Freq: Every day | ORAL | 0 refills | Status: DC

## 2019-12-28 NOTE — Telephone Encounter (Signed)
New Message   *STAT* If patient is at the pharmacy, call can be transferred to refill team.   1. Which medications need to be refilled? (please list name of each medication and dose if known) Losartan  2. Which pharmacy/location (including street and city if local pharmacy) is medication to be sent to? [id number] [place], [place] - [address]  3. Do they need a 30 day or 90 day supply? 90

## 2019-12-28 NOTE — Progress Notes (Signed)
Virtual Visit via Video Note  I connected with Dennis Cortez on 12/28/19 at 10:00 AM EST by a video enabled telemedicine application 2/2 COVID-19 pandemic and verified that I am speaking with the correct person using two identifiers.  Location patient: home Location provider:work or home office Persons participating in the virtual visit: patient, provider  I discussed the limitations of evaluation and management by telemedicine and the availability of in person appointments. The patient expressed understanding and agreed to proceed.   HPI: Pt is a pleasant 54 yo male with pmh sig for Afib on xarelto, h/o Cardioembolic stroke,esophageal reflux, MVP, HLD, and migraines who is seen for acute illness.  Pt developed HAs, chills, body aches, congestion, sore throat, and eye pain on Friday evening.  Also with an occasional deep cough.  Body aches are improving.  Pt denies fever, ear pain, n/v, diarrhea, loss of taste or smell.  Taking Tylenol.  A few people at work are positive for COVID-19, but pt has not been in close contact with them and wears his mask.  Pt's wife and dog are well.   COVID testing scheduled for today at [facility] at 11:40 pm .  Pt did not have an influenza vaccine this yr.  ROS: See pertinent positives and negatives per HPI.  Past Medical History:  Diagnosis Date  . Atrial fibrillation (HCC)   . Cardioembolic stroke (HCC) 05/19/2015  . Chronic anticoagulation 09/15/2015  . Esophageal reflux 09/15/2015  . Generalized anxiety disorder 05/19/2015  . H/O exercise stress test 02/2013   a. ETT-Echo with normal images but significant ST depression and hypotensive response to exercise  . Hx of cardiac catheterization    LHC 03/09/13:  Smooth and normal coronary arteries, normal LVF  . Hyperlipemia 10/22/2007   Qualifier: Diagnosis of  By: Alphonzo Severance MD, Loni Dolly   . Hyperlipidemia   . LVH (left ventricular hypertrophy)    a. Echo 02/2013:  Mild LVH, EF 55-65%, normal wall motion, mild LAE,  mild RVE, mild RAE   . Migraine without aura and without status migrainosus, not intractable 05/19/2015  . MVP (mitral valve prolapse)     Past Surgical History:  Procedure Laterality Date  . KNEE SURGERY    . US ECHOCARDIOGRAPHY  09/01/2008   EF 55-60%  . US ECHOCARDIOGRAPHY  08/17/2005   EF 55-60%    Family History  Adopted: Yes     Current Outpatient Medications:  .  allopurinol (ZYLOPRIM) 100 MG tablet, TAKE 1 TABLET BY MOUTH EVERY DAY, Disp: 90 tablet, Rfl: 1 .  colchicine 0.6 MG tablet, TAKE 2 TABS AT FIRST FIGN OF GOUT, MAY REPEAT 1 TAB IN AN HOUR. TAKE DAILY AFTER FOR UP TO 10 DAYS, Disp: 30 tablet, Rfl: 1 .  fish oil-omega-3 fatty acids 1000 MG capsule, Take 2 g by mouth daily.  , Disp: , Rfl:  .  lisinopril (PRINIVIL,ZESTRIL) 10 MG tablet, Take 1 tablet (10 mg total) by mouth daily., Disp: 30 tablet, Rfl: 11 .  Multiple Vitamin (MULTIVITAMIN) tablet, Take 1 tablet by mouth daily.  , Disp: , Rfl:  .  rosuvastatin (CRESTOR) 20 MG tablet, TAKE 1 TABLET BY MOUTH EVERY DAY, Disp: 90 tablet, Rfl: 1 .  venlafaxine XR (EFFEXOR-XR) 75 MG 24 hr capsule, Take 1 capsule (75 mg total) by mouth daily., Disp: 90 capsule, Rfl: 3 .  XARELTO 20 MG TABS tablet, TAKE 1 TABLET BY MOUTH EVERY DAY WITH SUPPER, Disp: 90 tablet, Rfl: 1  EXAM:  VITALS per patient  if applicable: RR between 52-77 bpm  GENERAL: alert, oriented, appears well and in no acute distress  HEENT: atraumatic, conjunctiva clear, no obvious abnormalities on inspection of external nose and ears  NECK: normal movements of the head and neck  LUNGS: on inspection no signs of respiratory distress, breathing rate appears normal, no obvious gross SOB, gasping or wheezing  CV: no obvious cyanosis  MS: moves all visible extremities without noticeable abnormality  PSYCH/NEURO: pleasant and cooperative, no obvious depression or anxiety, speech and thought processing grossly intact  ASSESSMENT AND PLAN:  Discussed the following  assessment and plan:  URI with cough and congestion -discussed possible causes including viral etiology such as acute nasopharyngitis, influenza, however cannot r/o COVID-19 infection -continue supportive care -Pt to have COVID testing today -self quarantine advised -given precautions -offered letter for work  Educated about COVID-19 virus infection -discussed s/s of COVID-19 -Will have COVID-19 testing today -self quarantine advised -frequent handwashing. Wear a mask.  -given precautions  F/u prn for continued or worsening symptoms   I discussed the assessment and treatment plan with the patient. The patient was provided an opportunity to ask questions and all were answered. The patient agreed with the plan and demonstrated an understanding of the instructions.   The patient was advised to call back or seek an in-person evaluation if the symptoms worsen or if the condition fails to improve as anticipated.  Billie Ruddy, MD

## 2019-12-28 NOTE — Telephone Encounter (Signed)
I called pt and left a message informing pt that he no longer takes Losartan and that he now takes lisinopril and that I sent in a refill to his pharmacy for a 30 day supply and that he can discuss with Dr. Elease Hashimoto at his appointment about the medication. Confirmation received.

## 2019-12-29 ENCOUNTER — Ambulatory Visit: Payer: BC Managed Care – PPO | Admitting: Cardiovascular Disease

## 2020-01-04 ENCOUNTER — Telehealth (INDEPENDENT_AMBULATORY_CARE_PROVIDER_SITE_OTHER): Payer: BC Managed Care – PPO | Admitting: Family Medicine

## 2020-01-04 DIAGNOSIS — R110 Nausea: Secondary | ICD-10-CM | POA: Diagnosis not present

## 2020-01-04 DIAGNOSIS — U071 COVID-19: Secondary | ICD-10-CM

## 2020-01-04 MED ORDER — ONDANSETRON HCL 4 MG PO TABS: 4.0000 mg | ORAL_TABLET | Freq: Three times a day (TID) | ORAL | 0 refills | Status: DC | PRN

## 2020-01-04 NOTE — Progress Notes (Signed)
Virtual Visit via Video Note  I connected with Dennis Cortez on 01/04/20 at  4:30 PM EST by a video enabled telemedicine application 2/2 COVID-19 pandemic and verified that I am speaking with the correct person using two identifiers.  Location patient: home Location provider:work or home office Persons participating in the virtual visit: patient, provider  I discussed the limitations of evaluation and management by telemedicine and the availability of in person appointments. The patient expressed understanding and agreed to proceed.   HPI: Pt is a 54 yo male with pmh sig for Afib on xarelto, h/o Cardioembolic stroke, esophageal reflux, MVP, HLD, and migraines who tested positive for COVID-19 on 1/18.  Pt had body aches, HAs, chills, congestion, sore throat, and eye pain on 1/15.  Pt developed a fever for the first time last night.  Pt also with increased nausea.  Does better drinking water than ginger ale.  Taking OTC meds such as benadryl for symptoms.  Pt states his wife has not developed symptoms.    ROS: See pertinent positives and negatives per HPI.  Past Medical History:  Diagnosis Date  . Atrial fibrillation (HCC)   . Cardioembolic stroke (HCC) 05/19/2015  . Chronic anticoagulation 09/15/2015  . Esophageal reflux 09/15/2015  . Generalized anxiety disorder 05/19/2015  . H/O exercise stress test 02/2013   a. ETT-Echo with normal images but significant ST depression and hypotensive response to exercise  . Hx of cardiac catheterization    LHC 03/09/13:  Smooth and normal coronary arteries, normal LVF  . Hyperlipemia 10/22/2007   Qualifier: Diagnosis of  By: Alphonzo Severance MD, Loni Dolly   . Hyperlipidemia   . LVH (left ventricular hypertrophy)    a. Echo 02/2013:  Mild LVH, EF 55-65%, normal wall motion, mild LAE, mild RVE, mild RAE   . Migraine without aura and without status migrainosus, not intractable 05/19/2015  . MVP (mitral valve prolapse)     Past Surgical History:  Procedure  Laterality Date  . KNEE SURGERY    . US ECHOCARDIOGRAPHY  09/01/2008   EF 55-60%  . US ECHOCARDIOGRAPHY  08/17/2005   EF 55-60%    Family History  Adopted: Yes    Current Outpatient Medications:  .  allopurinol (ZYLOPRIM) 100 MG tablet, TAKE 1 TABLET BY MOUTH EVERY DAY, Disp: 90 tablet, Rfl: 1 .  colchicine 0.6 MG tablet, TAKE 2 TABS AT FIRST FIGN OF GOUT, MAY REPEAT 1 TAB IN AN HOUR. TAKE DAILY AFTER FOR UP TO 10 DAYS, Disp: 30 tablet, Rfl: 1 .  fish oil-omega-3 fatty acids 1000 MG capsule, Take 2 g by mouth daily.  , Disp: , Rfl:  .  lisinopril (ZESTRIL) 10 MG tablet, Take 1 tablet (10 mg total) by mouth daily. Please keep upcoming appt with Dr. Elease Hashimoto in January for future refills. Thank you, Disp: 30 tablet, Rfl: 0 .  Multiple Vitamin (MULTIVITAMIN) tablet, Take 1 tablet by mouth daily.  , Disp: , Rfl:  .  rosuvastatin (CRESTOR) 20 MG tablet, TAKE 1 TABLET BY MOUTH EVERY DAY, Disp: 90 tablet, Rfl: 1 .  venlafaxine XR (EFFEXOR-XR) 75 MG 24 hr capsule, Take 1 capsule (75 mg total) by mouth daily., Disp: 90 capsule, Rfl: 3 .  XARELTO 20 MG TABS tablet, TAKE 1 TABLET BY MOUTH EVERY DAY WITH SUPPER, Disp: 90 tablet, Rfl: 1  EXAM:  VITALS per patient if applicable:  RR between 12-20 bpm  GENERAL: alert, oriented, appears well and in no acute distress  HEENT: atraumatic, conjunctiva clear,  no obvious abnormalities on inspection of external nose and ears  NECK: normal movements of the head and neck  LUNGS: on inspection no signs of respiratory distress, breathing rate appears normal, no obvious gross SOB, gasping or wheezing  CV: no obvious cyanosis  MS: moves all visible extremities without noticeable abnormality  PSYCH/NEURO: pleasant and cooperative, no obvious depression or anxiety, speech and thought processing grossly intact  ASSESSMENT AND PLAN:  Discussed the following assessment and plan:  COVID-19 virus infection -continue supportive care: Tylenol, rest, hydration, OTC  cold meds prn -continue self quarantine -questions answered about expected duration of symptoms/how long contagious. -given precautions  Nausea  -discussed taking small sips of fluids - Plan: ondansetron (ZOFRAN) 4 MG tablet  F/u prn   I discussed the assessment and treatment plan with the patient. The patient was provided an opportunity to ask questions and all were answered. The patient agreed with the plan and demonstrated an understanding of the instructions.   The patient was advised to call back or seek an in-person evaluation if the symptoms worsen or if the condition fails to improve as anticipated.   Billie Ruddy, MD

## 2020-01-06 ENCOUNTER — Telehealth: Payer: Self-pay | Admitting: Cardiovascular Disease

## 2020-01-06 NOTE — Telephone Encounter (Signed)
I spoke to the patient who called to inform us that he is still having CoVid symptoms (nausea, fatigue, cough) so he cancelled his appointment with Dr Elease Hashimoto on 1/28 and rescheduled for 2/12 with Latavion.     His symptoms are being addressed by his PCP, but wanted to inform Dr Elease Hashimoto of status.

## 2020-01-06 NOTE — Telephone Encounter (Signed)
Patient is calling wanting to inform Dr. Elease Hashimoto he is still experiencing Covid symptoms.  He states he was diagnosed on 12/26/19 and most symptoms have cleared up since, but he is still experiencing nausea. He didn't know if there is anything Dr. Elease Hashimoto suggested for it, but just wanted to inform him as to why he has had to cancel his last two appointments. Please advise.

## 2020-01-07 ENCOUNTER — Ambulatory Visit: Payer: BC Managed Care – PPO | Admitting: Cardiovascular Disease

## 2020-01-08 NOTE — Telephone Encounter (Signed)
Left message for patient that we are sorry to hear about his persisting symptoms from Covid infection and that we hope he is feeling better daily. Advised he may call back with questions or concerns, otherwise keep appointment with Dennis Cortez on 2/12.

## 2020-01-22 ENCOUNTER — Other Ambulatory Visit: Payer: Self-pay | Admitting: Cardiovascular Disease

## 2020-01-22 ENCOUNTER — Encounter: Payer: Self-pay | Admitting: Physician Assistant

## 2020-01-22 ENCOUNTER — Other Ambulatory Visit: Payer: Self-pay

## 2020-01-22 ENCOUNTER — Ambulatory Visit: Payer: BC Managed Care – PPO | Admitting: Physician Assistant

## 2020-01-22 VITALS — BP 118/70 | HR 63 | Ht 74.0 in | Wt 220.1 lb

## 2020-01-22 DIAGNOSIS — Z8673 Personal history of transient ischemic attack (TIA), and cerebral infarction without residual deficits: Secondary | ICD-10-CM | POA: Diagnosis not present

## 2020-01-22 DIAGNOSIS — I5022 Chronic systolic (congestive) heart failure: Secondary | ICD-10-CM

## 2020-01-22 DIAGNOSIS — I482 Chronic atrial fibrillation, unspecified: Secondary | ICD-10-CM | POA: Diagnosis not present

## 2020-01-22 DIAGNOSIS — Z8616 Personal history of COVID-19: Secondary | ICD-10-CM

## 2020-01-22 DIAGNOSIS — I428 Other cardiomyopathies: Secondary | ICD-10-CM | POA: Diagnosis not present

## 2020-01-22 MED ORDER — LOSARTAN POTASSIUM 25 MG PO TABS: 25.0000 mg | ORAL_TABLET | Freq: Every day | ORAL | 3 refills | Status: DC

## 2020-01-22 NOTE — Patient Instructions (Signed)
Medication Instructions:   Your physician has recommended you make the following change in your medication:   1) Stop Lisinopril 10 mg 2) Start Losartan 25 mg, 1 tablet by mouth once a day  *If you need a refill on your cardiac medications before your next appointment, please call your pharmacy*  Lab Work:  You will have labs drawn today: BMET/CBC  If you have labs (blood work) drawn today and your tests are completely normal, you will receive your results only by: Marland Kitchen MyChart Message (if you have MyChart) OR . A paper copy in the mail If you have any lab test that is abnormal or we need to change your treatment, we will call you to review the results.  Testing/Procedures:  None ordered today  Follow-Up: At [facility], you and your health needs are our priority.  As part of our continuing mission to provide you with exceptional heart care, we have created designated Provider Care Teams.  These Care Teams include your primary Cardiologist (physician) and Advanced Practice Providers (APPs -  Physician Assistants and Nurse Practitioners) who all work together to provide you with the care you need, when you need it.  Your next appointment:   12 month(s)  The format for your next appointment:   In Person  Provider:   You may see Kristeen Miss, MD or one of the following Advanced Practice Providers on your designated Care Team:    Tereso Newcomer, PA-C  [place], [place]  [place], [place]

## 2020-01-22 NOTE — Progress Notes (Addendum)
Cardiology Office Note:    Date:  01/22/2020   ID:  Karmen Bongo, DOB Aug 08, 1966, MRN [id number]  PCP:  Deeann Saint, MD  Cardiologist:  Kristeen Miss, MD   Electrophysiologist:  None   Referring MD: Deeann Saint, MD   Chief Complaint:  Follow-up (Atrial fibrillation, CHF)    Patient Profile:    Dennis Cortez is a 54 y.o. male with:   Permanent atrial fibrillation  CHA2DS2-VASc=2 (CVA) >> Rivaroxaban  Chronic systolic CHF  Non-ischemic cardiomyopathy   cMRI 12/19: EF 49, no LGE  Hx of CVA in 2015   Mitral valve prolapse  Hyperlipidemia  Cardiac cath 02/2013: Normal coronary arteries  LBBB   Hx of COVID-19 (12/2019)  Prior CV studies: Cardiac MRI 11/25/2018 Moderate basal septal hypertrophy, EF 49, mild HK, paradoxical septal motion; no LGE; mild reduced RV SF (EF 39), moderate BAE, mild MR/TR There is mild bi-ventricular systolic dysfunction, with no evidence for infiltrative or inflammatory cardiomyopathy. This can be arrhythmia induced.  Carotid US 05/2014 No evidence of stenosis  Echocardiogram 02/24/2013 Mild LVH, EF 55-65, normal wall motion, mild BAE  Cardiac catheterization 03/09/2013 Normal coronary arteries Normal LV function  History of Present Illness:    Dennis Cortez returns for follow-up.  He is here alone.  He was diagnosed with COVID-19 in early January.  He is feeling much better.  He has had a cough and thinks it may be related to lisinopril.  He was previously on losartan without difficulty.  He has not had chest discomfort or significant shortness of breath.  He has not had orthopnea or leg swelling.  He has not had syncope.  He has not had any bleeding issues.    Past Medical History:  Diagnosis Date  . Atrial fibrillation (HCC)   . Cardioembolic stroke (HCC) 05/19/2015  . Chronic anticoagulation 09/15/2015  . Esophageal reflux 09/15/2015  . Generalized anxiety disorder 05/19/2015  . H/O exercise stress test 02/2013   a. ETT-Echo  with normal images but significant ST depression and hypotensive response to exercise  . Hx of cardiac catheterization    LHC 03/09/13:  Smooth and normal coronary arteries, normal LVF  . Hyperlipemia 10/22/2007   Qualifier: Diagnosis of  By: Alphonzo Severance MD, Loni Dolly   . Hyperlipidemia   . LVH (left ventricular hypertrophy)    a. Echo 02/2013:  Mild LVH, EF 55-65%, normal wall motion, mild LAE, mild RVE, mild RAE   . Migraine without aura and without status migrainosus, not intractable 05/19/2015  . MVP (mitral valve prolapse)     Current Medications: Current Meds  Medication Sig  . allopurinol (ZYLOPRIM) 100 MG tablet TAKE 1 TABLET BY MOUTH EVERY DAY  . colchicine 0.6 MG tablet TAKE 2 TABS AT FIRST FIGN OF GOUT, MAY REPEAT 1 TAB IN AN HOUR. TAKE DAILY AFTER FOR UP TO 10 DAYS  . fish oil-omega-3 fatty acids 1000 MG capsule Take 2 g by mouth daily.    . Multiple Vitamin (MULTIVITAMIN) tablet Take 1 tablet by mouth daily.    . ondansetron (ZOFRAN) 4 MG tablet Take 1 tablet (4 mg total) by mouth every 8 (eight) hours as needed for nausea or vomiting.  . rosuvastatin (CRESTOR) 20 MG tablet TAKE 1 TABLET BY MOUTH EVERY DAY  . venlafaxine XR (EFFEXOR-XR) 75 MG 24 hr capsule Take 1 capsule (75 mg total) by mouth daily.  Carlena Hurl 20 MG TABS tablet TAKE 1 TABLET BY MOUTH EVERY DAY WITH SUPPER  . [DISCONTINUED]  lisinopril (ZESTRIL) 10 MG tablet Take 1 tablet (10 mg total) by mouth daily. Please keep upcoming appt with Dr. Acie Fredrickson in January for future refills. Thank you     Allergies:   Patient has no known allergies.   Social History   Tobacco Use  . Smoking status: Never Smoker  . Smokeless tobacco: Never Used  Substance Use Topics  . Alcohol use: Yes    Alcohol/week: 0.0 standard drinks    Comment: 2 beers per day  . Drug use: No     Family Hx: The patient's family history is not on file. He was adopted.  ROS   EKGs/Labs/Other Test Reviewed:    EKG:  EKG is  ordered today.  The  ekg ordered today demonstrates atrial fibrillation, HR 63, left bundle branch block  Recent Labs: No results found for requested labs within last 8760 hours.   Recent Lipid Panel Lab Results  Component Value Date/Time   CHOL 178 12/30/2018 09:15 AM   TRIG 66.0 12/30/2018 09:15 AM   HDL 71.10 12/30/2018 09:15 AM   CHOLHDL 3 12/30/2018 09:15 AM   LDLCALC 94 12/30/2018 09:15 AM     Physical Exam:    VS:  BP 118/70   Pulse 63   Ht 6\' 2"  (1.88 m)   Wt 220 lb 1.9 oz (99.8 kg)   SpO2 98%   BMI 28.26 kg/m     Wt Readings from Last 3 Encounters:  01/22/20 220 lb 1.9 oz (99.8 kg)  08/31/19 230 lb (104.3 kg)  12/30/18 227 lb 3.2 oz (103.1 kg)     Constitutional:      Appearance: Healthy appearance. Not in distress.  Neck:     Thyroid: Thyroid normal.     Vascular: JVD normal.  Pulmonary:     Effort: Pulmonary effort is normal.     Breath sounds: No wheezing. No rales.  Cardiovascular:     Normal rate. Irregularly irregular rhythm. Normal S1. Normal S2.     Murmurs: There is no murmur.  Edema:    Peripheral edema absent.  Abdominal:     Palpations: Abdomen is soft. There is no hepatomegaly.  Skin:    General: Skin is warm and dry.  Neurological:     General: No focal deficit present.     Mental Status: Alert and oriented to person, place and time.     Cranial Nerves: Cranial nerves are intact.      ASSESSMENT & PLAN:    1. Chronic atrial fibrillation (HCC) Heart rate well controlled.  He does not take any rate controlling medications.  He is tolerating long-term anticoagulation with Rivaroxaban.  Obtain follow-up basic metabolic panel and complete blood count today.  Follow-up 1 year.  2. Chronic systolic CHF (congestive heart failure) ([place]) 3. NICM (nonischemic cardiomyopathy) ([place]) EF 49 by cardiac MRI December 2019.  There was no evidence of scar, infiltrative or inflammatory cardiomyopathy.  Low EF is likely related to left bundle branch block.  He is NYHA I.   Volume status is stable and he is not on long-term diuretic therapy.  Heart rate will not tolerate beta-blocker therapy.  He has had a cough related to ACE inhibitor.  I will switch him back to losartan.  -DC lisinopril  -Start losartan 25 mg daily  4. History of CVA (cerebrovascular accident) No apparent residual effect.  He is now on long-term anticoagulation therapy.  5. History of COVID-19 He has recovered well and it is been more than 2  weeks since his positive test.   Dispo:  No follow-ups on file.   Medication Adjustments/Labs and Tests Ordered: Current medicines are reviewed at length with the patient today.  Concerns regarding medicines are outlined above.  Tests Ordered: Orders Placed This Encounter  Procedures  . Basic metabolic panel  . CBC   Medication Changes: Meds ordered this encounter  Medications  . losartan (COZAAR) 25 MG tablet    Sig: Take 1 tablet (25 mg total) by mouth daily.    Dispense:  90 tablet    Refill:  3    Replaces Lisinopril    Order Specific Question:   Supervising Provider    Answer:   Lewayne Bunting [[id number]]    Signed, Tereso Newcomer, PA-C  01/22/2020 12:48 PM    [facility] [address] [place], [place], [place]  [address] Phone: [phone]; Fax: [phone]

## 2020-01-23 LAB — CBC
Hematocrit: 43.6 % (ref 37.5–51.0)
Hemoglobin: 15.1 g/dL (ref 13.0–17.7)
MCH: 33 pg (ref 26.6–33.0)
MCHC: 34.6 g/dL (ref 31.5–35.7)
MCV: 95 fL (ref 79–97)
Platelets: 256 10*3/uL (ref 150–450)
RBC: 4.58 x10E6/uL (ref 4.14–5.80)
RDW: 13.1 % (ref 11.6–15.4)
WBC: 6.1 10*3/uL (ref 3.4–10.8)

## 2020-01-23 LAB — BASIC METABOLIC PANEL
BUN/Creatinine Ratio: 8 — ABNORMAL LOW (ref 9–20)
BUN: 8 mg/dL (ref 6–24)
CO2: 24 mmol/L (ref 20–29)
Calcium: 9.8 mg/dL (ref 8.7–10.2)
Chloride: 102 mmol/L (ref 96–106)
Creatinine, Ser: 0.99 mg/dL (ref 0.76–1.27)
GFR calc Af Amer: 99 mL/min/{1.73_m2} (ref >59)
GFR calc non Af Amer: 85 mL/min/{1.73_m2} (ref >59)
Glucose: 96 mg/dL (ref 65–99)
Potassium: 4.5 mmol/L (ref 3.5–5.2)
Sodium: 139 mmol/L (ref 134–144)

## 2020-01-25 NOTE — Addendum Note (Signed)
Addended by: Vernard Gambles on: 01/25/2020 12:33 PM   Modules accepted: Orders

## 2020-03-10 ENCOUNTER — Ambulatory Visit: Payer: BC Managed Care – PPO | Attending: Internal Medicine

## 2020-03-10 DIAGNOSIS — Z23 Encounter for immunization: Secondary | ICD-10-CM

## 2020-03-10 NOTE — Progress Notes (Signed)
   Covid-19 Vaccination Clinic  Name:  Srijan Givan    MRN: [id number] DOB: 12/28/1965  03/10/2020  Mr. Hauss was observed post Covid-19 immunization for 15 minutes without incident. He was provided with Vaccine Information Sheet and instruction to access the V-Safe system.   Mr. Jurney was instructed to call 911 with any severe reactions post vaccine: Marland Kitchen Difficulty breathing  . Swelling of face and throat  . A fast heartbeat  . A bad rash all over body  . Dizziness and weakness   Immunizations Administered    Name Date Dose VIS Date Route   Pfizer COVID-19 Vaccine 03/10/2020 10:10 AM 0.3 mL 11/20/2019 Intramuscular   Manufacturer: [employer], [employer]   Lot: [id number]   NDC: [address]-4

## 2020-03-20 ENCOUNTER — Other Ambulatory Visit: Payer: Self-pay | Admitting: Cardiovascular Disease

## 2020-03-21 NOTE — Telephone Encounter (Signed)
Pt last saw Tereso Newcomer, PA on 01/22/20, last labs 01/22/20 Creat 0.99, age 54, weight 99.8kg, CrCl 119.01, based on CrCl pt is on appropriate dosage of Xarelto 20mg  QD.  Will refill rx.

## 2020-04-04 ENCOUNTER — Ambulatory Visit: Payer: BC Managed Care – PPO | Attending: Internal Medicine

## 2020-04-04 DIAGNOSIS — Z23 Encounter for immunization: Secondary | ICD-10-CM

## 2020-04-04 NOTE — Progress Notes (Signed)
   Covid-19 Vaccination Clinic  Name:  Elijio Staples    MRN: [id number] DOB: [birth year]  04/04/2020  Mr. Calender was observed post Covid-19 immunization for 15 minutes without incident. He was provided with Vaccine Information Sheet and instruction to access the V-Safe system.   Mr. Gaffin was instructed to call 911 with any severe reactions post vaccine: Marland Kitchen Difficulty breathing  . Swelling of face and throat  . A fast heartbeat  . A bad rash all over body  . Dizziness and weakness   Immunizations Administered    Name Date Dose VIS Date Route   Pfizer COVID-19 Vaccine 04/04/2020  8:43 AM 0.3 mL 02/03/2019 Intramuscular   Manufacturer: [employer], [employer]   Lot: [id number]   NDC: [address]-4

## 2020-05-21 ENCOUNTER — Other Ambulatory Visit: Payer: Self-pay | Admitting: Family Medicine

## 2020-08-27 ENCOUNTER — Other Ambulatory Visit: Payer: Self-pay | Admitting: Neurology

## 2020-09-15 ENCOUNTER — Other Ambulatory Visit: Payer: Self-pay | Admitting: Cardiovascular Disease

## 2020-09-15 NOTE — Telephone Encounter (Signed)
Prescription refill request for Xarelto received.   Last office visit: Alben Spittle 01/22/2020 Weight: 99.8 kg Age: 54 yo Scr: 0.99, 01/22/2020 CrCl: 119 ml/min   Prescription refill sent.

## 2020-09-24 ENCOUNTER — Other Ambulatory Visit: Payer: Self-pay | Admitting: Neurology

## 2020-10-11 IMAGING — MR MR CARD MORPHOLOGY WO/W CM
11 of 13 series · 38 of 40 positions shown · IV contrast (Gadavist)
Comparison: none

CLINICAL DATA: 52-year-old male with chronic atrial fibrillation
and an underlying conduction delay (LBBB), evaluate for a possible
cardiomyopathy.

EXAM:
CARDIAC MRI
TECHNIQUE: The patient was scanned on a 1.5 Tesla GE magnet. A dedicated
cardiac coil was used. Functional imaging was done using Fiesta
sequences. [DATE], and 4 chamber views were done to assess for RWMA's.
Modified Tiger rule using a short axis stack was used to
calculate an ejection fraction on a dedicated work station using
Circle software. The patient received 10 cc of Gadavist. After 10
minutes inversion recovery sequences were used to assess for
infiltration and scar tissue.
CONTRAST:  10 cc  of Gadavist

[Series 6: bSSFP · oblique · 8.0mm · 1.61mm/px · 26 of 400 slices shown (1 of 4)]
[im 1/400]
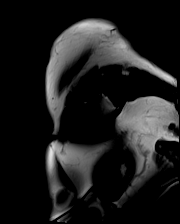
[im 16/400]
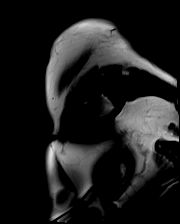
[im 32/400]
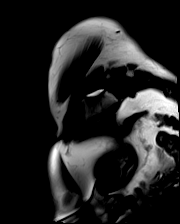
[im 48/400]
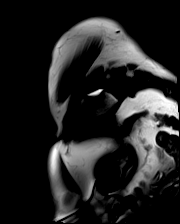
[im 64/400]
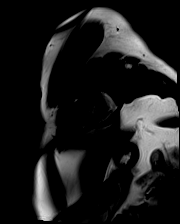
[im 80/400]
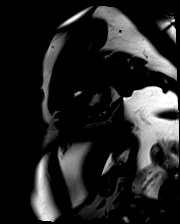
[im 96/400]
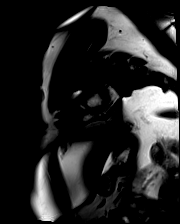
[im 112/400]
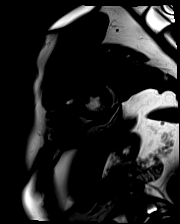
[im 128/400]
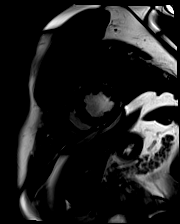
[im 144/400]
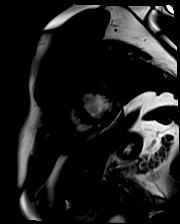
[im 160/400]
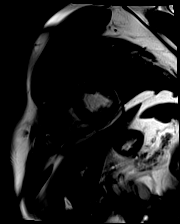
[im 176/400]
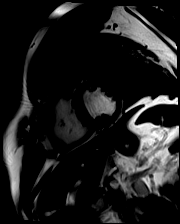
[im 192/400]
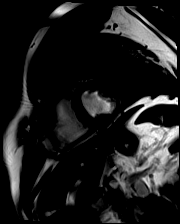
[im 208/400]
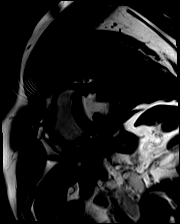
[im 224/400]
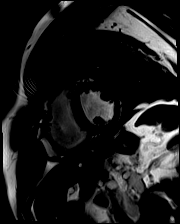
[im 240/400]
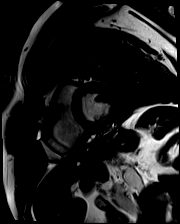
[im 256/400]
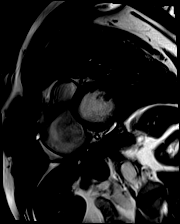
[im 272/400]
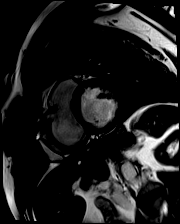
[im 288/400]
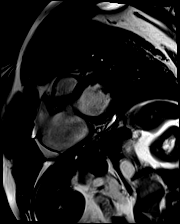
[im 304/400]
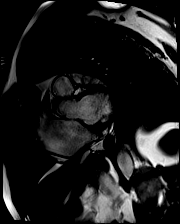
[im 320/400]
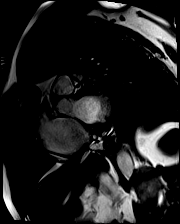
[im 336/400]
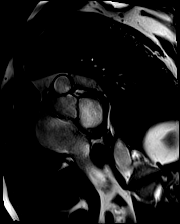
[im 352/400]
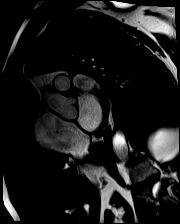
[im 368/400]
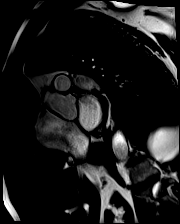
[im 384/400]
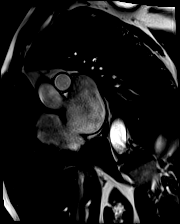
[im 400/400]
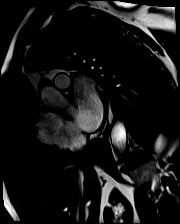

[Series 12: t2_stir_db_sax · oblique · 8.0mm · 1.92mm/px · 1 of 13 slices shown]
[im 1/13]
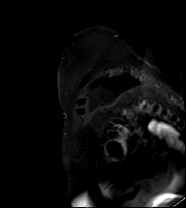

[Series 13: bSSFP · axial · 6.5mm · 1.48mm/px · 1 of 25 slices shown (2 of 4)]
[im 1/25]
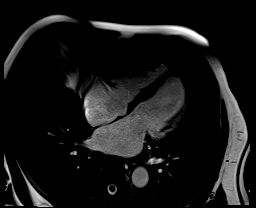

[Series 14: bSSFP · oblique · 6.5mm · 1.48mm/px · 2 of 25 slices shown (3 of 4)]
[im 1/25]
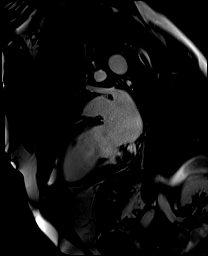
[im 25/25]
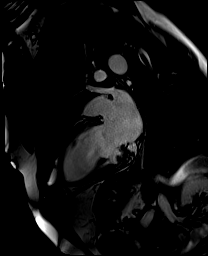

[Series 15: bSSFP · sagittal · 6.5mm · 1.48mm/px · 2 of 25 slices shown (4 of 4)]
[im 1/25]
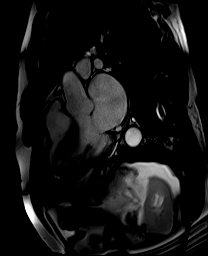
[im 25/25]
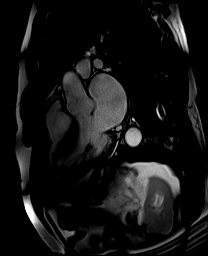

[Series 21: lge short axis_mag · oblique · 8.0mm · 1.50mm/px · 1 of 16 slices shown]
[im 1/16]
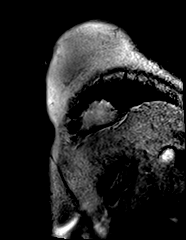

[Series 22: lge short axis_psir · oblique · 8.0mm · 1.50mm/px · 1 of 16 slices shown]
[im 1/16]
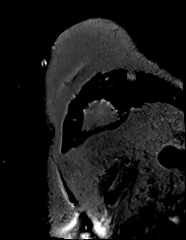

[Series 23: lge radial ((date)ch)_mag · axial · 6.5mm · 1.32mm/px · 1 of 1 slices shown]
[im 1/1]
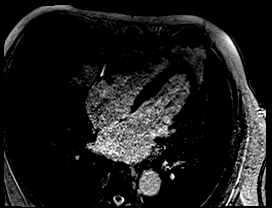

[Series 24: lge radial ((date)ch)_psir · axial · 6.5mm · 1.32mm/px · 1 of 1 slices shown]
[im 1/1]
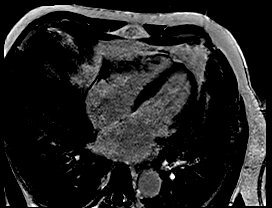

[Series 29: lge_single shot sa · oblique · 8.0mm · 1.98mm/px · 1 of 16 slices shown (1 of 2)]
[im 1/16]
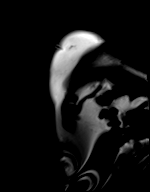

[Series 30: lge_single shot sa · oblique · 8.0mm · 1.98mm/px · 1 of 16 slices shown (2 of 2)]
[im 1/16]
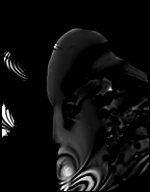

[38 of 40 positions shown; findings below may reference images not displayed]

FINDINGS: 1. Moderately dilated left ventricle with moderate basal septal
hypertrophy and mildly decreased systolic function (LVEF = 49%).
There is mild hypokinesis and paradoxical septal motion. There is no
late gadolinium enhancement in the left ventricular myocardium.

LVEDD: 61 mm

LVESD: 46 mm

LVEDV: 195 ml

LVESV: 99 ml

SV: 96 ml

CO: 7.0 L/min

Myocardial mass: 149 g

2. Normal right ventricular size, thickness and mildly decreased
systolic function (LVEF = 39%). There are no regional wall motion
abnormalities.

3.  Moderate left and right atrial dilatation.

4. Normal size of the aortic root, ascending aorta and pulmonary
artery.

5.  Mild mitral and tricuspid regurgitation.

6.  Normal pericardium.  No pericardial effusion.
IMPRESSION: 1. Moderately dilated left ventricle with moderate basal septal
hypertrophy and mildly decreased systolic function (LVEF = 49%).
There is mild hypokinesis and paradoxical septal motion. There is no
late gadolinium enhancement in the left ventricular myocardium.

2. Normal right ventricular size, thickness and mildly decreased
systolic function (LVEF = 39%). There are no regional wall motion
abnormalities.

3.  Moderate left and right atrial dilatation.

4.  Mild mitral and tricuspid regurgitation.

There is mild bi-ventricular systolic dysfunction, with no evidence
for infiltrative or inflammatory cardiomyopathy. This can be
arrhythmia induced.

## 2020-10-24 ENCOUNTER — Telehealth: Payer: Self-pay | Admitting: Neurology

## 2020-10-24 NOTE — Telephone Encounter (Signed)
Patient called in and made an appointment for 04/24/21. He needs a refill of his Effexor to get him to his next visit. He stated he has about 10 pills left.

## 2020-10-25 ENCOUNTER — Telehealth: Payer: Self-pay | Admitting: Family Medicine

## 2020-10-25 NOTE — Telephone Encounter (Signed)
We typically do not refill medications if patient not seen in over a year.  I recommend that he get refills from his PCP until he can follow up.

## 2020-10-25 NOTE — Telephone Encounter (Signed)
Pt call and want to know if dr.Banks will refill his venlafaxine XR (EFFEXOR-XR) 75 MG 24 hr capsule he would like it sent to  [id number] [place], [place] - [address] Phone:  [phone]  Fax:  [phone]

## 2020-10-25 NOTE — Telephone Encounter (Signed)
Pt advised to call and ask his PCP

## 2020-10-26 ENCOUNTER — Other Ambulatory Visit: Payer: Self-pay | Admitting: Neurology

## 2020-10-31 ENCOUNTER — Telehealth: Payer: Self-pay | Admitting: Neurology

## 2020-10-31 NOTE — Telephone Encounter (Signed)
Pt has appt on 11-10-20 and said that he has enough medication to make it to that appt

## 2020-11-01 NOTE — Telephone Encounter (Signed)
Unfortunately our office has contacted pt numerous times to schedule appointment with Dr  Salomon Fick but has not responded to any of the messages. It seems like pt  is scheduled for a visit at your office on 11/10/2020 at 3.30 pm.

## 2020-11-01 NOTE — Telephone Encounter (Signed)
Please advise 

## 2020-11-01 NOTE — Telephone Encounter (Signed)
I have not seen this patient in over a year.  Would Dr. Salomon Fick be willing to refill this until he is able to follow up with me?

## 2020-11-09 NOTE — Progress Notes (Signed)
NEUROLOGY FOLLOW UP OFFICE NOTE  Dennis Cortez [id number]   Subjective:  Dennis Cortez is a 54 year old left-handed male with atrial fibrillation (on Xarelto), MVP, hyperlipidemia, migraine and history of right occipital stroke and alcohol use who follows up for migraine.  UPDATE: No headaches over past year.  Anxiety controlled. Intensity:  0 Duration:  0 Frequency:  0 Frequency of abortive medication: none Current NSAIDS:no Current analgesics:no Current triptans:no Current ergotamine:no Current anti-emetic:no Current muscle relaxants:no Current anti-anxiolytic:no Current sleep aide:no Current Antihypertensive medications:lisinopril 10mg  Current Antidepressant medications:Venlafaxine XR 75mg  daily  Current Anticonvulsant medications:no Current anti-CGRP:no Current Vitamins/Herbal/Supplements:fish oil Current Antihistamines/Decongestants:no Other therapy:no Other medication:Xarelto, Crestor 20mg , allopurinol, colchicine   HISTORY: On 05/24/14, he had sudden onset of vision loss while driving.He describes it as left homonymous hemianopsia with slanting up to the right.The visual disturbance was monocular, involving each eye individually.It lasted about 45 minutes and resolved.Afterwards, he developed a right retro-orbital aching pain, lasting about a day.There was no associated double vision, unilateral weakness, unilateral numbness, dizziness, or slurred speech.MRI of the brain with and without contrast revealed a small acute right occipital infarct.Carotid dopplers revealed no hemodynamically significant stenosis.He was started on Xarelto.Visual field cut resolved the next day.  He reports an episode of probable ocular migraine that occurred several years ago.At that time, he developed hemi-vision loss associated with wavy lines in only his left eye, and lasting 10 to 15 minutes.It was not associated with  headache.He also has history of migraines, presenting as left retro-orbital headaches, 4/10, and sometimesassociated with nausea and photophobia but not visual disturbance, vomiting, phonophobia or unilateral numbness or weakness. They can last anywhere from minutes to several hours. Theyinitiallyoccurred3 to 4 days per week. He has not had any visual symptoms associated with them. There are no specific triggers or relieving factors. He is unable to take ASA or NSAIDs as per cardiology. He does report remote history of headaches as a child, in which he had to lay down with a rag over his eyes.These were associated with nausea.They resolved after age 45 or 39.He is adopted so family history is unknown.  PAST MEDICAL HISTORY: Past Medical History:  Diagnosis Date   Atrial fibrillation (HCC)    Cardioembolic stroke (HCC) 05/19/2015   Chronic anticoagulation 09/15/2015   Esophageal reflux 09/15/2015   Generalized anxiety disorder 05/19/2015   H/O exercise stress test 02/2013   a. ETT-Echo with normal images but significant ST depression and hypotensive response to exercise   Hx of cardiac catheterization    LHC 03/09/13:  Smooth and normal coronary arteries, normal LVF   Hyperlipemia 10/22/2007   Qualifier: Diagnosis of  By: 03/2013 MD, 03/11/13    Hyperlipidemia    LVH (left ventricular hypertrophy)    a. Echo 02/2013:  Mild LVH, EF 55-65%, normal wall motion, mild LAE, mild RVE, mild RAE    Migraine without aura and without status migrainosus, not intractable 05/19/2015   MVP (mitral valve prolapse)     MEDICATIONS: Current Outpatient Medications on File Prior to Visit  Medication Sig Dispense Refill   allopurinol (ZYLOPRIM) 100 MG tablet TAKE 1 TABLET BY MOUTH EVERY DAY 90 tablet 1   colchicine 0.6 MG tablet TAKE 2 TABS AT FIRST FIGN OF GOUT, MAY REPEAT 1 TAB IN AN HOUR. TAKE DAILY AFTER FOR UP TO 10 DAYS 30 tablet 1   fish oil-omega-3 fatty acids 1000 MG capsule  Take 2 g by mouth daily.       losartan (COZAAR)  25 MG tablet Take 1 tablet (25 mg total) by mouth daily. 90 tablet 3   Multiple Vitamin (MULTIVITAMIN) tablet Take 1 tablet by mouth daily.       ondansetron (ZOFRAN) 4 MG tablet Take 1 tablet (4 mg total) by mouth every 8 (eight) hours as needed for nausea or vomiting. 20 tablet 0   rosuvastatin (CRESTOR) 20 MG tablet TAKE 1 TABLET BY MOUTH EVERY DAY 90 tablet 1   venlafaxine XR (EFFEXOR-XR) 75 MG 24 hr capsule TAKE 1 CAPSULE BY MOUTH EVERY DAY 30 capsule 0   XARELTO 20 MG TABS tablet TAKE 1 TABLET BY MOUTH EVERY DAY WITH SUPPER 90 tablet 1   No current facility-administered medications on file prior to visit.    ALLERGIES: No Known Allergies  FAMILY HISTORY: Family History  Adopted: Yes    SOCIAL HISTORY: Social History   Socioeconomic History   Marital status: Married    Spouse name: Not on file   Number of children: 0   Years of education: Not on file   Highest education level: Associate degree: academic program  Occupational History   Not on file  Tobacco Use   Smoking status: Never Smoker   Smokeless tobacco: Never Used  Vaping Use   Vaping Use: Never used  Substance and Sexual Activity   Alcohol use: Yes    Alcohol/week: 0.0 standard drinks    Comment: 2 beers per day   Drug use: No   Sexual activity: Yes    Partners: Female  Other Topics Concern   Not on file  Social History Narrative   Not on file   Social Determinants of Health   Financial Resource Strain:    Difficulty of Paying Living Expenses: Not on file  Food Insecurity:    Worried About Programme researcher, broadcasting/film/video in the Last Year: Not on file   [employer] of Food in the Last Year: Not on file  Transportation Needs:    Lack of Transportation (Medical): Not on file   Lack of Transportation (Non-Medical): Not on file  Physical Activity:    Days of Exercise per Week: Not on file   Minutes of Exercise per Session: Not on file    Stress:    Feeling of Stress : Not on file  Social Connections:    Frequency of Communication with Friends and Family: Not on file   Frequency of Social Gatherings with Friends and Family: Not on file   Attends Religious Services: Not on file   Active Member of Clubs or Organizations: Not on file   Attends Banker Meetings: Not on file   Marital Status: Not on file  Intimate Partner Violence:    Fear of Current or Ex-Partner: Not on file   Emotionally Abused: Not on file   Physically Abused: Not on file   Sexually Abused: Not on file     Objective:  Blood pressure 111/65, pulse 76, height 6\' 2"  (1.88 m), weight 213 lb 12.8 oz (97 kg), SpO2 98 %.  General: No acute distress.  Patient appears well-groomed.   Head:  Normocephalic/atraumatic Eyes:  Fundi examined but not visualized Neck: supple, no paraspinal tenderness, full range of motion Heart:  Regular rate and rhythm Lungs:  Clear to auscultation bilaterally Back: No paraspinal tenderness Neurological Exam: alert and oriented to person, place, and time. Attention span and concentration intact, recent and remote memory intact, fund of knowledge intact.  Speech fluent and not dysarthric, language intact.  CN II-XII  intact. Bulk and tone normal, muscle strength 5/5 throughout.  Sensation to light touch, temperature and vibration intact.  Deep tendon reflexes 2+ throughout, toes downgoing.  Finger to nose and heel to shin testing intact.  Gait normal, Romberg negative.   Assessment/Plan:   1.  Migraine without aura, without status migrainosus, not intractable 2.  History of right occipital ischemic infarct, cryptogenic.  May have been cardioembolic or migraine with persistent aura and infarct. 3.  Atrial fibrillation 4.  Anxiety, stable  1.  Migraine preventative and anxiety management:  Venlafaxine XR 75mg  daily 2.  Secondary stroke prevention as managed by PCP and cardiology: -  Xarelto - Crestor (LDL  goal less than 70) - Blood pressure control - Glycemic control (Hgb A1c goal less than 7) 3.  Limit use of pain relievers to no more than 2 days out of week to prevent risk of rebound or medication-overuse headache. 4.  Keep headache diary 5.  Follow up one year  , DO  CC: Shon Millet, MD

## 2020-11-10 ENCOUNTER — Other Ambulatory Visit: Payer: Self-pay

## 2020-11-10 ENCOUNTER — Ambulatory Visit: Payer: BC Managed Care – PPO | Admitting: Neurology

## 2020-11-10 ENCOUNTER — Encounter: Payer: Self-pay | Admitting: Neurology

## 2020-11-10 VITALS — BP 111/65 | HR 76 | Ht 74.0 in | Wt 213.8 lb

## 2020-11-10 DIAGNOSIS — G43009 Migraine without aura, not intractable, without status migrainosus: Secondary | ICD-10-CM

## 2020-11-10 DIAGNOSIS — I4891 Unspecified atrial fibrillation: Secondary | ICD-10-CM | POA: Diagnosis not present

## 2020-11-10 DIAGNOSIS — I63431 Cerebral infarction due to embolism of right posterior cerebral artery: Secondary | ICD-10-CM | POA: Diagnosis not present

## 2020-11-10 DIAGNOSIS — F411 Generalized anxiety disorder: Secondary | ICD-10-CM | POA: Diagnosis not present

## 2020-11-10 MED ORDER — VENLAFAXINE HCL ER 75 MG PO CP24: ORAL_CAPSULE | ORAL | 1 refills | Status: DC

## 2020-11-10 NOTE — Patient Instructions (Signed)
Refilled venlafaxine.

## 2021-01-14 ENCOUNTER — Other Ambulatory Visit: Payer: Self-pay | Admitting: Physician Assistant

## 2021-01-22 ENCOUNTER — Encounter: Payer: Self-pay | Admitting: Cardiovascular Disease

## 2021-01-22 NOTE — Progress Notes (Signed)
Dennis Cortez Date of Birth  [birth year]       [facility] Office     [address], [address]   [place], [place]  [address]    [phone]       Fax  [phone]      Problem List: 1. Atrial Fibrillation - TIA  2. Hyperlipidemia   Dennis Cortez is doing well.  Asymptomatic.  His last echo was 3 years ago.  He is working out at [employer] on a regular basis.  He has never had any symptoms related to his A-Fib.  His CHADS score is 0  March 29, 2014:  Dennis Cortez is doing well.  Not working out as much as he would like . Doing well from a cardiac standpoint.  No CP or dyspnea.     July 30, 2014: Dennis Cortez is seen today for followup of his atrial fibrillation. He's been maintained on aspirin because of his low chads Vasc score.  He presented several months after I saw him with some visual disturbances and headache  and was found stroke. He was started on Xarelto at that time.   He feels ok but is still having some headaches.    Sept. 19, 2016:    Doing well.  Has been walking.     No CP or dyspnea.    Oct. 4, 2017:  Dennis Cortez is seen today for follow-up of his atrial fibrillation and LBBB. Tough year. His mother died this past year. managment changes at work   Nov. 21, 2018: Dennis Cortez is seen today for a follow-up of his chronic atrial fibrillation and left bundle branch block. Echocardiogram in March, 2014 showed normal left ventricular systolic function. Has had Afib for 20+ years.   We have not tried cardioversion.   We discussed the fact that cardioversion would likely not be successful Work has been stressful.   Nov. 22, 2019:  Dennis Cortez is seen today for follow-up of his atrial fibrillation and left bundle branch block. Staying active .   Working out some .   Chol and LDL are elevated.    LDL was 122 on Crestor 10 mg a day   December 24, 2018: Is seen back today for a follow-up of his chronic atrial fibrillation left bundle branch block.  Because of his chronic atrial fibrillation  and conduction abnormality, we performed a cardiac MRI following his last office visit.  His left ventricular systolic function is mildly depressed with an ejection fraction of 49%.  No evidence of scarring.  There is no suggestion of sarcoidosis.  Feb. 14, 2022: Dennis Cortez is seen today for follow up visit.   Hx of Atrial fib, LBBB, mildly reduced LV EF .  Cardiac MRI showed no LV scarring , no sarcoidosis, no amyloidosis,  EF = 49%.  Its been 2 years since Dennis Cortez seen him. Saw Dennis Cortez last year Had Covid last year ( likely Delta or original strain )  Is trying to exercise   Current Outpatient Medications on File Prior to Visit  Medication Sig Dispense Refill  . allopurinol (ZYLOPRIM) 100 MG tablet TAKE 1 TABLET BY MOUTH EVERY DAY 90 tablet 1  . colchicine 0.6 MG tablet TAKE 2 TABS AT FIRST FIGN OF GOUT, MAY REPEAT 1 TAB IN AN HOUR. TAKE DAILY AFTER FOR UP TO 10 DAYS 30 tablet 1  . fish oil-omega-3 fatty acids 1000 MG capsule Take 2 g by mouth daily.    Marland Kitchen losartan (COZAAR) 25 MG tablet Take 1 tablet (  25 mg total) by mouth daily. Please call to schedule appointment for future refills. 1st attempt 30 tablet 0  . Multiple Vitamin (MULTIVITAMIN) tablet Take 1 tablet by mouth daily.    . ondansetron (ZOFRAN) 4 MG tablet Take 1 tablet (4 mg total) by mouth every 8 (eight) hours as needed for nausea or vomiting. 20 tablet 0  . rosuvastatin (CRESTOR) 20 MG tablet TAKE 1 TABLET BY MOUTH EVERY DAY 90 tablet 1  . venlafaxine XR (EFFEXOR-XR) 75 MG 24 hr capsule TAKE 1 CAPSULE BY MOUTH EVERY DAY 90 capsule 1  . XARELTO 20 MG TABS tablet TAKE 1 TABLET BY MOUTH EVERY DAY WITH SUPPER 90 tablet 1   No current facility-administered medications on file prior to visit.    No Known Allergies  Past Medical History:  Diagnosis Date  . Atrial fibrillation (HCC)   . Cardioembolic stroke (HCC) 05/19/2015  . Chronic anticoagulation 09/15/2015  . COVID   . Esophageal reflux 09/15/2015  . Generalized anxiety disorder  05/19/2015  . H/O exercise stress test 02/2013   a. ETT-Echo with normal images but significant ST depression and hypotensive response to exercise  . Hx of cardiac catheterization    LHC 03/09/13:  Smooth and normal coronary arteries, normal LVF  . Hyperlipemia 10/22/2007   Qualifier: Diagnosis of  By: Alphonzo Severance MD, Loni Dolly   . Hyperlipidemia   . LVH (left ventricular hypertrophy)    a. Echo 02/2013:  Mild LVH, EF 55-65%, normal wall motion, mild LAE, mild RVE, mild RAE   . Migraine without aura and without status migrainosus, not intractable 05/19/2015  . MVP (mitral valve prolapse)     Past Surgical History:  Procedure Laterality Date  . KNEE SURGERY    . US ECHOCARDIOGRAPHY  09/01/2008   EF 55-60%  . US ECHOCARDIOGRAPHY  08/17/2005   EF 55-60%    Social History   Tobacco Use  Smoking Status Never Smoker  Smokeless Tobacco Never Used    Social History   Substance and Sexual Activity  Alcohol Use Yes  . Alcohol/week: 0.0 standard drinks   Comment: 2 beers per day    Family History  Adopted: Yes    Reviw of Systems:  Reviewed in the HPI.  All other systems are negative.   Physical Exam: Blood pressure 108/84, pulse 62, height 6\' 2"  (1.88 m), weight 216 lb 3.2 oz (98.1 kg), SpO2 98 %.  GEN:  Well nourished, well developed in no acute distress HEENT: Normal NECK: No JVD; No carotid bruits LYMPHATICS: No lymphadenopathy CARDIAC: irreg.  Irreg.   RESPIRATORY:  Clear to auscultation without rales, wheezing or rhonchi  ABDOMEN: Soft, non-tender, non-distended MUSCULOSKELETAL:  No edema; No deformity  SKIN: Warm and dry NEUROLOGIC:  Alert and oriented x 3   ECG:  Feb. 14, 2022:   Atrial fib with HR 65.  LBBB.     Assessment / Plan:   1. Atrial Fibrillation  -chronic, stable, continue Xarelto.   2. Hyperlipidemia - .  Continue current medications.  Will check labs today.  3. CVA:    .     4.  Mildly reduced left ventricular systolic function.  He has mildly  reduced LV systolic function.  He has a left bundle branch block.  His last ejection fraction was 49% by cardiac MRI in December, 2019.  We will recheck an echocardiogram.  Continue low-dose losartan.  His heart rate is slow so we do not have any room to add beta-blocker or  Aldactone.  We will continue to watch for any signs of worsening LV function.  He would be a candidate for CRT pacer at that time.     Kristeen Miss, MD  01/23/2021 11:03 AM    [facility] [address] [place],  [address] [place], [place]  [address] Pager [phone] Phone: [phone]; Fax: [phone]

## 2021-01-23 ENCOUNTER — Encounter: Payer: Self-pay | Admitting: Cardiovascular Disease

## 2021-01-23 ENCOUNTER — Other Ambulatory Visit: Payer: Self-pay

## 2021-01-23 ENCOUNTER — Ambulatory Visit: Payer: BC Managed Care – PPO | Admitting: Cardiovascular Disease

## 2021-01-23 VITALS — BP 108/84 | HR 62 | Ht 74.0 in | Wt 216.2 lb

## 2021-01-23 DIAGNOSIS — I447 Left bundle-branch block, unspecified: Secondary | ICD-10-CM

## 2021-01-23 DIAGNOSIS — E785 Hyperlipidemia, unspecified: Secondary | ICD-10-CM

## 2021-01-23 DIAGNOSIS — I482 Chronic atrial fibrillation, unspecified: Secondary | ICD-10-CM

## 2021-01-23 DIAGNOSIS — I5022 Chronic systolic (congestive) heart failure: Secondary | ICD-10-CM | POA: Insufficient documentation

## 2021-01-23 DIAGNOSIS — I5042 Chronic combined systolic (congestive) and diastolic (congestive) heart failure: Secondary | ICD-10-CM

## 2021-01-23 LAB — BASIC METABOLIC PANEL
BUN/Creatinine Ratio: 17 (ref 9–20)
BUN: 16 mg/dL (ref 6–24)
CO2: 23 mmol/L (ref 20–29)
Calcium: 9.5 mg/dL (ref 8.7–10.2)
Chloride: 101 mmol/L (ref 96–106)
Creatinine, Ser: 0.94 mg/dL (ref 0.76–1.27)
GFR calc Af Amer: 104 mL/min/{1.73_m2} (ref >59)
GFR calc non Af Amer: 90 mL/min/{1.73_m2} (ref >59)
Glucose: 91 mg/dL (ref 65–99)
Potassium: 4.5 mmol/L (ref 3.5–5.2)
Sodium: 137 mmol/L (ref 134–144)

## 2021-01-23 LAB — LIPID PANEL
Chol/HDL Ratio: 2.6 ratio (ref 0.0–5.0)
Cholesterol, Total: 188 mg/dL (ref 100–199)
HDL: 73 mg/dL (ref >39)
LDL Chol Calc (NIH): 100 mg/dL — ABNORMAL HIGH (ref 0–99)
Triglycerides: 80 mg/dL (ref 0–149)
VLDL Cholesterol Cal: 15 mg/dL (ref 5–40)

## 2021-01-23 LAB — ALT: ALT: 22 IU/L (ref 0–44)

## 2021-01-23 MED ORDER — LOSARTAN POTASSIUM 25 MG PO TABS: 25.0000 mg | ORAL_TABLET | Freq: Every day | ORAL | 3 refills | Status: DC

## 2021-01-23 NOTE — Patient Instructions (Addendum)
Medication Instructions:  Your physician recommends that you continue on your current medications as directed. Please refer to the Current Medication list given to you today.  *If you need a refill on your cardiac medications before your next appointment, please call your pharmacy*   Lab Work: TODAY: BMET, LIPIDS, ALT If you have labs (blood work) drawn today and your tests are completely normal, you will receive your results only by: Marland Kitchen MyChart Message (if you have MyChart) OR . A paper copy in the mail If you have any lab test that is abnormal or we need to change your treatment, we will call you to review the results.   Testing/Procedures: Your physician has requested that you have an echocardiogram. Echocardiography is a painless test that uses sound waves to create images of your heart. It provides your doctor with information about the size and shape of your heart and how well your heart's chambers and valves are working. This procedure takes approximately one hour. There are no restrictions for this procedure.     Follow-Up: At [facility], you and your health needs are our priority.  As part of our continuing mission to provide you with exceptional heart care, we have created designated Provider Care Teams.  These Care Teams include your primary Cardiologist (physician) and Advanced Practice Providers (APPs -  Physician Assistants and Nurse Practitioners) who all work together to provide you with the care you need, when you need it.  We recommend signing up for the patient portal called "MyChart".  Sign up information is provided on this After Visit Summary.  MyChart is used to connect with patients for Virtual Visits (Telemedicine).  Patients are able to view lab/test results, encounter notes, upcoming appointments, etc.  Non-urgent messages can be sent to your provider as well.   To learn more about what you can do with MyChart, go to [email or web address].    Your next  appointment:   1 year(s)  The format for your next appointment:   In Person  Provider:   You may see Kristeen Miss, MD or one of the following Advanced Practice Providers on your designated Care Team:    Tereso Newcomer, PA-C  [place], [place]

## 2021-01-24 ENCOUNTER — Other Ambulatory Visit: Payer: Self-pay | Admitting: *Deleted

## 2021-01-24 DIAGNOSIS — E785 Hyperlipidemia, unspecified: Secondary | ICD-10-CM

## 2021-01-24 DIAGNOSIS — I5042 Chronic combined systolic (congestive) and diastolic (congestive) heart failure: Secondary | ICD-10-CM

## 2021-01-24 MED ORDER — EZETIMIBE 10 MG PO TABS: 10.0000 mg | ORAL_TABLET | Freq: Every day | ORAL | 3 refills | Status: DC

## 2021-01-31 ENCOUNTER — Other Ambulatory Visit: Payer: Self-pay | Admitting: Family Medicine

## 2021-01-31 NOTE — Telephone Encounter (Signed)
Pt needs appointment for further refills 

## 2021-02-16 ENCOUNTER — Other Ambulatory Visit: Payer: Self-pay

## 2021-02-16 ENCOUNTER — Ambulatory Visit (HOSPITAL_COMMUNITY): Payer: BC Managed Care – PPO | Attending: Cardiology

## 2021-02-16 DIAGNOSIS — I482 Chronic atrial fibrillation, unspecified: Secondary | ICD-10-CM | POA: Insufficient documentation

## 2021-02-16 DIAGNOSIS — I447 Left bundle-branch block, unspecified: Secondary | ICD-10-CM | POA: Insufficient documentation

## 2021-02-16 DIAGNOSIS — I5022 Chronic systolic (congestive) heart failure: Secondary | ICD-10-CM | POA: Insufficient documentation

## 2021-02-16 LAB — ECHOCARDIOGRAM COMPLETE
Area-P 1/2: 3.73 cm2
S' Lateral: 4.75 cm

## 2021-02-16 MED ORDER — PERFLUTREN LIPID MICROSPHERE
1.0000 mL | INTRAVENOUS | Status: AC | PRN
  Administered 2021-02-16: 1 mL via INTRAVENOUS

## 2021-02-17 ENCOUNTER — Telehealth: Payer: Self-pay | Admitting: *Deleted

## 2021-02-17 DIAGNOSIS — I5022 Chronic systolic (congestive) heart failure: Secondary | ICD-10-CM

## 2021-02-17 NOTE — Telephone Encounter (Signed)
-----   Message from Vesta Mixer, MD sent at 02/17/2021  3:32 PM EST ----- His EF has dropped. His is on Losartan ,  cannot tolerate coreg due to low HR  Please refer to EF for consideration for Bi-V pacer / ICD  Please call with appt with EP   I have called and discussed with Mckade. He understands the plan

## 2021-03-14 ENCOUNTER — Other Ambulatory Visit: Payer: Self-pay | Admitting: Cardiovascular Disease

## 2021-03-14 NOTE — Telephone Encounter (Signed)
Xarelto 20mg  refill request received. Pt is 55 years old, weight-98.1kg, Crea-0.94 on 01/23/2021, last seen by Dr. 01/25/2021 on 01/23/2021, Diagnosis-Afib, CrCl-121.104ml/min; Dose is appropriate based on dosing criteria. Will send in refill to requested pharmacy.

## 2021-03-15 ENCOUNTER — Ambulatory Visit: Payer: BC Managed Care – PPO | Admitting: Internal Medicine

## 2021-03-15 ENCOUNTER — Other Ambulatory Visit: Payer: Self-pay

## 2021-03-15 ENCOUNTER — Encounter: Payer: Self-pay | Admitting: Internal Medicine

## 2021-03-15 VITALS — BP 118/78 | HR 80 | Ht 74.0 in | Wt 216.0 lb

## 2021-03-15 DIAGNOSIS — I447 Left bundle-branch block, unspecified: Secondary | ICD-10-CM

## 2021-03-15 DIAGNOSIS — I5042 Chronic combined systolic (congestive) and diastolic (congestive) heart failure: Secondary | ICD-10-CM

## 2021-03-15 LAB — CBC WITH DIFFERENTIAL/PLATELET
Basophils Absolute: 0 10*3/uL (ref 0.0–0.2)
Basos: 1 %
EOS (ABSOLUTE): 0.1 10*3/uL (ref 0.0–0.4)
Eos: 1 %
Hematocrit: 41.4 % (ref 37.5–51.0)
Hemoglobin: 14.2 g/dL (ref 13.0–17.7)
Immature Grans (Abs): 0 10*3/uL (ref 0.0–0.1)
Immature Granulocytes: 0 %
Lymphocytes Absolute: 1.7 10*3/uL (ref 0.7–3.1)
Lymphs: 27 %
MCH: 32.9 pg (ref 26.6–33.0)
MCHC: 34.3 g/dL (ref 31.5–35.7)
MCV: 96 fL (ref 79–97)
Monocytes Absolute: 0.6 10*3/uL (ref 0.1–0.9)
Monocytes: 9 %
Neutrophils Absolute: 3.9 10*3/uL (ref 1.4–7.0)
Neutrophils: 62 %
Platelets: 185 10*3/uL (ref 150–450)
RBC: 4.32 x10E6/uL (ref 4.14–5.80)
RDW: 12.8 % (ref 11.6–15.4)
WBC: 6.3 10*3/uL (ref 3.4–10.8)

## 2021-03-15 LAB — BASIC METABOLIC PANEL
BUN/Creatinine Ratio: 13 (ref 9–20)
BUN: 13 mg/dL (ref 6–24)
CO2: 22 mmol/L (ref 20–29)
Calcium: 9.6 mg/dL (ref 8.7–10.2)
Chloride: 102 mmol/L (ref 96–106)
Creatinine, Ser: 1 mg/dL (ref 0.76–1.27)
Glucose: 96 mg/dL (ref 65–99)
Potassium: 4.6 mmol/L (ref 3.5–5.2)
Sodium: 139 mmol/L (ref 134–144)
eGFR: 88 mL/min/{1.73_m2} (ref >59)

## 2021-03-15 NOTE — Patient Instructions (Addendum)
Medication Instructions:  Your physician recommends that you continue on your current medications as directed. Please refer to the Current Medication list given to you today.  Labwork: You will get lab work today:  CBC and BMP  Testing/Procedures: None ordered.  Follow-Up:  SEE INSTRUCTION LETTER  Any Other Special Instructions Will Be Listed Below (If Applicable).  If you need a refill on your cardiac medications before your next appointment, please call your pharmacy.    Biventricular Pacemaker Implantation A biventricular pacemaker implantation is a procedure to place (implant) a pacemaker near the heart. A pacemaker is a small, battery-powered device that helps control the heartbeat. If the heart beats irregularly or too slowly (bradycardia), the pacemaker will pace the heart so that it beats at a normal rate or a programmed rate. The parts of a biventricular pacemaker include:  The pulse generator. The pulse generator contains a small computer and a memory system that is programmed to keep the heart beating at a certain rate. The pulse generator also produces the electrical signal that triggers the heart to beat. This is implanted under the skin of the upper chest, near the collarbone.  Wires (leads). There may be two or three leads placed in the heart--one to the right atrium, one to the right ventricle, and one through the coronary sinus to reach the left ventricle of the heart. The leads are connected to the pulse generator. They transmit electrical pulses from the pulse generator to the heart. A biventricular pacemaker is used in people with heart failure due to weak heart muscles. The pacemaker can help the heart chambers pump more efficiently. This procedure may be done to treat:  Symptoms of severe heart failure, such as shortness of breath (dyspnea).  Loss of consciousness that happens repeatedly (syncope) because of an irregular heart rate. Tell a health care provider  about:  Any allergies you have.  All medicines you are taking, including vitamins, herbs, eye drops, creams, and over-the-counter medicines.  Any problems you or family members have had with anesthetic medicines.  Any blood disorders you have.  Any surgeries you have had.  Any medical conditions you have.  Whether you are pregnant or may be pregnant. What are the risks? Generally, this is a safe procedure. However, problems may occur, including:  Infection.  Swelling, bruising, or bleeding at the pacemaker site, especially if you take blood thinners.  Allergic reactions to medicines or dyes.  Damage to blood vessels or nerves near the pacemaker.  Failure of the pacemaker to improve your condition.  Collapsed lung.  Blood clots.  Lead failures. This may require more surgery. What happens before the procedure? Medicines Ask your health care provider about:  Changing or stopping your regular medicines. This is especially important if you are taking diabetes medicines or blood thinners.  Taking medicines such as aspirin and ibuprofen. These medicines can thin your blood. Do not take these medicines unless your health care provider tells you to take them.  Taking over-the-counter medicines, vitamins, herbs, and supplements. Eating and drinking Follow instructions from your health care provider about eating and drinking, which may include:  8 hours before the procedure - stop eating heavy meals or foods, such as meat, fried foods, or fatty foods.  6 hours before the procedure - stop eating light meals or foods, such as toast or cereal.  6 hours before the procedure - stop drinking milk or drinks that contain milk.  2 hours before the procedure - stop drinking clear   liquids. Staying hydrated Follow instructions from your health care provider about hydration, which may include:  Up to 2 hours before the procedure - you may continue to drink clear liquids, such as water,  clear fruit juice, black coffee, and plain tea.   General instructions  Do not use any products that contain nicotine or tobacco for at least 4 weeks before the procedure. These products include cigarettes, e-cigarettes, and chewing tobacco. If you need help quitting, ask your health care provider.  You may have tests, including: ? Blood tests. ? Chest X-rays. ? Echocardiogram. This is a test that uses sound waves (ultrasound) to produce an image of the heart.  Ask your health care provider: ? How your surgery site will be marked. ? What steps will be taken to help prevent infection. These may include:  Removing hair at the surgery site.  Washing skin with a germ-killing soap.  Taking antibiotic medicine.  Plan to have someone take you home from the hospital or clinic.  If you will be going home right after the procedure, plan to have someone with you for 24 hours. What happens during the procedure?  An IV will be inserted into one of your veins.  You will be connected to a heart monitor. Large electrode pads will be placed on the front and back of your chest.  You will be given one or more of the following: ? A medicine to help you relax (sedative). ? A medicine to make you fall asleep (general anesthetic). ? A medicine that is injected into an area of your body to numb the area (local anesthetic).  An incision will be made in your upper chest, near your heart.  The leads will be guided into your incision, through your blood vessels, and into your heart. Your health care provider will use an X-ray machine (fluoroscope) to guide the leads into your heart.  The leads will be attached to your heart muscles and to the pulse generator.  The leads will be tested to make sure that they work correctly.  The pulse generator will be implanted under your skin, near your incision.  The heart monitor will be watched to ensure that the pacer is working correctly.  Your incision will  be closed with stitches (sutures), skin glue, or adhesive tape.  A bandage (dressing) will be placed over your incision. The procedure may vary among health care providers and hospitals.   What happens after the procedure?  Your blood pressure, heart rate, breathing rate, and blood oxygen level will be monitored until you leave the hospital or clinic.  You may continue to receive fluids and medicines through an IV.  You will be given pain medicine as needed.  You will have a chest X-ray done. This is to make sure that your pacemaker is in the right place.  You will be given a pacemaker identification card. This card lists the implant date, device model, and manufacturer of your pacemaker.  Do not drive for 24 hours if you were given a sedative during your procedure. Summary  A pacemaker is a small, battery-powered device that helps control the heartbeat.  A biventricular pacemaker is used in people with heart failure due to weak heart muscles.  Follow instructions from your health care provider about taking medicines and about eating and drinking before the procedure.  You will be given a pacemaker identification card that lists the implant date, device model, and manufacturer of your pacemaker. This information is not intended   to replace advice given to you by your health care provider. Make sure you discuss any questions you have with your health care provider. Document Revised: 10/29/2018 Document Reviewed: 10/29/2018 Elsevier Patient Education  2021 Elsevier Inc.       

## 2021-03-15 NOTE — Progress Notes (Signed)
HPI [name] is referred by Dr. Elease Hashimoto for evaluation a non-ischemic CM, LBBB and chronic atrial fib. He has class 2 CHF. He has noted gradually worsening fatigue and dyspnea with exertion. He has not had syncope. He has had progressive LV dysfunction and EF is now 30-35%. He has a QRS duration of 160 ms.  No Known Allergies   Current Outpatient Medications  Medication Sig Dispense Refill  . allopurinol (ZYLOPRIM) 100 MG tablet TAKE 1 TABLET BY MOUTH EVERY DAY 90 tablet 0  . colchicine 0.6 MG tablet TAKE 2 TABS AT FIRST FIGN OF GOUT, MAY REPEAT 1 TAB IN AN HOUR. TAKE DAILY AFTER FOR UP TO 10 DAYS 30 tablet 1  . ezetimibe (ZETIA) 10 MG tablet Take 1 tablet (10 mg total) by mouth daily. 90 tablet 3  . fish oil-omega-3 fatty acids 1000 MG capsule Take 2 g by mouth daily.    Marland Kitchen losartan (COZAAR) 25 MG tablet Take 1 tablet (25 mg total) by mouth daily. Please call to schedule appointment for future refills. 1st attempt 90 tablet 3  . Multiple Vitamin (MULTIVITAMIN) tablet Take 1 tablet by mouth daily.    . ondansetron (ZOFRAN) 4 MG tablet Take 1 tablet (4 mg total) by mouth every 8 (eight) hours as needed for nausea or vomiting. 20 tablet 0  . rosuvastatin (CRESTOR) 20 MG tablet TAKE 1 TABLET BY MOUTH EVERY DAY 90 tablet 1  . venlafaxine XR (EFFEXOR-XR) 75 MG 24 hr capsule TAKE 1 CAPSULE BY MOUTH EVERY DAY 90 capsule 1  . XARELTO 20 MG TABS tablet TAKE 1 TABLET BY MOUTH EVERY DAY WITH SUPPER 90 tablet 2   No current facility-administered medications for this visit.     Past Medical History:  Diagnosis Date  . Atrial fibrillation (HCC)   . Cardioembolic stroke (HCC) 05/19/2015  . Chronic anticoagulation 09/15/2015  . COVID   . Esophageal reflux 09/15/2015  . Generalized anxiety disorder 05/19/2015  . H/O exercise stress test 02/2013   a. ETT-Echo with normal images but significant ST depression and hypotensive response to exercise  . Hx of cardiac catheterization    LHC 03/09/13:   Smooth and normal coronary arteries, normal LVF  . Hyperlipemia 10/22/2007   Qualifier: Diagnosis of  By: Alphonzo Severance MD, Loni Dolly   . Hyperlipidemia   . LVH (left ventricular hypertrophy)    a. Echo 02/2013:  Mild LVH, EF 55-65%, normal wall motion, mild LAE, mild RVE, mild RAE   . Migraine without aura and without status migrainosus, not intractable 05/19/2015  . MVP (mitral valve prolapse)     ROS:   All systems reviewed and negative except as noted in the HPI.   Past Surgical History:  Procedure Laterality Date  . KNEE SURGERY    . US ECHOCARDIOGRAPHY  09/01/2008   EF 55-60%  . US ECHOCARDIOGRAPHY  08/17/2005   EF 55-60%     Family History  Adopted: Yes     Social History   Socioeconomic History  . Marital status: Married    Spouse name: Not on file  . Number of children: 0  . Years of education: Not on file  . Highest education level: Associate degree: academic program  Occupational History  . Not on file  Tobacco Use  . Smoking status: Never Smoker  . Smokeless tobacco: Never Used  Vaping Use  . Vaping Use: Never used  Substance and Sexual Activity  . Alcohol use: Yes  Alcohol/week: 0.0 standard drinks    Comment: 2 beers per day  . Drug use: No  . Sexual activity: Yes    Partners: Female  Other Topics Concern  . Not on file  Social History Narrative  . Not on file   Social Determinants of Health   Financial Resource Strain: Not on file  Food Insecurity: Not on file  Transportation Needs: Not on file  Physical Activity: Not on file  Stress: Not on file  Social Connections: Not on file  Intimate Partner Violence: Not on file     BP 118/78   Pulse 80   Ht 6\' 2"  (1.88 m)   Wt 216 lb (98 kg)   BMI 27.73 kg/m   Physical Exam:  Well appearing NAD HEENT: Unremarkable Neck:  No JVD, no thyromegally Lymphatics:  No adenopathy Back:  No CVA tenderness Lungs:  Clear with no wheezes HEART:  IRegular rate rhythm, no murmurs, no rubs, no  clicks Abd:  soft, positive bowel sounds, no organomegally, no rebound, no guarding Ext:  2 plus pulses, no edema, no cyanosis, no clubbing Skin:  No rashes no nodules Neuro:  CN II through XII intact, motor grossly intact  EKG Atrial fib with LBBB  Assess/Plan: 1. Chronic systolic heart failure - he is on maximal medical therapy. He could not take a beta blocker due to bradycardia. I have discussed the treatment options with the patient and the indications/risks/benefits/goals/expectations of BIV ICD vs BIV PPM insertion were discussed and he wishes to proceed. He prefers biv ICD and I agree. 2. Atrial fib - his VR is controlled. He does not have RVR. His atrial fib is chronic and we will not attempt to try and get him back to rhythm after being out of rhythm for over 20 years. His rate appears to be controlled.  Rhondalyn Clingan,MD

## 2021-03-27 ENCOUNTER — Other Ambulatory Visit (HOSPITAL_COMMUNITY)
Admission: RE | Admit: 2021-03-27 | Discharge: 2021-03-27 | Disposition: A | Discharge status: Home / Self Care | Payer: BC Managed Care – PPO | Source: Ambulatory Visit | Attending: Internal Medicine | Admitting: Internal Medicine

## 2021-03-27 DIAGNOSIS — Z01812 Encounter for preprocedural laboratory examination: Secondary | ICD-10-CM | POA: Diagnosis not present

## 2021-03-27 DIAGNOSIS — Z20822 Contact with and (suspected) exposure to covid-19: Secondary | ICD-10-CM | POA: Diagnosis not present

## 2021-03-27 LAB — SARS CORONAVIRUS 2 (TAT 6-24 HRS): SARS Coronavirus 2: NEGATIVE

## 2021-03-28 ENCOUNTER — Telehealth: Payer: Self-pay

## 2021-03-28 DIAGNOSIS — I447 Left bundle-branch block, unspecified: Secondary | ICD-10-CM

## 2021-03-28 DIAGNOSIS — I5042 Chronic combined systolic (congestive) and diastolic (congestive) heart failure: Secondary | ICD-10-CM

## 2021-03-28 NOTE — Pre-Procedure Instructions (Signed)
Instructed patient on the following items: Arrival time 0830 Nothing to eat or drink after midnight No meds AM of procedure Responsible person to drive you home and stay with you for 24 hrs Wash with special soap night before and morning of procedure If on anti-coagulant drug instructions Xarelto- last doses was 4/17

## 2021-03-28 NOTE — Telephone Encounter (Signed)
Per prior auth dept-Pt approval for BIV ICD is still pending.  Will cancel Pt's procedure and continue to monitor for approval.  Pt indicates understanding.

## 2021-03-29 ENCOUNTER — Ambulatory Visit (HOSPITAL_COMMUNITY)
Admission: RE | Admit: 2021-03-29 | Payer: BC Managed Care – PPO | Source: Home / Self Care | Admitting: Internal Medicine

## 2021-03-29 ENCOUNTER — Encounter (HOSPITAL_COMMUNITY): Admission: RE | Payer: Self-pay | Source: Home / Self Care

## 2021-03-29 SURGERY — BIV ICD INSERTION CRT-D

## 2021-03-29 NOTE — Telephone Encounter (Signed)
Reschedule Pt for May 9 at 9:30 am  Need to complete reschedule

## 2021-03-31 NOTE — Telephone Encounter (Signed)
Anticipate approval for device.  Pt rescheduled for May 9  Labs/covid test rescheduled for May 6  Scheduling aware  Reschedule complete.

## 2021-04-11 ENCOUNTER — Ambulatory Visit: Payer: BC Managed Care – PPO

## 2021-04-14 ENCOUNTER — Other Ambulatory Visit: Payer: BC Managed Care – PPO | Admitting: *Deleted

## 2021-04-14 ENCOUNTER — Other Ambulatory Visit: Payer: Self-pay

## 2021-04-14 ENCOUNTER — Other Ambulatory Visit (HOSPITAL_COMMUNITY)
Admission: RE | Admit: 2021-04-14 | Discharge: 2021-04-14 | Disposition: A | Discharge status: Home / Self Care | Payer: BC Managed Care – PPO | Source: Ambulatory Visit | Attending: Internal Medicine | Admitting: Internal Medicine

## 2021-04-14 DIAGNOSIS — E785 Hyperlipidemia, unspecified: Secondary | ICD-10-CM

## 2021-04-14 DIAGNOSIS — Z20822 Contact with and (suspected) exposure to covid-19: Secondary | ICD-10-CM | POA: Diagnosis not present

## 2021-04-14 DIAGNOSIS — I442 Atrioventricular block, complete: Secondary | ICD-10-CM | POA: Diagnosis not present

## 2021-04-14 DIAGNOSIS — Z79899 Other long term (current) drug therapy: Secondary | ICD-10-CM | POA: Diagnosis not present

## 2021-04-14 DIAGNOSIS — Z7901 Long term (current) use of anticoagulants: Secondary | ICD-10-CM | POA: Diagnosis not present

## 2021-04-14 DIAGNOSIS — I5042 Chronic combined systolic (congestive) and diastolic (congestive) heart failure: Secondary | ICD-10-CM

## 2021-04-14 DIAGNOSIS — I5022 Chronic systolic (congestive) heart failure: Secondary | ICD-10-CM | POA: Diagnosis not present

## 2021-04-14 DIAGNOSIS — I447 Left bundle-branch block, unspecified: Secondary | ICD-10-CM

## 2021-04-14 DIAGNOSIS — I4821 Permanent atrial fibrillation: Secondary | ICD-10-CM | POA: Diagnosis not present

## 2021-04-14 DIAGNOSIS — Z01812 Encounter for preprocedural laboratory examination: Secondary | ICD-10-CM | POA: Insufficient documentation

## 2021-04-14 DIAGNOSIS — I428 Other cardiomyopathies: Secondary | ICD-10-CM | POA: Diagnosis not present

## 2021-04-14 LAB — CBC WITH DIFFERENTIAL/PLATELET
Basophils Absolute: 0 10*3/uL (ref 0.0–0.2)
Basos: 1 %
EOS (ABSOLUTE): 0.2 10*3/uL (ref 0.0–0.4)
Eos: 3 %
Hematocrit: 41.9 % (ref 37.5–51.0)
Hemoglobin: 14.4 g/dL (ref 13.0–17.7)
Immature Grans (Abs): 0 10*3/uL (ref 0.0–0.1)
Immature Granulocytes: 0 %
Lymphocytes Absolute: 1.8 10*3/uL (ref 0.7–3.1)
Lymphs: 37 %
MCH: 32.5 pg (ref 26.6–33.0)
MCHC: 34.4 g/dL (ref 31.5–35.7)
MCV: 95 fL (ref 79–97)
Monocytes Absolute: 0.6 10*3/uL (ref 0.1–0.9)
Monocytes: 12 %
Neutrophils Absolute: 2.3 10*3/uL (ref 1.4–7.0)
Neutrophils: 47 %
Platelets: 190 10*3/uL (ref 150–450)
RBC: 4.43 x10E6/uL (ref 4.14–5.80)
RDW: 12.8 % (ref 11.6–15.4)
WBC: 4.9 10*3/uL (ref 3.4–10.8)

## 2021-04-14 LAB — BASIC METABOLIC PANEL
BUN/Creatinine Ratio: 11 (ref 9–20)
BUN: 11 mg/dL (ref 6–24)
CO2: 23 mmol/L (ref 20–29)
Calcium: 9.3 mg/dL (ref 8.7–10.2)
Chloride: 102 mmol/L (ref 96–106)
Creatinine, Ser: 0.96 mg/dL (ref 0.76–1.27)
Glucose: 88 mg/dL (ref 65–99)
Potassium: 4.4 mmol/L (ref 3.5–5.2)
Sodium: 139 mmol/L (ref 134–144)
eGFR: 93 mL/min/{1.73_m2} (ref >59)

## 2021-04-15 LAB — SARS CORONAVIRUS 2 (TAT 6-24 HRS): SARS Coronavirus 2: NEGATIVE

## 2021-04-17 ENCOUNTER — Other Ambulatory Visit: Payer: Self-pay

## 2021-04-17 ENCOUNTER — Ambulatory Visit (HOSPITAL_COMMUNITY)
Admission: RE | Admit: 2021-04-17 | Discharge: 2021-04-17 | Disposition: A | Discharge status: Home / Self Care | Payer: BC Managed Care – PPO | Attending: Internal Medicine | Admitting: Internal Medicine

## 2021-04-17 ENCOUNTER — Ambulatory Visit (HOSPITAL_COMMUNITY): Payer: BC Managed Care – PPO

## 2021-04-17 ENCOUNTER — Encounter (HOSPITAL_COMMUNITY)
Admission: RE | Disposition: A | Discharge status: Home / Self Care | Payer: Self-pay | Source: Home / Self Care | Attending: Internal Medicine

## 2021-04-17 DIAGNOSIS — Z7901 Long term (current) use of anticoagulants: Secondary | ICD-10-CM | POA: Insufficient documentation

## 2021-04-17 DIAGNOSIS — I4821 Permanent atrial fibrillation: Secondary | ICD-10-CM | POA: Diagnosis not present

## 2021-04-17 DIAGNOSIS — Z79899 Other long term (current) drug therapy: Secondary | ICD-10-CM | POA: Insufficient documentation

## 2021-04-17 DIAGNOSIS — I5022 Chronic systolic (congestive) heart failure: Secondary | ICD-10-CM | POA: Diagnosis not present

## 2021-04-17 DIAGNOSIS — I428 Other cardiomyopathies: Secondary | ICD-10-CM | POA: Diagnosis not present

## 2021-04-17 DIAGNOSIS — Z95810 Presence of automatic (implantable) cardiac defibrillator: Secondary | ICD-10-CM

## 2021-04-17 DIAGNOSIS — I442 Atrioventricular block, complete: Secondary | ICD-10-CM | POA: Insufficient documentation

## 2021-04-17 DIAGNOSIS — Z20822 Contact with and (suspected) exposure to covid-19: Secondary | ICD-10-CM | POA: Insufficient documentation

## 2021-04-17 HISTORY — PX: BIV ICD INSERTION CRT-D: EP1195

## 2021-04-17 SURGERY — BIV ICD INSERTION CRT-D

## 2021-04-17 MED ORDER — SODIUM CHLORIDE 0.9 % IV SOLN: INTRAVENOUS | Status: DC

## 2021-04-17 MED ORDER — SODIUM CHLORIDE 0.9 % IV SOLN
INTRAVENOUS | Status: AC
  Filled 2021-04-17: qty 10

## 2021-04-17 MED ORDER — LIDOCAINE HCL (PF) 1 % IJ SOLN
INTRAMUSCULAR | Status: AC
  Filled 2021-04-17: qty 30

## 2021-04-17 MED ORDER — CEFAZOLIN SODIUM-DEXTROSE 1-4 GM/50ML-% IV SOLN
1.0000 g | Freq: Once | INTRAVENOUS | Status: AC
  Administered 2021-04-17: 1 g via INTRAVENOUS

## 2021-04-17 MED ORDER — FENTANYL CITRATE (PF) 100 MCG/2ML IJ SOLN
INTRAMUSCULAR | Status: AC
  Filled 2021-04-17: qty 2

## 2021-04-17 MED ORDER — METOPROLOL TARTRATE 5 MG/5ML IV SOLN
INTRAVENOUS | Status: DC | PRN
  Administered 2021-04-17: 5 mg via INTRAVENOUS

## 2021-04-17 MED ORDER — HEPARIN (PORCINE) IN NACL 1000-0.9 UT/500ML-% IV SOLN
INTRAVENOUS | Status: AC
  Filled 2021-04-17: qty 500

## 2021-04-17 MED ORDER — MIDAZOLAM HCL 5 MG/5ML IJ SOLN
INTRAMUSCULAR | Status: AC
  Filled 2021-04-17: qty 5

## 2021-04-17 MED ORDER — POVIDONE-IODINE 10 % EX SWAB
2.0000 "application " | Freq: Once | CUTANEOUS | Status: AC
  Administered 2021-04-17: 2 via TOPICAL

## 2021-04-17 MED ORDER — SODIUM CHLORIDE 0.9 % IV SOLN
80.0000 mg | INTRAVENOUS | Status: AC
  Administered 2021-04-17: 80 mg
  Filled 2021-04-17: qty 2

## 2021-04-17 MED ORDER — SODIUM CHLORIDE 0.9 % IV SOLN
INTRAVENOUS | Status: AC
  Filled 2021-04-17: qty 2

## 2021-04-17 MED ORDER — MIDAZOLAM HCL 5 MG/5ML IJ SOLN
INTRAMUSCULAR | Status: DC | PRN
  Administered 2021-04-17 (×2): 2 mg via INTRAVENOUS
  Administered 2021-04-17 (×2): 1 mg via INTRAVENOUS
  Administered 2021-04-17 (×2): 2 mg via INTRAVENOUS

## 2021-04-17 MED ORDER — CHLORHEXIDINE GLUCONATE 4 % EX LIQD: 4.0000 "application " | Freq: Once | CUTANEOUS | Status: DC

## 2021-04-17 MED ORDER — ACETAMINOPHEN 325 MG PO TABS: 325.0000 mg | ORAL_TABLET | ORAL | Status: DC | PRN

## 2021-04-17 MED ORDER — CEFAZOLIN SODIUM-DEXTROSE 2-4 GM/100ML-% IV SOLN
INTRAVENOUS | Status: AC
  Filled 2021-04-17: qty 100

## 2021-04-17 MED ORDER — HEPARIN (PORCINE) IN NACL 1000-0.9 UT/500ML-% IV SOLN
INTRAVENOUS | Status: DC | PRN
  Administered 2021-04-17: 500 mL

## 2021-04-17 MED ORDER — FENTANYL CITRATE (PF) 100 MCG/2ML IJ SOLN
INTRAMUSCULAR | Status: DC | PRN
  Administered 2021-04-17 (×2): 25 ug via INTRAVENOUS
  Administered 2021-04-17 (×2): 12.5 ug via INTRAVENOUS
  Administered 2021-04-17 (×2): 25 ug via INTRAVENOUS

## 2021-04-17 MED ORDER — CEFAZOLIN SODIUM-DEXTROSE 2-4 GM/100ML-% IV SOLN
2.0000 g | INTRAVENOUS | Status: AC
  Administered 2021-04-17: 2 g via INTRAVENOUS

## 2021-04-17 MED ORDER — METOPROLOL TARTRATE 5 MG/5ML IV SOLN
INTRAVENOUS | Status: AC
  Filled 2021-04-17: qty 5

## 2021-04-17 MED ORDER — LIDOCAINE HCL (PF) 1 % IJ SOLN
INTRAMUSCULAR | Status: DC | PRN
  Administered 2021-04-17: 60 mL

## 2021-04-17 SURGICAL SUPPLY — 22 items
CABLE SURGICAL S-101-97-12 (CABLE) ×2 IMPLANT
CATH CPS DIRECT 135 DS2C020 (CATHETERS) ×2 IMPLANT
CATH JOSEPH QUAD ALLRED 6F REP (CATHETERS) ×2 IMPLANT
CATH RIGHTSITE C315HIS02 (CATHETERS) ×2 IMPLANT
CPS IMPLANT KIT 410190 (MISCELLANEOUS) ×2 IMPLANT
ICD GALLANT HFCRTD CDHFA500Q (ICD Generator) ×2 IMPLANT
KIT ESSENTIALS PG (KITS) ×2 IMPLANT
LEAD DURATA 7122Q-65CM (Lead) ×2 IMPLANT
LEAD QUARTET 1458QL-86 (Lead) ×1 IMPLANT
LEAD SELECT SECURE 3830 383069 (Lead) ×1 IMPLANT
MAT PREVALON FULL STRYKER (MISCELLANEOUS) ×2 IMPLANT
PAD PRO RADIOLUCENT 2001M-C (PAD) ×2 IMPLANT
QUARTET 1458QL-86 (Lead) ×2 IMPLANT
SELECT SECURE 3830 383069 (Lead) ×2 IMPLANT
SHEATH 7FR PRELUDE SNAP 13 (SHEATH) ×2 IMPLANT
SHEATH 8FR PRELUDE SNAP 13 (SHEATH) ×2 IMPLANT
SLITTER 6232ADJ (MISCELLANEOUS) ×2 IMPLANT
SLITTER UNIVERSAL DS2A003 (MISCELLANEOUS) ×2 IMPLANT
TRAY PACEMAKER INSERTION (PACKS) ×2 IMPLANT
WIRE ACUITY WHISPER EDS 4648 (WIRE) ×2 IMPLANT
WIRE ASAHI SION 190X3X12 .014 (WIRE) ×4 IMPLANT
WIRE HI TORQ VERSACORE-J 145CM (WIRE) ×2 IMPLANT

## 2021-04-17 NOTE — H&P (Signed)
I have seen Dennis Cortez is a 55 y.o. male in the office today who has been referred by Dr. Elease Hashimoto for consideration of ICD implant for primary prevention of sudden death.  The patient's chart has been reviewed and they meet criteria for ICD implant.  I have had a thorough discussion with the patient reviewing options.  The patient and their family (if available) have had opportunities to ask questions and have them answered. The patient and I have decided together through a shared decision making process to proceed with the ICD at this time.  Risks, benefits, alternatives to ICD implantation were discussed in detail with the patient today. The patient  understands that the risks include but are not limited to bleeding, infection, pneumothorax, perforation, tamponade, vascular damage, renal failure, MI, stroke, death, inappropriate shocks, and lead dislodgement and he wishes to proceed.  We will therefore schedule device implantation at the next available time.

## 2021-04-17 NOTE — Progress Notes (Signed)
Discharge instructions reviewed with pt and his wife (via telephone) both voice understanding.  

## 2021-04-17 NOTE — Progress Notes (Signed)
Dr. Taylor in to see pt. 

## 2021-04-17 NOTE — Discharge Instructions (Signed)
    Supplemental Discharge Instructions for  Pacemaker/Defibrillator Patients  Tomorrow, 04/18/21, send in a device transmission  Activity No heavy lifting or vigorous activity with your left/right arm for 6 to 8 weeks.  Do not raise your left/right arm above your head for one week.  Gradually raise your affected arm as drawn below.              04/25/21                    04/26/21                    04/27/21                  04/28/21      NO DRIVING until cleared to at your wound check visit  WOUND CARE - Keep the wound area clean and dry.  Do not get this area wet , one week, you may drive 7/40/81 - Tomorrow, 04/18/21, remove the arm sling - Tomorrow, 04/18/21 remove the large outer plastic bandage.  Underneath the plastic bandage there are steri strips (paper tapes), DO NOT remove these. - The tape/steri-strips on your wound will fall off; do not pull them off.  No bandage is needed on the site.  DO  NOT apply any creams, oils, or ointments to the wound area. - If you notice any drainage or discharge from the wound, any swelling or bruising at the site, or you develop a fever > 101? F after you are discharged home, call the office at once.  Special Instructions - You are still able to use cellular telephones; use the ear opposite the side where you have your pacemaker/defibrillator.  Avoid carrying your cellular phone near your device. - When traveling through airports, show security personnel your identification card to avoid being screened in the metal detectors.  Ask the security personnel to use the hand wand. - Avoid arc welding equipment, MRI testing (magnetic resonance imaging), TENS units (transcutaneous nerve stimulators).  Call the office for questions about other devices. - Avoid electrical appliances that are in poor condition or are not properly grounded. - Microwave ovens are safe to be near or to operate.  Additional information for defibrillator patients should your device  go off: - If your device goes off ONCE and you feel fine afterward, notify the device clinic nurses. - If your device goes off ONCE and you do not feel well afterward, call 911. - If your device goes off TWICE, call 911. - If your device goes off THREE times in one day, call 911.  DO NOT DRIVE YOURSELF OR A FAMILY MEMBER WITH A DEFIBRILLATOR TO THE HOSPITAL--CALL 911.

## 2021-04-17 NOTE — H&P (Signed)
HPI [name] is referred by Dr. Elease Hashimoto for evaluation a non-ischemic CM, LBBB and chronic atrial fib. He has class 2 CHF. He has noted gradually worsening fatigue and dyspnea with exertion. He has not had syncope. He has had progressive LV dysfunction and EF is now 30-35%. He has a QRS duration of 160 ms.  No Known Allergies         Current Outpatient Medications  Medication Sig Dispense Refill  . allopurinol (ZYLOPRIM) 100 MG tablet TAKE 1 TABLET BY MOUTH EVERY DAY 90 tablet 0  . colchicine 0.6 MG tablet TAKE 2 TABS AT FIRST FIGN OF GOUT, MAY REPEAT 1 TAB IN AN HOUR. TAKE DAILY AFTER FOR UP TO 10 DAYS 30 tablet 1  . ezetimibe (ZETIA) 10 MG tablet Take 1 tablet (10 mg total) by mouth daily. 90 tablet 3  . fish oil-omega-3 fatty acids 1000 MG capsule Take 2 g by mouth daily.    Marland Kitchen losartan (COZAAR) 25 MG tablet Take 1 tablet (25 mg total) by mouth daily. Please call to schedule appointment for future refills. 1st attempt 90 tablet 3  . Multiple Vitamin (MULTIVITAMIN) tablet Take 1 tablet by mouth daily.    . ondansetron (ZOFRAN) 4 MG tablet Take 1 tablet (4 mg total) by mouth every 8 (eight) hours as needed for nausea or vomiting. 20 tablet 0  . rosuvastatin (CRESTOR) 20 MG tablet TAKE 1 TABLET BY MOUTH EVERY DAY 90 tablet 1  . venlafaxine XR (EFFEXOR-XR) 75 MG 24 hr capsule TAKE 1 CAPSULE BY MOUTH EVERY DAY 90 capsule 1  . XARELTO 20 MG TABS tablet TAKE 1 TABLET BY MOUTH EVERY DAY WITH SUPPER 90 tablet 2   No current facility-administered medications for this visit.         Past Medical History:  Diagnosis Date  . Atrial fibrillation (HCC)   . Cardioembolic stroke (HCC) 05/19/2015  . Chronic anticoagulation 09/15/2015  . COVID   . Esophageal reflux 09/15/2015  . Generalized anxiety disorder 05/19/2015  . H/O exercise stress test 02/2013   a. ETT-Echo with normal images but significant ST depression and hypotensive response to exercise  . Hx of cardiac  catheterization    LHC 03/09/13:  Smooth and normal coronary arteries, normal LVF  . Hyperlipemia 10/22/2007   Qualifier: Diagnosis of  By: Alphonzo Severance MD, Loni Dolly   . Hyperlipidemia   . LVH (left ventricular hypertrophy)    a. Echo 02/2013:  Mild LVH, EF 55-65%, normal wall motion, mild LAE, mild RVE, mild RAE   . Migraine without aura and without status migrainosus, not intractable 05/19/2015  . MVP (mitral valve prolapse)     ROS:   All systems reviewed and negative except as noted in the HPI.        Past Surgical History:  Procedure Laterality Date  . KNEE SURGERY    . US ECHOCARDIOGRAPHY  09/01/2008   EF 55-60%  . US ECHOCARDIOGRAPHY  08/17/2005   EF 55-60%     Family History  Adopted: Yes     Social History        Socioeconomic History  . Marital status: Married    Spouse name: Not on file  . Number of children: 0  . Years of education: Not on file  . Highest education level: Associate degree: academic program  Occupational History  . Not on file  Tobacco Use  . Smoking status: Never Smoker  . Smokeless tobacco: Never Used  Alcohol/week: 0.0 standard drinks    Comment: 2 beers per day  . Drug use: No  . Sexual activity: Yes    Partners: Female  Other Topics Concern  . Not on file  Social History Narrative  . Not on file   Social Determinants of Health   Financial Resource Strain: Not on file  Food Insecurity: Not on file  Transportation Needs: Not on file  Physical Activity: Not on file  Stress: Not on file  Social Connections: Not on file  Intimate Partner Violence: Not on file     BP 118/78   Pulse 80   Ht 6' 2" (1.88 m)   Wt 216 lb (98 kg)   BMI 27.73 kg/m   Physical Exam:  Well appearing NAD HEENT: Unremarkable Neck:  No JVD, no thyromegally Lymphatics:  No adenopathy Back:  No CVA tenderness Lungs:  Clear with no wheezes HEART:  IRegular rate rhythm, no murmurs, no rubs, no  clicks Abd:  soft, positive bowel sounds, no organomegally, no rebound, no guarding Ext:  2 plus pulses, no edema, no cyanosis, no clubbing Skin:  No rashes no nodules Neuro:  CN II through XII intact, motor grossly intact  EKG Atrial fib with LBBB  Assess/Plan: 1. Chronic systolic heart failure - he is on maximal medical therapy. He could not take a beta blocker due to bradycardia. I have discussed the treatment options with the patient and the indications/risks/benefits/goals/expectations of BIV ICD vs BIV PPM insertion were discussed and he wishes to proceed. He prefers biv ICD and I agree. 2. Atrial fib - his VR is controlled. He does not have RVR. His atrial fib is chronic and we will not attempt to try and get him back to rhythm after being out of rhythm for over 20 years. His rate appears to be controlled.  Meghanne Pletz,MD 

## 2021-04-18 ENCOUNTER — Encounter (HOSPITAL_COMMUNITY): Payer: Self-pay | Admitting: Internal Medicine

## 2021-04-18 ENCOUNTER — Telehealth: Payer: Self-pay

## 2021-04-18 NOTE — Telephone Encounter (Signed)
Follow-up after same day discharge: Implant date: 04/17/2021 MD: Lewayne Bunting, MD Device: Meryl Dare HF Location: Left Chest   Wound check visit: 04/27/21 90 day MD follow-up: 07/21/21  Remote Transmission received: TBD (patient will need to send manual)  Dressing removed: TBD  Attempted to contact patient about same day d/c. No answer, LMOVM.

## 2021-04-18 NOTE — Telephone Encounter (Signed)
-----   Message from [facility], [place] sent at 04/17/2021  4:25 PM EDT ----- Same day d/c  SJM CRT-D  Left bundle lead is in A port and RV apical/defib lead is in RV port  GT

## 2021-04-19 ENCOUNTER — Encounter (HOSPITAL_COMMUNITY): Payer: Self-pay | Admitting: Internal Medicine

## 2021-04-19 NOTE — Telephone Encounter (Signed)
Spoke with Patient.  He has removed outer bandage.  He reports no concerns with incision, steri strips are dry and intact.    His pain level is tolerable, managed with tylenol as directed.   Reviewed activity restrictions

## 2021-04-24 ENCOUNTER — Ambulatory Visit: Payer: BC Managed Care – PPO | Admitting: Neurology

## 2021-04-24 ENCOUNTER — Other Ambulatory Visit: Payer: BC Managed Care – PPO

## 2021-04-27 ENCOUNTER — Other Ambulatory Visit: Payer: Self-pay

## 2021-04-27 ENCOUNTER — Ambulatory Visit (INDEPENDENT_AMBULATORY_CARE_PROVIDER_SITE_OTHER): Payer: BC Managed Care – PPO | Admitting: Emergency Medicine

## 2021-04-27 DIAGNOSIS — I447 Left bundle-branch block, unspecified: Secondary | ICD-10-CM

## 2021-04-27 DIAGNOSIS — I255 Ischemic cardiomyopathy: Secondary | ICD-10-CM | POA: Diagnosis not present

## 2021-04-27 LAB — CUP PACEART INCLINIC DEVICE CHECK
Battery Remaining Longevity: 90 mo
Brady Statistic RA Percent Paced: 67 %
Brady Statistic RV Percent Paced: 0 %
Date Time Interrogation Session: 20220519095936
HighPow Impedance: 65.25 Ohm
Implantable Lead Implant Date: 20220509
Implantable Lead Implant Date: 20220509
Implantable Lead Implant Date: 20220509
Implantable Lead Location: 753858
Implantable Lead Location: 753860
Implantable Lead Location: 753860
Implantable Lead Model: 3830
Implantable Pulse Generator Implant Date: 20220509
Lead Channel Impedance Value: 612.5 Ohm
Lead Channel Impedance Value: 625 Ohm
Lead Channel Impedance Value: 662.5 Ohm
Lead Channel Pacing Threshold Amplitude: 0.5 V
Lead Channel Pacing Threshold Amplitude: 0.75 V
Lead Channel Pacing Threshold Amplitude: 2.25 V
Lead Channel Pacing Threshold Pulse Width: 0.5 ms
Lead Channel Pacing Threshold Pulse Width: 0.5 ms
Lead Channel Pacing Threshold Pulse Width: 0.5 ms
Lead Channel Sensing Intrinsic Amplitude: 12 mV
Lead Channel Sensing Intrinsic Amplitude: 5 mV
Lead Channel Setting Pacing Amplitude: 3.5 V
Lead Channel Setting Sensing Sensitivity: 0.5 mV
Pulse Gen Serial Number: 810027397

## 2021-04-27 NOTE — Progress Notes (Signed)
Wound check appointment. Steri-strips removed. Wound without redness or edema. Incision edges approximated, wound well healed. Normal device function. RA lead is in LBB, Device programmed AAI.  Thresholds, sensing, and impedances consistent with implant measurements. Device programmed at 3.5V for extra safety margin until 3 month visit. Histogram distribution appropriate for patient and level of activity. No mode switches or ventricular arrhythmias noted. Patient educated about wound care, arm mobility, lifting restrictions, shock plan.  Patient is enrolled in remote monitoring, next scheduled check 07/18/21. ROV with Dr. Ladona Ridgel on 07/21/21.

## 2021-05-02 ENCOUNTER — Telehealth: Payer: Self-pay

## 2021-05-02 NOTE — Telephone Encounter (Signed)
Poke with pt, advised of Dr. Ladona Ridgel instruction that he may return to work.  Continue to follow restrictions provided regarding to no more than 10 lbs with affected arm.

## 2021-05-02 NOTE — Telephone Encounter (Signed)
-----   Message from Marinus Maw, MD sent at 04/30/2021  3:12 PM EDT ----- yes ----- Message ----- From: Linton Ham, RN Sent: 04/27/2021  10:05 AM EDT To: Marinus Maw, MD  Dr. Ladona Ridgel, patient would like to know if he can return to work.  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  He has a meeting in [place] in June that he will have to travel for.

## 2021-05-03 ENCOUNTER — Other Ambulatory Visit: Payer: Self-pay | Admitting: Neurology

## 2021-05-03 ENCOUNTER — Other Ambulatory Visit: Payer: Self-pay | Admitting: Family Medicine

## 2021-06-16 ENCOUNTER — Other Ambulatory Visit: Payer: Self-pay | Admitting: Family Medicine

## 2021-06-30 ENCOUNTER — Encounter: Payer: BC Managed Care – PPO | Admitting: Internal Medicine

## 2021-07-18 ENCOUNTER — Ambulatory Visit (INDEPENDENT_AMBULATORY_CARE_PROVIDER_SITE_OTHER): Payer: BC Managed Care – PPO

## 2021-07-18 DIAGNOSIS — I255 Ischemic cardiomyopathy: Secondary | ICD-10-CM | POA: Diagnosis not present

## 2021-07-18 LAB — CUP PACEART REMOTE DEVICE CHECK
Battery Remaining Longevity: 88 mo
Battery Remaining Percentage: 94 %
Battery Voltage: 3.02 V
Brady Statistic RA Percent Paced: 48 %
Date Time Interrogation Session: 20220809020257
HighPow Impedance: 78 Ohm
Implantable Lead Implant Date: 20220509
Implantable Lead Implant Date: 20220509
Implantable Lead Implant Date: 20220509
Implantable Lead Location: 753858
Implantable Lead Location: 753860
Implantable Lead Location: 753860
Implantable Lead Model: 3830
Implantable Pulse Generator Implant Date: 20220509
Lead Channel Impedance Value: 580 Ohm
Lead Channel Impedance Value: 610 Ohm
Lead Channel Pacing Threshold Amplitude: 0.75 V
Lead Channel Pacing Threshold Pulse Width: 0.5 ms
Lead Channel Sensing Intrinsic Amplitude: 12 mV
Lead Channel Sensing Intrinsic Amplitude: 5 mV
Lead Channel Setting Pacing Amplitude: 3.5 V
Lead Channel Setting Sensing Sensitivity: 0.5 mV
Pulse Gen Serial Number: 810027397

## 2021-07-21 ENCOUNTER — Other Ambulatory Visit: Payer: Self-pay

## 2021-07-21 ENCOUNTER — Ambulatory Visit: Payer: BC Managed Care – PPO | Admitting: Internal Medicine

## 2021-07-21 VITALS — BP 116/74 | HR 70 | Ht 74.0 in | Wt 216.2 lb

## 2021-07-21 DIAGNOSIS — I255 Ischemic cardiomyopathy: Secondary | ICD-10-CM | POA: Diagnosis not present

## 2021-07-21 DIAGNOSIS — I5022 Chronic systolic (congestive) heart failure: Secondary | ICD-10-CM

## 2021-07-21 DIAGNOSIS — I447 Left bundle-branch block, unspecified: Secondary | ICD-10-CM | POA: Diagnosis not present

## 2021-07-21 DIAGNOSIS — Z95810 Presence of automatic (implantable) cardiac defibrillator: Secondary | ICD-10-CM

## 2021-07-21 NOTE — Progress Notes (Signed)
HPI Dennis Cortez returns today for evaluation a non-ischemic CM, LBBB and chronic atrial fib s/p biv ICD insertion. He had a left bundle area lead placed in the RA port of his device. He had class 2 CHF. He has improved nicely since his procedure. He has not had syncope. He has had progressive LV dysfunction and EF is now 30-35%. His QRS duration has decreased from 160 to 130 ms.  No Known Allergies   Current Outpatient Medications  Medication Sig Dispense Refill   acetaminophen (TYLENOL) 500 MG tablet Take 500 mg by mouth daily as needed (pain.).     allopurinol (ZYLOPRIM) 100 MG tablet TAKE 1 TABLET BY MOUTH EVERY DAY 90 tablet 0   colchicine 0.6 MG tablet TAKE 2 TABS AT FIRST FIGN OF GOUT, MAY REPEAT 1 TAB IN AN HOUR. TAKE DAILY AFTER FOR UP TO 10 DAYS 30 tablet 1   fish oil-omega-3 fatty acids 1000 MG capsule Take 1 g by mouth in the morning.     losartan (COZAAR) 25 MG tablet Take 1 tablet (25 mg total) by mouth daily. Please call to schedule appointment for future refills. 1st attempt 90 tablet 3   Multiple Vitamin (MULTIVITAMIN) tablet Take 1 tablet by mouth in the morning.     rosuvastatin (CRESTOR) 20 MG tablet TAKE 1 TABLET BY MOUTH EVERY DAY 90 tablet 1   venlafaxine XR (EFFEXOR-XR) 75 MG 24 hr capsule TAKE 1 CAPSULE BY MOUTH EVERY DAY 90 capsule 1   XARELTO 20 MG TABS tablet TAKE 1 TABLET BY MOUTH EVERY DAY WITH SUPPER 90 tablet 2   ezetimibe (ZETIA) 10 MG tablet Take 1 tablet (10 mg total) by mouth daily. (Patient taking differently: Take 10 mg by mouth in the morning.) 90 tablet 3   No current facility-administered medications for this visit.     Past Medical History:  Diagnosis Date   Atrial fibrillation (HCC)    Cardioembolic stroke (HCC) 05/19/2015   Chronic anticoagulation 09/15/2015   COVID    Esophageal reflux 09/15/2015   Generalized anxiety disorder 05/19/2015   H/O exercise stress test 02/2013   a. ETT-Echo with normal images but significant ST depression and  hypotensive response to exercise   Hx of cardiac catheterization    LHC 03/09/13:  Smooth and normal coronary arteries, normal LVF   Hyperlipemia 10/22/2007   Qualifier: Diagnosis of  By: Alphonzo Severance MD, Loni Dolly    Hyperlipidemia    LVH (left ventricular hypertrophy)    a. Echo 02/2013:  Mild LVH, EF 55-65%, normal wall motion, mild LAE, mild RVE, mild RAE    Migraine without aura and without status migrainosus, not intractable 05/19/2015   MVP (mitral valve prolapse)     ROS:   All systems reviewed and negative except as noted in the HPI.   Past Surgical History:  Procedure Laterality Date   BIV ICD INSERTION CRT-D N/A 04/17/2021   Procedure: BIV ICD INSERTION CRT-D;  Surgeon: Marinus Maw, MD;  Location: [facility] INVASIVE CV LAB;  Service: Cardiovascular;  Laterality: N/A;   KNEE SURGERY     US ECHOCARDIOGRAPHY  09/01/2008   EF 55-60%   US ECHOCARDIOGRAPHY  08/17/2005   EF 55-60%     Family History  Adopted: Yes     Social History   Socioeconomic History   Marital status: Married    Spouse name: Not on file   Number of children: 0   Years of education: Not on file  Highest education level: Associate degree: academic program  Occupational History   Not on file  Tobacco Use   Smoking status: Never   Smokeless tobacco: Never  Vaping Use   Vaping Use: Never used  Substance and Sexual Activity   Alcohol use: Yes    Alcohol/week: 0.0 standard drinks    Comment: 2 beers per day   Drug use: No   Sexual activity: Yes    Partners: Female  Other Topics Concern   Not on file  Social History Narrative   Not on file   Social Determinants of Health   Financial Resource Strain: Not on file  Food Insecurity: Not on file  Transportation Needs: Not on file  Physical Activity: Not on file  Stress: Not on file  Social Connections: Not on file  Intimate Partner Violence: Not on file     BP 116/74   Pulse 70   Ht 6\' 2"  (1.88 m)   Wt 216 lb 3.2 oz (98.1 kg)   SpO2 97%    BMI 27.76 kg/m   Physical Exam:  Well appearing NAD HEENT: Unremarkable Neck:  No JVD, no thyromegally Lymphatics:  No adenopathy Back:  No CVA tenderness Lungs:  Clear with no wheezes HEART:  Regular rate rhythm, no murmurs, no rubs, no clicks Abd:  soft, positive bowel sounds, no organomegally, no rebound, no guarding Ext:  2 plus pulses, no edema, no cyanosis, no clubbing Skin:  No rashes no nodules Neuro:  CN II through XII intact, motor grossly intact  EKG - atrial fib with ventricular pacing  DEVICE  Normal device function.  See PaceArt for details.   Assess/Plan:  ICD - his [employer]. Jude Biv ICD is working normally. He is pacing in the AAI mode with pacing from the left bundle area and a QRS which reduced from 160 to 130.  Chronic systolic heart failure - his symptoms appear to be class 1.  Atrial fib - he had a very few RVR's. I encouraged him to increase his activity but not too fast.  Coags -he has not had any bleeding on Xarelto. Continue  Jahyra Sukup,MD

## 2021-07-21 NOTE — Patient Instructions (Signed)
Medication Instructions:  Your physician recommends that you continue on your current medications as directed. Please refer to the Current Medication list given to you today.  Labwork: None ordered.  Testing/Procedures: None ordered.  Follow-Up: Your physician wants you to follow-up in: one year with Gregg Taylor, MD or one of the following Advanced Practice Providers on your designated Care Team:   Renee Ursuy, PA-C Michael "Andy" Tillery, PA-C  Remote monitoring is used to monitor your ICD from home. This monitoring reduces the number of office visits required to check your device to one time per year. It allows us to keep an eye on the functioning of your device to ensure it is working properly. You are scheduled for a device check from home on 10/17/2021. You may send your transmission at any time that day. If you have a wireless device, the transmission will be sent automatically. After your physician reviews your transmission, you will receive a postcard with your next transmission date.  Any Other Special Instructions Will Be Listed Below (If Applicable).  If you need a refill on your cardiac medications before your next appointment, please call your pharmacy.      

## 2021-08-10 NOTE — Progress Notes (Signed)
Remote ICD transmission.   

## 2021-09-11 ENCOUNTER — Other Ambulatory Visit: Payer: Self-pay | Admitting: Family Medicine

## 2021-09-21 ENCOUNTER — Encounter: Payer: Self-pay | Admitting: Family Medicine

## 2021-09-21 ENCOUNTER — Other Ambulatory Visit: Payer: Self-pay

## 2021-09-21 ENCOUNTER — Ambulatory Visit (INDEPENDENT_AMBULATORY_CARE_PROVIDER_SITE_OTHER): Payer: BC Managed Care – PPO | Admitting: Family Medicine

## 2021-09-21 VITALS — BP 124/70 | HR 70 | Temp 98.3°F | Wt 211.8 lb

## 2021-09-21 DIAGNOSIS — Z8739 Personal history of other diseases of the musculoskeletal system and connective tissue: Secondary | ICD-10-CM

## 2021-09-21 NOTE — Progress Notes (Signed)
Subjective:    Patient ID: Dennis Cortez, male    DOB: Jun 25, 1966, 56 y.o.   MRN: [id number]  Chief Complaint  Patient presents with   Medication Refill    HPI Patient is a 55 year old male with pmh sig for A. fib, systolic CHF, LBBB, BiV ICD in place, h/o cardioembolic stroke, HLD, MVP who was seen today for follow-up.  Patient states he is doing very well overall.  Had BiV ICD placed 04/17/2021.  Since procedure notes increased energy and no longer noticing palpitations.  Patient eating healthier, cut down on red meat.  Also exercising.  Since making lifestyle changes patient has not had no gout flares.  Inquires about stopping allopurinol daily.  Past Medical History:  Diagnosis Date   Atrial fibrillation (HCC)    Cardioembolic stroke (HCC) 05/19/2015   Chronic anticoagulation 09/15/2015   COVID    Esophageal reflux 09/15/2015   Generalized anxiety disorder 05/19/2015   H/O exercise stress test 02/2013   a. ETT-Echo with normal images but significant ST depression and hypotensive response to exercise   Hx of cardiac catheterization    LHC 03/09/13:  Smooth and normal coronary arteries, normal LVF   Hyperlipemia 10/22/2007   Qualifier: Diagnosis of  By: Alphonzo Severance MD, Loni Dolly    Hyperlipidemia    LVH (left ventricular hypertrophy)    a. Echo 02/2013:  Mild LVH, EF 55-65%, normal wall motion, mild LAE, mild RVE, mild RAE    Migraine without aura and without status migrainosus, not intractable 05/19/2015   MVP (mitral valve prolapse)     No Known Allergies  ROS General: Denies fever, chills, night sweats, changes in weight, changes in appetite HEENT: Denies headaches, ear pain, changes in vision, rhinorrhea, sore throat CV: Denies CP, palpitations, SOB, orthopnea Pulm: Denies SOB, cough, wheezing GI: Denies abdominal pain, nausea, vomiting, diarrhea, constipation GU: Denies dysuria, hematuria, frequency Msk: Denies muscle cramps, joint pains Neuro: Denies weakness, numbness,  tingling Skin: Denies rashes, bruising Psych: Denies depression, anxiety, hallucinations    Objective:    Blood pressure 124/70, pulse 70, temperature 98.3 F (36.8 C), temperature source Oral, weight 211 lb 12.8 oz (96.1 kg), SpO2 98 %.  Gen. Pleasant, well-nourished, in no distress, normal affect   HEENT: [place]/AT, face symmetric, conjunctiva clear, no scleral icterus, PERRLA, EOMI, nares patent without drainage. Neck: No JVD, no thyromegaly, no carotid bruits Lungs: no accessory muscle use, CTAB, no wheezes or rales Cardiovascular: RRR, no m/r/g, no peripheral edema Musculoskeletal: No deformities, no cyanosis or clubbing, normal tone Neuro:  A&Ox3, CN II-XII intact, normal gait Skin:  Warm, no lesions/ rash   Wt Readings from Last 3 Encounters:  09/21/21 211 lb 12.8 oz (96.1 kg)  07/21/21 216 lb 3.2 oz (98.1 kg)  04/17/21 216 lb (98 kg)    Lab Results  Component Value Date   WBC 4.9 04/14/2021   HGB 14.4 04/14/2021   HCT 41.9 04/14/2021   PLT 190 04/14/2021   GLUCOSE 88 04/14/2021   CHOL 188 01/23/2021   TRIG 80 01/23/2021   HDL 73 01/23/2021   LDLCALC 100 (H) 01/23/2021   ALT 22 01/23/2021   AST 23 09/12/2016   NA 139 04/14/2021   K 4.4 04/14/2021   CL 102 04/14/2021   CREATININE 0.96 04/14/2021   BUN 11 04/14/2021   CO2 23 04/14/2021   TSH 2.39 02/17/2013   INR 1.0 03/04/2013   HGBA1C 5.1 09/18/2018    Assessment/Plan:  History of gout -given improvements in lifestyle  no recent gout flares -d/c allopurinol daily -Symptomatic treatment if needed. -For greater then for gout flares per year consider restarting allopurinol.  F/u prn  Abbe Amsterdam, MD

## 2021-10-17 ENCOUNTER — Ambulatory Visit (INDEPENDENT_AMBULATORY_CARE_PROVIDER_SITE_OTHER): Payer: BC Managed Care – PPO

## 2021-10-17 DIAGNOSIS — I447 Left bundle-branch block, unspecified: Secondary | ICD-10-CM | POA: Diagnosis not present

## 2021-10-17 LAB — CUP PACEART REMOTE DEVICE CHECK
Battery Remaining Longevity: 101 mo
Battery Remaining Percentage: 92 %
Battery Voltage: 3.01 V
Brady Statistic RA Percent Paced: 63 %
Date Time Interrogation Session: 20221108010257
HighPow Impedance: 77 Ohm
Implantable Lead Implant Date: 20220509
Implantable Lead Implant Date: 20220509
Implantable Lead Implant Date: 20220509
Implantable Lead Location: 753858
Implantable Lead Location: 753860
Implantable Lead Location: 753860
Implantable Lead Model: 3830
Implantable Pulse Generator Implant Date: 20220509
Lead Channel Impedance Value: 540 Ohm
Lead Channel Impedance Value: 610 Ohm
Lead Channel Pacing Threshold Amplitude: 0.5 V
Lead Channel Pacing Threshold Pulse Width: 0.5 ms
Lead Channel Sensing Intrinsic Amplitude: 12 mV
Lead Channel Sensing Intrinsic Amplitude: 5 mV
Lead Channel Setting Pacing Amplitude: 2.5 V
Lead Channel Setting Sensing Sensitivity: 0.5 mV
Pulse Gen Serial Number: 810027397

## 2021-10-25 ENCOUNTER — Other Ambulatory Visit: Payer: Self-pay | Admitting: Neurology

## 2021-10-25 NOTE — Progress Notes (Signed)
Remote ICD transmission.   

## 2021-11-09 NOTE — Progress Notes (Signed)
NEUROLOGY FOLLOW UP OFFICE NOTE  Dennis Cortez [id number]  Assessment/Plan:   Migraine without aura, without status migrainosus, not intractable, stable History of right occipital ischemic infarct, cryptogenic. May have been cardioembolic or migraine with persistent aura and infarct Atrial fibrillation Anxiety, stable  1  Migraine and anxiety management with venlafaxine XR 75mg  daily may be followed by PCP going forward 2  Secondary stroke prevention  to continue to be managed by PCP and cardiology  - Xarelto  - statin.  LDL goal less than 70  - normotensive blood pressure  - Hgb A1c goal less than 7 3  Follow up as needed  Subjective:  Dennis Cortez is a 55 year old left-handed male with atrial fibrillation (on Xarelto), MVP, hyperlipidemia, migraine and history of right occipital stroke and alcohol use who follows up for migraine.   UPDATE: No headaches over past year.  Anxiety controlled.  Status post PPM.  Feeling well. Migraines: Intensity:  mild Duration:  10-15 minutes Frequency:  less than one a month Frequency of abortive medication:  none Current NSAIDS:  no Current analgesics:  no Current triptans:  no Current ergotamine:  no Current anti-emetic: no Current muscle relaxants:  no Current anti-anxiolytic:  no Current sleep aide:  no Current Antihypertensive medications:  lisinopril 10mg  Current Antidepressant medications:  Venlafaxine XR 75mg  daily  Current Anticonvulsant medications:  no Current anti-CGRP:  no Current Vitamins/Herbal/Supplements:  fish oil Current Antihistamines/Decongestants:  no Other therapy:  no Other medication:  Xarelto, Crestor 20mg , allopurinol, colchicine     HISTORY: On 05/24/14, he had sudden onset of vision loss while driving.  He describes it as left homonymous hemianopsia with slanting up to the right.  The visual disturbance was monocular, involving each eye individually.  It lasted about 45 minutes and resolved.   Afterwards, he developed a right retro-orbital aching pain, lasting about a day.  There was no associated double vision, unilateral weakness, unilateral numbness, dizziness, or slurred speech.  MRI of the brain with and without contrast revealed a small acute right occipital infarct.  Carotid dopplers revealed no hemodynamically significant stenosis.  He was started on Xarelto.  Visual field cut resolved the next day.     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  At that time, he developed hemi-vision loss associated with wavy lines in only his left eye, and lasting 10 to 15 minutes.  It was not associated with headache.  He also has history of migraines, presenting as left retro-orbital headaches, 4/10, and sometimes associated with nausea and photophobia but not visual disturbance, vomiting, phonophobia or unilateral numbness or weakness.  They can last anywhere from minutes to several hours.  They initially occurred 3 to 4 days per week.  He has not had any visual symptoms associated with them.  There are no specific triggers or relieving factors.  He is unable to take ASA or NSAIDs as per cardiology.  He does report remote history of headaches as a child, in which he had to lay down with a rag over his eyes.  These were associated with nausea.  They resolved after age 4 or 36.  He is adopted so family history is unknown.  PAST MEDICAL HISTORY: Past Medical History:  Diagnosis Date   Atrial fibrillation (HCC)    Cardioembolic stroke (HCC) 05/19/2015   Chronic anticoagulation 09/15/2015   COVID    Esophageal reflux 09/15/2015   Generalized anxiety disorder 05/19/2015   H/O exercise stress  test 02/2013   a. ETT-Echo with normal images but significant ST depression and hypotensive response to exercise   Hx of cardiac catheterization    LHC 03/09/13:  Smooth and normal coronary arteries, normal LVF   Hyperlipemia 10/22/2007   Qualifier: Diagnosis of  By: Niel Hummer MD, Lorinda Creed    Hyperlipidemia    LVH (left ventricular hypertrophy)    a. Echo 02/2013:  Mild LVH, EF 55-65%, normal wall motion, mild LAE, mild RVE, mild RAE    Migraine without aura and without status migrainosus, not intractable 05/19/2015   MVP (mitral valve prolapse)     MEDICATIONS: Current Outpatient Medications on File Prior to Visit  Medication Sig Dispense Refill   acetaminophen (TYLENOL) 500 MG tablet Take 500 mg by mouth daily as needed (pain.).     allopurinol (ZYLOPRIM) 100 MG tablet TAKE 1 TABLET BY MOUTH EVERY DAY 90 tablet 0   colchicine 0.6 MG tablet TAKE 2 TABS AT FIRST FIGN OF GOUT, MAY REPEAT 1 TAB IN AN HOUR. TAKE DAILY AFTER FOR UP TO 10 DAYS 30 tablet 1   ezetimibe (ZETIA) 10 MG tablet Take 1 tablet (10 mg total) by mouth daily. (Patient taking differently: Take 10 mg by mouth in the morning.) 90 tablet 3   fish oil-omega-3 fatty acids 1000 MG capsule Take 1 g by mouth in the morning.     losartan (COZAAR) 25 MG tablet Take 1 tablet (25 mg total) by mouth daily. Please call to schedule appointment for future refills. 1st attempt 90 tablet 3   Multiple Vitamin (MULTIVITAMIN) tablet Take 1 tablet by mouth in the morning.     rosuvastatin (CRESTOR) 20 MG tablet TAKE 1 TABLET BY MOUTH EVERY DAY 90 tablet 1   venlafaxine XR (EFFEXOR-XR) 75 MG 24 hr capsule TAKE 1 CAPSULE BY MOUTH EVERY DAY 30 capsule 0   XARELTO 20 MG TABS tablet TAKE 1 TABLET BY MOUTH EVERY DAY WITH SUPPER 90 tablet 2   No current facility-administered medications on file prior to visit.    ALLERGIES: No Known Allergies  FAMILY HISTORY: Family History  Adopted: Yes      Objective:  Blood pressure 116/65, pulse 72, height 6\' 2"  (1.88 m), weight 213 lb 9.6 oz (96.9 kg), SpO2 100 %. General: No acute distress.  Patient appears well-groomed.   Head:  Normocephalic/atraumatic Eyes:  Fundi examined but not visualized Neck: supple, no paraspinal tenderness, full range of motion Heart:  Regular  rate and rhythm Lungs:  Clear to auscultation bilaterally Back: No paraspinal tenderness Neurological Exam: alert and oriented to person, place, and time.  Speech fluent and not dysarthric, language intact.  CN II-XII intact. Bulk and tone normal, muscle strength 5/5 throughout.  Sensation to light touch intact.  Deep tendon reflexes 2+ throughout, toes downgoing.  Finger to nose testing intact.  Gait normal, Romberg negative.   Metta Clines, DO  CC: Grier Mitts, MD

## 2021-11-10 ENCOUNTER — Ambulatory Visit: Payer: BC Managed Care – PPO | Admitting: Neurology

## 2021-11-10 ENCOUNTER — Other Ambulatory Visit: Payer: Self-pay

## 2021-11-10 ENCOUNTER — Encounter: Payer: Self-pay | Admitting: Neurology

## 2021-11-10 VITALS — BP 116/65 | HR 72 | Ht 74.0 in | Wt 213.6 lb

## 2021-11-10 DIAGNOSIS — F411 Generalized anxiety disorder: Secondary | ICD-10-CM

## 2021-11-10 DIAGNOSIS — G43009 Migraine without aura, not intractable, without status migrainosus: Secondary | ICD-10-CM

## 2021-11-10 DIAGNOSIS — I4891 Unspecified atrial fibrillation: Secondary | ICD-10-CM

## 2021-11-10 DIAGNOSIS — I63431 Cerebral infarction due to embolism of right posterior cerebral artery: Secondary | ICD-10-CM | POA: Diagnosis not present

## 2021-11-10 NOTE — Patient Instructions (Signed)
Continue venlafaxine.  GoinMediterranean Diet A Mediterranean diet refers to food and lifestyle choices that are based on the traditions of countries located on the [employer]. It focuses on eating more fruits, vegetables, whole grains, beans, nuts, seeds, and heart-healthy fats, and eating less dairy, meat, eggs, and processed foods with added sugar, salt, and fat. This way of eating has been shown to help prevent certain conditions and improve outcomes for people who have chronic diseases, like kidney disease and heart disease. What are tips for following this plan? Reading food labels Check the serving size of packaged foods. For foods such as rice and pasta, the serving size refers to the amount of cooked product, not dry. Check the total fat in packaged foods. Avoid foods that have saturated fat or trans fats. Check the ingredient list for added sugars, such as corn syrup. Shopping  Buy a variety of foods that offer a balanced diet, including: Fresh fruits and vegetables (produce). Grains, beans, nuts, and seeds. Some of these may be available in unpackaged forms or large amounts (in bulk). Fresh seafood. Poultry and eggs. Low-fat dairy products. Buy whole ingredients instead of prepackaged foods. Buy fresh fruits and vegetables in-season from local farmers markets. Buy plain frozen fruits and vegetables. If you do not have access to quality fresh seafood, buy precooked frozen shrimp or canned fish, such as tuna, salmon, or sardines. Stock your pantry so you always have certain foods on hand, such as olive oil, canned tuna, canned tomatoes, rice, pasta, and beans. Cooking Cook foods with extra-virgin olive oil instead of using butter or other vegetable oils. Have meat as a side dish, and have vegetables or grains as your main dish. This means having meat in small portions or adding small amounts of meat to foods like pasta or stew. Use beans or vegetables instead of meat in common  dishes like chili or lasagna. Experiment with different cooking methods. Try roasting, broiling, steaming, and sauting vegetables. Add frozen vegetables to soups, stews, pasta, or rice. Add nuts or seeds for added healthy fats and plant protein at each meal. You can add these to yogurt, salads, or vegetable dishes. Marinate fish or vegetables using olive oil, lemon juice, garlic, and fresh herbs. Meal planning Plan to eat one vegetarian meal one day each week. Try to work up to two vegetarian meals, if possible. Eat seafood two or more times a week. Have healthy snacks readily available, such as: Vegetable sticks with hummus. Greek yogurt. Fruit and nut trail mix. Eat balanced meals throughout the week. This includes: Fruit: 2-3 servings a day. Vegetables: 4-5 servings a day. Low-fat dairy: 2 servings a day. Fish, poultry, or lean meat: 1 serving a day. Beans and legumes: 2 or more servings a week. Nuts and seeds: 1-2 servings a day. Whole grains: 6-8 servings a day. Extra-virgin olive oil: 3-4 servings a day. Limit red meat and sweets to only a few servings a month. Lifestyle  Cook and eat meals together with your family, when possible. Drink enough fluid to keep your urine pale yellow. Be physically active every day. This includes: Aerobic exercise like running or swimming. Leisure activities like gardening, walking, or housework. Get 7-8 hours of sleep each night. If recommended by your health care provider, drink red wine in moderation. This means 1 glass a day for nonpregnant women and 2 glasses a day for men. A glass of wine equals 5 oz (150 mL). What foods should I eat? Fruits Apples. Apricots. Avocado. Berries.  Bananas. Cherries. Dates. Figs. Grapes. Lemons. Melon. Oranges. Peaches. Plums. Pomegranate. Vegetables Artichokes. Beets. Broccoli. Cabbage. Carrots. Eggplant. Green beans. Chard. Kale. Spinach. Onions. Leeks. Peas. Squash. Tomatoes. Peppers.  Radishes. Grains Whole-grain pasta. Brown rice. Bulgur wheat. Polenta. Couscous. Whole-wheat bread. Orpah Cobb. Meats and other proteins Beans. Almonds. Sunflower seeds. Pine nuts. Peanuts. Cod. Salmon. Scallops. Shrimp. Tuna. Tilapia. Clams. Oysters. Eggs. Poultry without skin. Dairy Low-fat milk. Cheese. Greek yogurt. Fats and oils Extra-virgin olive oil. Avocado oil. Grapeseed oil. Beverages Water. Red wine. Herbal tea. Sweets and desserts Greek yogurt with honey. Baked apples. Poached pears. Trail mix. Seasonings and condiments Basil. Cilantro. Coriander. Cumin. Mint. Parsley. Sage. Rosemary. Tarragon. Garlic. Oregano. Thyme. Pepper. Balsamic vinegar. Tahini. Hummus. Tomato sauce. Olives. Mushrooms. The items listed above may not be a complete list of foods and beverages you can eat. Contact a dietitian for more information. What foods should I limit? This is a list of foods that should be eaten rarely or only on special occasions. Fruits Fruit canned in syrup. Vegetables Deep-fried potatoes (french fries). Grains Prepackaged pasta or rice dishes. Prepackaged cereal with added sugar. Prepackaged snacks with added sugar. Meats and other proteins Beef. Pork. Lamb. Poultry with skin. Hot dogs. Tomasa Blase. Dairy Ice cream. Sour cream. Whole milk. Fats and oils Butter. Canola oil. Vegetable oil. Beef fat (tallow). Lard. Beverages Juice. Sugar-sweetened soft drinks. Beer. Liquor and spirits. Sweets and desserts Cookies. Cakes. Pies. Candy. Seasonings and condiments Mayonnaise. Pre-made sauces and marinades. The items listed above may not be a complete list of foods and beverages you should limit. Contact a dietitian for more information. Summary The Mediterranean diet includes both food and lifestyle choices. Eat a variety of fresh fruits and vegetables, beans, nuts, seeds, and whole grains. Limit the amount of red meat and sweets that you eat. If recommended by your health  care provider, drink red wine in moderation. This means 1 glass a day for nonpregnant women and 2 glasses a day for men. A glass of wine equals 5 oz (150 mL). This information is not intended to replace advice given to you by your health care provider. Make sure you discuss any questions you have with your health care provider. Document Revised: 01/01/2020 Document Reviewed: 10/29/2019 Elsevier Patient Education  2022 Else[employer] g forward, refills can be managed by your PCP Continue secondary stroke prevention as managed by your PCP and cardiology Mediterranean diet

## 2021-11-17 ENCOUNTER — Other Ambulatory Visit: Payer: Self-pay | Admitting: Neurology

## 2021-12-12 ENCOUNTER — Other Ambulatory Visit: Payer: Self-pay | Admitting: Cardiovascular Disease

## 2021-12-12 NOTE — Telephone Encounter (Signed)
Xarelto 20 mg refill request received. Pt is 56 years old, weight- 96.9 kg, Crea- 0.96 on 04/14/21, last seen by Dr. Ladona Ridgel on 07/21/21, Diagnosis-afib, CrCl- 117.4; Dose is appropriate based on dosing criteria. Will send in refill to requested pharmacy.

## 2022-01-16 ENCOUNTER — Ambulatory Visit (INDEPENDENT_AMBULATORY_CARE_PROVIDER_SITE_OTHER): Payer: BC Managed Care – PPO

## 2022-01-16 DIAGNOSIS — I428 Other cardiomyopathies: Secondary | ICD-10-CM | POA: Diagnosis not present

## 2022-01-16 LAB — CUP PACEART REMOTE DEVICE CHECK
Battery Remaining Longevity: 97 mo
Battery Remaining Percentage: 89 %
Battery Voltage: 3.01 V
Brady Statistic RA Percent Paced: 62 %
Date Time Interrogation Session: 20230207020436
HighPow Impedance: 77 Ohm
Implantable Lead Implant Date: 20220509
Implantable Lead Implant Date: 20220509
Implantable Lead Implant Date: 20220509
Implantable Lead Location: 753858
Implantable Lead Location: 753860
Implantable Lead Location: 753860
Implantable Lead Model: 3830
Implantable Pulse Generator Implant Date: 20220509
Lead Channel Impedance Value: 540 Ohm
Lead Channel Impedance Value: 600 Ohm
Lead Channel Pacing Threshold Amplitude: 0.5 V
Lead Channel Pacing Threshold Pulse Width: 0.5 ms
Lead Channel Sensing Intrinsic Amplitude: 12 mV
Lead Channel Sensing Intrinsic Amplitude: 5 mV
Lead Channel Setting Pacing Amplitude: 2.5 V
Lead Channel Setting Sensing Sensitivity: 0.5 mV
Pulse Gen Serial Number: 810027397

## 2022-01-19 NOTE — Progress Notes (Signed)
Remote ICD transmission.   

## 2022-01-20 ENCOUNTER — Other Ambulatory Visit: Payer: Self-pay | Admitting: Cardiovascular Disease

## 2022-02-09 ENCOUNTER — Other Ambulatory Visit: Payer: Self-pay | Admitting: Cardiovascular Disease

## 2022-02-14 ENCOUNTER — Telehealth: Payer: Self-pay | Admitting: Cardiovascular Disease

## 2022-02-14 MED ORDER — LOSARTAN POTASSIUM 25 MG PO TABS: 25.0000 mg | ORAL_TABLET | Freq: Every day | ORAL | 0 refills | Status: DC

## 2022-02-14 NOTE — Telephone Encounter (Signed)
?*  STAT* If patient is at the pharmacy, call can be transferred to refill team. ? ? ?1. Which medications need to be refilled? (please list name of each medication and dose if known) Losartan ? ?2. Which pharmacy/location (including street and city if local pharmacy) is medication to be sent to?  [facility] [address], ,[place] ? ?3. Do they need a 30 day or 90 day supply? Need enough until her appointment on 03-30-22 ? ?

## 2022-02-14 NOTE — Telephone Encounter (Signed)
Pt's medication was sent to pt's pharmacy as requested. Confirmation received.  °

## 2022-02-18 ENCOUNTER — Other Ambulatory Visit: Payer: Self-pay | Admitting: Cardiovascular Disease

## 2022-03-21 ENCOUNTER — Telehealth: Payer: Self-pay | Admitting: Family Medicine

## 2022-03-21 NOTE — Telephone Encounter (Signed)
FYI misty rn with [facility] is calling to let md know the pt said he no longer needs nurse management care at this time ?

## 2022-03-22 ENCOUNTER — Other Ambulatory Visit: Payer: Self-pay | Admitting: Cardiovascular Disease

## 2022-03-29 ENCOUNTER — Encounter: Payer: Self-pay | Admitting: Cardiovascular Disease

## 2022-03-29 NOTE — Progress Notes (Signed)
? ? ? ?Dennis Cortez ?Date of Birth  01/20/1966 ?      ?[place]     ?[place]. [address]   ?[place], [place]  [address]    ?[phone]       ?Fax  [phone]     ? ?Problem List: ?1. Atrial Fibrillation - TIA  ?2. Hyperlipidemia ? ? ?Dennis Cortez is doing well.  Asymptomatic.  His last echo was 3 years ago.  He is working out at [employer] on a regular basis.  He has never had any symptoms related to his A-Fib.  ?His CHADS score is 0 ? ?March 29, 2014: ? ?Dennis Cortez is doing well.  Not working out as much as he would like . Doing well from a cardiac standpoint.  No CP or dyspnea.    ? ?July 30, 2014: ?Dennis Cortez is seen today for followup of his atrial fibrillation. He's been maintained on aspirin because of his low chads Vasc score.  He presented several months after I saw him with some visual disturbances and headache  and was found stroke. He was started on Xarelto at that time.   ?He feels ok but is still having some headaches.   ? ?Sept. 19, 2016:   ? ?Doing well.  Has been walking.     ?No CP or dyspnea.   ? ?Oct. 4, 2017: ? ?Dennis Cortez is seen today for follow-up of his atrial fibrillation and LBBB. ?Tough year. ?His mother died this past year. ?managment changes at work  ? ?Nov. 21, 2018: ?Dennis Cortez is seen today for a follow-up of his chronic atrial fibrillation and left bundle branch block. ?Echocardiogram in March, 2014 showed normal left ventricular systolic function. ?Has had Afib for 20+ years.   We have not tried cardioversion.   We discussed the fact that cardioversion would likely not be successful ?Work has been stressful.  ? ?Nov. 22, 2019: ? ?Dennis Cortez is seen today for follow-up of his atrial fibrillation and left bundle branch block. ?Staying active .   Working out some .   ?Chol and LDL are elevated.    ?LDL was 122 on Crestor 10 mg a day  ? ?December 24, 2018: ?Is seen back today for a follow-up of his chronic atrial fibrillation left bundle branch block. ? ?Because of his chronic atrial fibrillation  and conduction abnormality, we performed a cardiac MRI following his last office visit.  His left ventricular systolic function is mildly depressed with an ejection fraction of 49%.  No evidence of scarring.  There is no suggestion of sarcoidosis. ? ?Feb. 14, 2022: ?Dennis Cortez is seen today for follow up visit.   Hx of Atrial fib, LBBB, mildly reduced LV EF .  ?Cardiac MRI showed no LV scarring , no sarcoidosis, no amyloidosis,  EF = 49%.  ?Its been 2 years since [place] seen him. ?[place] last year ?Had Covid in Feb. 2021    ( likely Delta or original strain )  ?Is trying to exercise  ? ? ?March 30, 2022 ?Dennis Cortez is seen today for follow up of his Atrial fib, LBBB, he had a cardiac MRI in the past.  Showed no LV scarring, no sarcoidosis, no amyloidosis.  Ejection fraction was 49%. ? ?Echocardiogram from May, 2022 showed marked depression of his left ventricular systolic function to 30 to 35%.  He was referred to Dr. Lovena Le for biventricular pacing/ICD. ? ?Had a pacer ICD placed in May, 2022 ?Playing golf,   ? ?More energy now .  Able to walk much better now.   ? ?Current Outpatient Medications on File Prior to Visit  ?Medication Sig Dispense Refill  ? acetaminophen (TYLENOL) 500 MG tablet Take 500 mg by mouth daily as needed (pain.).    ? colchicine 0.6 MG tablet TAKE 2 TABS AT FIRST FIGN OF GOUT, MAY REPEAT 1 TAB IN AN HOUR. TAKE DAILY AFTER FOR UP TO 10 DAYS 30 tablet 1  ? ezetimibe (ZETIA) 10 MG tablet TAKE 1 TABLET (10 MG TOTAL) BY MOUTH DAILY. PATIENT NEEDS TO KEEP APPOINTMENT IN APRIL FOR ANY FUTURE REFILLS. 30 tablet 0  ? fish oil-omega-3 fatty acids 1000 MG capsule Take 1 g by mouth in the morning.    ? losartan (COZAAR) 25 MG tablet Take 1 tablet (25 mg total) by mouth daily. Please keep upcoming appt with Dr. Acie Fredrickson in April 2023 before anymore refills. Thank you Final attempt 90 tablet 0  ? Multiple Vitamin (MULTIVITAMIN) tablet Take 1 tablet by mouth in the morning.    ? rivaroxaban (XARELTO) 20 MG TABS tablet  TAKE 1 TABLET BY MOUTH EVERY DAY WITH SUPPER 90 tablet 1  ? venlafaxine XR (EFFEXOR-XR) 75 MG 24 hr capsule TAKE 1 CAPSULE BY MOUTH EVERY DAY 90 capsule 1  ? allopurinol (ZYLOPRIM) 100 MG tablet TAKE 1 TABLET BY MOUTH EVERY DAY (Patient not taking: Reported on 03/30/2022) 90 tablet 0  ? rosuvastatin (CRESTOR) 20 MG tablet TAKE 1 TABLET BY MOUTH EVERY DAY (Patient not taking: Reported on 03/30/2022) 90 tablet 1  ? ?No current facility-administered medications on file prior to visit.  ? ? ?No Known Allergies ? ?Past Medical History:  ?Diagnosis Date  ? Atrial fibrillation ([place])   ? Cardioembolic stroke ([place]) [id number]  ? Chronic anticoagulation 09/15/2015  ? COVID   ? Esophageal reflux 09/15/2015  ? Generalized anxiety disorder 05/19/2015  ? H/O exercise stress test 02/2013  ? a. ETT-Echo with normal images but significant ST depression and hypotensive response to exercise  ? Hx of cardiac catheterization   ? LHC 03/09/13:  Smooth and normal coronary arteries, normal LVF  ? Hyperlipemia 10/22/2007  ? Qualifier: Diagnosis of  By: Niel Hummer MD, Lorinda Creed   ? Hyperlipidemia   ? LVH (left ventricular hypertrophy)   ? a. Echo 02/2013:  Mild LVH, EF 55-65%, normal wall motion, mild LAE, mild RVE, mild RAE   ? Migraine without aura and without status migrainosus, not intractable 05/19/2015  ? MVP (mitral valve prolapse)   ? ? ?Past Surgical History:  ?Procedure Laterality Date  ? BIV ICD INSERTION CRT-D N/A 04/17/2021  ? Procedure: BIV ICD INSERTION CRT-D;  Surgeon: Evans Lance, MD;  Location: [facility];  Service: Cardiovascular;  Laterality: N/A;  ? KNEE SURGERY    ? US ECHOCARDIOGRAPHY  09/01/2008  ? EF 55-60%  ? US ECHOCARDIOGRAPHY  08/17/2005  ? EF 55-60%  ? ? ?Social History  ? ?Tobacco Use  ?Smoking Status Never  ?Smokeless Tobacco Never  ? ? ?Social History  ? ?Substance and Sexual Activity  ?Alcohol Use Yes  ? Alcohol/week: 0.0 standard drinks  ? Comment: 2 beers per day  ? ? ?Family History  ?Adopted: Yes  ? ? ?Reviw  of Systems:  ?Reviewed in the HPI.  All other systems are negative. ? ?Physical Exam: ?Blood pressure 118/76, pulse 69, height 6\' 2"  (1.88 m), weight 216 lb 3.2 oz (98.1 kg), SpO2 97 %. ? ?GEN:  Well nourished, well developed in no acute distress ?HEENT:  Normal ?NECK: No JVD; No carotid bruits ?LYMPHATICS: No lymphadenopathy ?CARDIAC: RRR   ?RESPIRATORY:  Clear to auscultation without rales, wheezing or rhonchi  ?ABDOMEN: Soft, non-tender, non-distended ?MUSCULOSKELETAL:  No edema; No deformity  ?SKIN: Warm and dry ?NEUROLOGIC:  Alert and oriented x 3 ? ? ? ?ECG:     ? ?Assessment / Plan:  ? ?1. Atrial Fibrillation  -  stable, now has a pacer ,   doing great ?On xarelto. ?Check CBC today  ? ? ?2. Hyperlipidemia - . On zetia,  check lipids, ALT, BMp today  ? ?3. CVA:    .   ? ? ?4.  Mildly reduced left ventricular systolic function.   ?S/p Bi-V pacer  ? ? ? ? ? ?Mertie Moores, MD  ?03/30/2022 9:41 AM    ?Dennis Cortez ?[address],  [address] ?[place], [place]  [address] ?Pager [phone] ?Phone: [phone]; Fax: [phone]  ? ? ? ?

## 2022-03-30 ENCOUNTER — Ambulatory Visit: Payer: BC Managed Care – PPO | Admitting: Cardiovascular Disease

## 2022-03-30 ENCOUNTER — Encounter: Payer: Self-pay | Admitting: Cardiovascular Disease

## 2022-03-30 VITALS — BP 118/76 | HR 69 | Ht 74.0 in | Wt 216.2 lb

## 2022-03-30 DIAGNOSIS — I482 Chronic atrial fibrillation, unspecified: Secondary | ICD-10-CM

## 2022-03-30 DIAGNOSIS — E785 Hyperlipidemia, unspecified: Secondary | ICD-10-CM

## 2022-03-30 LAB — LIPID PANEL
Chol/HDL Ratio: 2.9 ratio (ref 0.0–5.0)
Cholesterol, Total: 225 mg/dL — ABNORMAL HIGH (ref 100–199)
HDL: 77 mg/dL (ref >39)
LDL Chol Calc (NIH): 137 mg/dL — ABNORMAL HIGH (ref 0–99)
Triglycerides: 62 mg/dL (ref 0–149)
VLDL Cholesterol Cal: 11 mg/dL (ref 5–40)

## 2022-03-30 LAB — BASIC METABOLIC PANEL
BUN/Creatinine Ratio: 11 (ref 9–20)
BUN: 12 mg/dL (ref 6–24)
CO2: 26 mmol/L (ref 20–29)
Calcium: 9.7 mg/dL (ref 8.7–10.2)
Chloride: 102 mmol/L (ref 96–106)
Creatinine, Ser: 1.06 mg/dL (ref 0.76–1.27)
Glucose: 100 mg/dL — ABNORMAL HIGH (ref 70–99)
Potassium: 4.7 mmol/L (ref 3.5–5.2)
Sodium: 139 mmol/L (ref 134–144)
eGFR: 82 mL/min/{1.73_m2} (ref >59)

## 2022-03-30 LAB — CBC
Hematocrit: 40.8 % (ref 37.5–51.0)
Hemoglobin: 14.7 g/dL (ref 13.0–17.7)
MCH: 33.6 pg — ABNORMAL HIGH (ref 26.6–33.0)
MCHC: 36 g/dL — ABNORMAL HIGH (ref 31.5–35.7)
MCV: 93 fL (ref 79–97)
Platelets: 180 10*3/uL (ref 150–450)
RBC: 4.38 x10E6/uL (ref 4.14–5.80)
RDW: 12.1 % (ref 11.6–15.4)
WBC: 4.3 10*3/uL (ref 3.4–10.8)

## 2022-03-30 LAB — ALT: ALT: 23 IU/L (ref 0–44)

## 2022-03-30 MED ORDER — LOSARTAN POTASSIUM 25 MG PO TABS: 25.0000 mg | ORAL_TABLET | Freq: Every day | ORAL | 3 refills | Status: DC

## 2022-03-30 NOTE — Patient Instructions (Signed)
Medication Instructions:  ?Your physician recommends that you continue on your current medications as directed. Please refer to the Current Medication list given to you today. ? ?*If you need a refill on your cardiac medications before your next appointment, please call your pharmacy* ? ?Lab Work: ?TODAY: CBC, Lipids, ALT, BMP ?If you have labs (blood work) drawn today and your tests are completely normal, you will receive your results only by: ?MyChart Message (if you have MyChart) OR ?A paper copy in the mail ?If you have any lab test that is abnormal or we need to change your treatment, we will call you to review the results. ? ?Testing/Procedures: ?NONE ? ?Follow-Up: ?At [facility], you and your health needs are our priority.  As part of our continuing mission to provide you with exceptional heart care, we have created designated Provider Care Teams.  These Care Teams include your primary Cardiologist (physician) and Advanced Practice Providers (APPs -  Physician Assistants and Nurse Practitioners) who all work together to provide you with the care you need, when you need it. ? ?Your next appointment:   ?1 year(s) ? ?The format for your next appointment:   ?In Person ? ?Provider:   ?Philip Nahser, MD  or Vin Bhagat, PA-C, Michelle Swinyer, NP, or Maleek Weaver, PA-C      ? ?Other Instructions ? ?Important Information About Sugar ? ? ? ? ?  ?

## 2022-04-17 ENCOUNTER — Ambulatory Visit (INDEPENDENT_AMBULATORY_CARE_PROVIDER_SITE_OTHER): Payer: BC Managed Care – PPO

## 2022-04-17 DIAGNOSIS — I255 Ischemic cardiomyopathy: Secondary | ICD-10-CM | POA: Diagnosis not present

## 2022-04-17 LAB — CUP PACEART REMOTE DEVICE CHECK
Battery Remaining Longevity: 95 mo
Battery Remaining Percentage: 87 %
Battery Voltage: 2.99 V
Brady Statistic RA Percent Paced: 60 %
Date Time Interrogation Session: 20230509030822
HighPow Impedance: 73 Ohm
Implantable Lead Implant Date: 20220509
Implantable Lead Implant Date: 20220509
Implantable Lead Implant Date: 20220509
Implantable Lead Location: 753858
Implantable Lead Location: 753860
Implantable Lead Location: 753860
Implantable Lead Model: 3830
Implantable Pulse Generator Implant Date: 20220509
Lead Channel Impedance Value: 500 Ohm
Lead Channel Impedance Value: 630 Ohm
Lead Channel Pacing Threshold Amplitude: 0.5 V
Lead Channel Pacing Threshold Pulse Width: 0.5 ms
Lead Channel Sensing Intrinsic Amplitude: 12 mV
Lead Channel Sensing Intrinsic Amplitude: 5 mV
Lead Channel Setting Pacing Amplitude: 2.5 V
Lead Channel Setting Sensing Sensitivity: 0.5 mV
Pulse Gen Serial Number: 810027397

## 2022-05-01 NOTE — Progress Notes (Signed)
Remote ICD transmission.   

## 2022-05-14 ENCOUNTER — Other Ambulatory Visit: Payer: Self-pay | Admitting: Neurology

## 2022-05-30 ENCOUNTER — Other Ambulatory Visit: Payer: Self-pay | Admitting: Internal Medicine

## 2022-05-30 NOTE — Telephone Encounter (Signed)
Prescription refill request for Xarelto received.  Indication:Afib Last office visit:4/23 Weight:98.1 kg Age:56 Scr:1.0 CrCl:113.09 ml/min  Prescription refilled

## 2022-06-04 ENCOUNTER — Other Ambulatory Visit: Payer: Self-pay

## 2022-06-05 ENCOUNTER — Telehealth: Payer: Self-pay | Admitting: Cardiovascular Disease

## 2022-06-05 NOTE — Telephone Encounter (Signed)
Called patient to request remote transmission. States he will send one shortly.

## 2022-06-05 NOTE — Telephone Encounter (Signed)
Remote transmission received and reviewed. Normal device function. Patient is now back in SR. AT/AF burden is <1%. Advised patient I will forward to Dr. Ladona Ridgel for review and recommendations. Reports compliance with medications reviewed.

## 2022-06-05 NOTE — Telephone Encounter (Signed)
*  STAT* If patient is at the pharmacy, call can be transferred to refill team.   1. Which medications need to be refilled? (please list name of each medication and dose if known)   venlafaxine XR (EFFEXOR-XR) 75 MG 24 hr capsule    2. Which pharmacy/location (including street and city if local pharmacy) is medication to be sent to? [id number] [place], [place] - [address]  3. Do they need a 30 day or 90 day supply?  90 day   Patient c/o Palpitations:  High priority if patient c/o lightheadedness, shortness of breath, or chest pain  How long have you had palpitations/irregular HR/ Afib? Are you having the symptoms now? No  Are you currently experiencing lightheadedness, SOB or CP? NO  Do you have a history of afib (atrial fibrillation) or irregular heart rhythm? Yes  Have you checked your BP or HR? (document readings if available): No  Are you experiencing any other symptoms? No  Pt wanted to make nurse aware that he had an episode of Afib early yesterday morning that lasted until yesterday afternoon. Pt says that he feels a lot better today. Please advise

## 2022-06-06 ENCOUNTER — Encounter: Payer: Self-pay | Admitting: Family Medicine

## 2022-06-06 ENCOUNTER — Ambulatory Visit: Payer: BC Managed Care – PPO | Admitting: Family Medicine

## 2022-06-06 VITALS — BP 104/84 | HR 69 | Temp 98.5°F | Wt 212.0 lb

## 2022-06-06 DIAGNOSIS — I428 Other cardiomyopathies: Secondary | ICD-10-CM

## 2022-06-06 DIAGNOSIS — I482 Chronic atrial fibrillation, unspecified: Secondary | ICD-10-CM

## 2022-06-06 DIAGNOSIS — Z8669 Personal history of other diseases of the nervous system and sense organs: Secondary | ICD-10-CM

## 2022-06-06 DIAGNOSIS — F419 Anxiety disorder, unspecified: Secondary | ICD-10-CM

## 2022-06-06 MED ORDER — VENLAFAXINE HCL ER 75 MG PO CP24: 75.0000 mg | ORAL_CAPSULE | Freq: Every day | ORAL | 3 refills | Status: DC

## 2022-06-06 NOTE — Progress Notes (Signed)
Subjective:    Patient ID: Dennis Cortez, male    DOB: January 13, 1966, 56 y.o.   MRN: [id number]  Chief Complaint  Patient presents with   Medication Refill    On Effexor    HPI Patient was seen today for f/u.  Pt states he has been doing well.  Needs refill on Effexor XR 75 mg.  Rx previously rx'd by Neurology after MI several yrs ago for migraines and anxiety.  Pt released from Neuro a few months ago.  Pt notes stress at work, but overall enjoys his job.  Pt was schedule to go to [facility] for business of the weekend but had to postpone trip 2/2 episode of A-fib. Remote transmission from pacer sent to the cardiology office.  Patient endorses compliance with meds.  Past Medical History:  Diagnosis Date   Atrial fibrillation (H[facility])    Cardioembolic stroke ([facility]) 05/19/2015   Chronic anticoagulation 09/15/2015   COVID    Esophageal reflux 09/15/2015   Generalized anxiety disorder 05/19/2015   H/O exercise stress test 02/2013   a. ETT-Echo with normal images but significant ST depression and hypotensive response to exercise   Hx of cardiac catheterization    LHC 03/09/13:  Smooth and normal coronary arteries, normal LVF   Hyperlipemia 10/22/2007   Qualifier: Diagnosis of  By: Alphonzo Severance MD, Loni Dolly    Hyperlipidemia    LVH (left ventricular hypertrophy)    a. Echo 02/2013:  Mild LVH, EF 55-65%, normal wall motion, mild LAE, mild RVE, mild RAE    Migraine without aura and without status migrainosus, not intractable 05/19/2015   MVP (mitral valve prolapse)     No Known Allergies  ROS General: Denies fever, chills, night sweats, changes in weight, changes in appetite HEENT: Denies headaches, ear pain, changes in vision, rhinorrhea, sore throat CV: Denies CP, palpitations, SOB, orthopnea  +afib Pulm: Denies SOB, cough, wheezing GI: Denies abdominal pain, nausea, vomiting, diarrhea, constipation GU: Denies dysuria, hematuria, frequency Msk: Denies muscle cramps, joint pains Neuro: Denies weakness,  numbness, tingling Skin: Denies rashes, bruising Psych: Denies depression, anxiety, hallucinations +increased stress    Objective:    Blood pressure 104/84, pulse 69, temperature 98.5 F (36.9 C), temperature source Oral, weight 212 lb (96.2 kg), SpO2 97 %.  Gen. Pleasant, well-nourished, in no distress, normal affect   HEENT: [place]/AT, face symmetric, conjunctiva clear, no scleral icterus, PERRLA, EOMI, nares patent without drainage Lungs: no accessory muscle use, CTAB, no wheezes or rales Cardiovascular: RRR, no m/r/g, no peripheral edema Musculoskeletal: No deformities, no cyanosis or clubbing, normal tone Neuro:  A&Ox3, CN II-XII intact, normal gait Skin:  Warm, no lesions/ rash   Wt Readings from Last 3 Encounters:  06/06/22 212 lb (96.2 kg)  03/30/22 216 lb 3.2 oz (98.1 kg)  11/10/21 213 lb 9.6 oz (96.9 kg)    Lab Results  Component Value Date   WBC 4.3 03/30/2022   HGB 14.7 03/30/2022   HCT 40.8 03/30/2022   PLT 180 03/30/2022   GLUCOSE 100 (H) 03/30/2022   CHOL 225 (H) 03/30/2022   TRIG 62 03/30/2022   HDL 77 03/30/2022   LDLCALC 137 (H) 03/30/2022   ALT 23 03/30/2022   AST 23 09/12/2016   NA 139 03/30/2022   K 4.7 03/30/2022   CL 102 03/30/2022   CREATININE 1.06 03/30/2022   BUN 12 03/30/2022   CO2 26 03/30/2022   TSH 2.39 02/17/2013   INR 1.0 03/04/2013   HGBA1C 5.1 09/18/2018  06/06/2022    3:14 PM  GAD 7 : Generalized Anxiety Score  Nervous, Anxious, on Edge 1  Control/stop worrying 1  Worry too much - different things 0  Trouble relaxing 1  Restless 0  Easily annoyed or irritable 0  Afraid - awful might happen 0  Total GAD 7 Score 3  Anxiety Difficulty Not difficult at all       06/06/2022    2:28 PM 09/21/2021    1:21 PM 09/12/2017    7:14 AM  PHQ9 SCORE ONLY  PHQ-9 Total Score 0 0 0    Assessment/Plan:  Anxiety  -Stable -GAD-7 score 3 this visit -Discussed self-care and other techniques to decrease stress and anxiety.  Patient  encouraged to plan a vacation. -Continue Effexor or XR 75 mg daily - Plan: venlafaxine XR (EFFEXOR-XR) 75 MG 24 hr capsule  History of migraine -Stable -Continue Effexor XR 75 mg daily  - Plan: venlafaxine XR (EFFEXOR-XR) 75 MG 24 hr capsule  Chronic atrial fibrillation (HCC) -Not currently in A-fib -Pacer in place -Continue Xarelto daily -Continue follow-up with cardiology  NICM (nonischemic cardiomyopathy) (HCC)  F/u as needed  Abbe Amsterdam, MD

## 2022-07-17 ENCOUNTER — Ambulatory Visit (INDEPENDENT_AMBULATORY_CARE_PROVIDER_SITE_OTHER): Payer: BC Managed Care – PPO

## 2022-07-17 DIAGNOSIS — I255 Ischemic cardiomyopathy: Secondary | ICD-10-CM | POA: Diagnosis not present

## 2022-07-17 LAB — CUP PACEART REMOTE DEVICE CHECK
Battery Remaining Longevity: 92 mo
Battery Remaining Percentage: 85 %
Battery Voltage: 2.99 V
Brady Statistic RA Percent Paced: 60 %
Date Time Interrogation Session: 20230808024907
HighPow Impedance: 73 Ohm
Implantable Lead Implant Date: 20220509
Implantable Lead Implant Date: 20220509
Implantable Lead Implant Date: 20220509
Implantable Lead Location: 753858
Implantable Lead Location: 753860
Implantable Lead Location: 753860
Implantable Lead Model: 3830
Implantable Pulse Generator Implant Date: 20220509
Lead Channel Impedance Value: 500 Ohm
Lead Channel Impedance Value: 650 Ohm
Lead Channel Pacing Threshold Amplitude: 0.5 V
Lead Channel Pacing Threshold Pulse Width: 0.5 ms
Lead Channel Sensing Intrinsic Amplitude: 12 mV
Lead Channel Sensing Intrinsic Amplitude: 5 mV
Lead Channel Setting Pacing Amplitude: 2.5 V
Lead Channel Setting Sensing Sensitivity: 0.5 mV
Pulse Gen Serial Number: 810027397

## 2022-08-22 NOTE — Progress Notes (Signed)
Remote ICD transmission.   

## 2022-09-16 NOTE — Progress Notes (Signed)
Electrophysiology Office Note Date: 09/16/2022  ID:  Rudolph Daoust, DOB January 20, 1966, MRN [id number]  PCP: Billie Ruddy, MD Primary Cardiologist: Mertie Moores, MD Electrophysiologist: Cristopher Peru, MD   CC: Routine ICD follow-up  Dennis Cortez is a 56 y.o. male seen today for Cristopher Peru, MD for routine electrophysiology followup. Since last being seen in our clinic the patient reports doing ***.  he denies chest pain, palpitations, dyspnea, PND, orthopnea, nausea, vomiting, dizziness, syncope, edema, weight gain, or early satiety.     {He/she (caps):30048} has not had ICD shocks.   Device History: St. Jude BiV ICD implanted 04/2021 for NICM Past Medical History:  Diagnosis Date   Atrial fibrillation ([place])    Cardioembolic stroke ([place]) 03/18/6758   Chronic anticoagulation 09/15/2015   COVID    Esophageal reflux 09/15/2015   Generalized anxiety disorder 05/19/2015   H/O exercise stress test 02/2013   a. ETT-Echo with normal images but significant ST depression and hypotensive response to exercise   Hx of cardiac catheterization    LHC 03/09/13:  Smooth and normal coronary arteries, normal LVF   Hyperlipemia 10/22/2007   Qualifier: Diagnosis of  By: Niel Hummer MD, Lorinda Creed    Hyperlipidemia    LVH (left ventricular hypertrophy)    a. Echo 02/2013:  Mild LVH, EF 55-65%, normal wall motion, mild LAE, mild RVE, mild RAE    Migraine without aura and without status migrainosus, not intractable 05/19/2015   MVP (mitral valve prolapse)    Past Surgical History:  Procedure Laterality Date   BIV ICD INSERTION CRT-D N/A 04/17/2021   Procedure: BIV ICD INSERTION CRT-D;  Surgeon: Evans Lance, MD;  Location: [facility];  Service: Cardiovascular;  Laterality: N/A;   KNEE SURGERY     US ECHOCARDIOGRAPHY  09/01/2008   EF 55-60%   US ECHOCARDIOGRAPHY  08/17/2005   EF 55-60%    Current Outpatient Medications  Medication Sig Dispense Refill   acetaminophen (TYLENOL) 500 MG tablet  Take 500 mg by mouth daily as needed (pain.).     colchicine 0.6 MG tablet TAKE 2 TABS AT FIRST FIGN OF GOUT, MAY REPEAT 1 TAB IN AN HOUR. TAKE DAILY AFTER FOR UP TO 10 DAYS 30 tablet 1   ezetimibe (ZETIA) 10 MG tablet TAKE 1 TABLET (10 MG TOTAL) BY MOUTH DAILY. PATIENT NEEDS TO KEEP APPOINTMENT IN APRIL FOR ANY FUTURE REFILLS. 30 tablet 0   fish oil-omega-3 fatty acids 1000 MG capsule Take 1 g by mouth in the morning.     losartan (COZAAR) 25 MG tablet Take 1 tablet (25 mg total) by mouth daily. 90 tablet 3   Multiple Vitamin (MULTIVITAMIN) tablet Take 1 tablet by mouth in the morning.     rosuvastatin (CRESTOR) 20 MG tablet TAKE 1 TABLET BY MOUTH EVERY DAY 90 tablet 1   venlafaxine XR (EFFEXOR-XR) 75 MG 24 hr capsule Take 1 capsule (75 mg total) by mouth daily. 90 capsule 3   XARELTO 20 MG TABS tablet TAKE 1 TABLET BY MOUTH EVERY DAY WITH SUPPER 90 tablet 1   No current facility-administered medications for this visit.    Allergies:   Patient has no known allergies.   Social History: Social History   Socioeconomic History   Marital status: Married    Spouse name: Not on file   Number of children: 0   Years of education: Not on file   Highest education level: Associate degree: academic program  Occupational History   Not  on file  Tobacco Use   Smoking status: Never   Smokeless tobacco: Never  Vaping Use   Vaping Use: Never used  Substance and Sexual Activity   Alcohol use: Yes    Alcohol/week: 0.0 standard drinks of alcohol    Comment: 2 beers per day   Drug use: No   Sexual activity: Yes    Partners: Female  Other Topics Concern   Not on file  Social History Narrative   Not on file   Social Determinants of Health   Financial Resource Strain: Not on file  Food Insecurity: Not on file  Transportation Needs: Not on file  Physical Activity: Not on file  Stress: Not on file  Social Connections: Not on file  Intimate Partner Violence: Not on file    Family  History: Family History  Adopted: Yes    Review of Systems: All other systems reviewed and are otherwise negative except as noted above.   Physical Exam: There were no vitals filed for this visit.   GEN- The patient is well appearing, alert and oriented x 3 today.   HEENT: normocephalic, atraumatic; sclera clear, conjunctiva pink; hearing intact; oropharynx clear; neck supple, no JVP Lymph- no cervical lymphadenopathy Lungs- Clear to ausculation bilaterally, normal work of breathing.  No wheezes, rales, rhonchi Heart- Regular  rate and rhythm, no murmurs, rubs or gallops, PMI not laterally displaced GI- soft, non-tender, non-distended, bowel sounds present, no hepatosplenomegaly Extremities- no clubbing or cyanosis. {EDEMA ZDGLO:75643} peripheral edema; DP/PT/radial pulses 2+ bilaterally MS- no significant deformity or atrophy Skin- warm and dry, no rash or lesion; ICD pocket well healed Psych- euthymic mood, full affect Neuro- strength and sensation are intact  ICD interrogation- reviewed in detail today,  See PACEART report  EKG:  EKG is ordered today. Personal review of EKG ordered today shows ***  Recent Labs: 03/30/2022: ALT 23; BUN 12; Creatinine, Ser 1.06; Hemoglobin 14.7; Platelets 180; Potassium 4.7; Sodium 139   Wt Readings from Last 3 Encounters:  06/06/22 212 lb (96.2 kg)  03/30/22 216 lb 3.2 oz (98.1 kg)  11/10/21 213 lb 9.6 oz (96.9 kg)     Other studies Reviewed: Additional studies/ records that were reviewed today include: Previous EP office notes.   Assessment and Plan:  1.  Chronic systolic dysfunction s/p St. Jude CRT-D  Echo 02/2021 LVEF 30-35% NYHA *** symptoms euvolemic today Stable on an appropriate medical regimen Normal ICD function See Pace Art report No changes today  2. Atrial fibrillation Continue Xarelto for CHA2DS2/VASc of at least 3. Rates overall well controlled.   Current medicines are reviewed at length with the patient today.    =  Labs/ tests ordered today include: *** No orders of the defined types were placed in this encounter.    Disposition:   Follow up with {EPMDS:28135} {Blank single:19197::"in 2 weeks","in 4 weeks","in 3 months","in 6 months","in 12 months","as usual post gen change"}    Signed, Graciella Freer, PA-C  09/16/2022 2:34 PM  [facility] [address] [place] [address] [phone] (office) [phone] (fax)

## 2022-09-19 ENCOUNTER — Encounter: Payer: Self-pay | Admitting: Student

## 2022-09-19 ENCOUNTER — Ambulatory Visit: Payer: BC Managed Care – PPO | Attending: Student | Admitting: Student

## 2022-09-19 VITALS — BP 110/72 | HR 72 | Ht 74.0 in | Wt 216.8 lb

## 2022-09-19 DIAGNOSIS — I5022 Chronic systolic (congestive) heart failure: Secondary | ICD-10-CM | POA: Diagnosis not present

## 2022-09-19 DIAGNOSIS — I482 Chronic atrial fibrillation, unspecified: Secondary | ICD-10-CM | POA: Diagnosis not present

## 2022-09-19 LAB — CUP PACEART INCLINIC DEVICE CHECK
Date Time Interrogation Session: 20231011083850
Implantable Lead Implant Date: 20220509
Implantable Lead Implant Date: 20220509
Implantable Lead Implant Date: 20220509
Implantable Lead Location: 753858
Implantable Lead Location: 753860
Implantable Lead Location: 753860
Implantable Lead Model: 3830
Implantable Pulse Generator Implant Date: 20220509
Pulse Gen Serial Number: 810027397

## 2022-09-19 MED ORDER — METOPROLOL SUCCINATE ER 25 MG PO TB24: 25.0000 mg | ORAL_TABLET | Freq: Every day | ORAL | 3 refills | Status: DC

## 2022-09-19 NOTE — Patient Instructions (Signed)
Medication Instructions:  Your physician has recommended you make the following change in your medication:   START: Metoprolol Succinate 25mg  daily  *If you need a refill on your cardiac medications before your next appointment, please call your pharmacy*   Lab Work: None If you have labs (blood work) drawn today and your tests are completely normal, you will receive your results only by: [place] (if you have MyChart) OR A paper copy in the mail If you have any lab test that is abnormal or we need to change your treatment, we will call you to review the results.   Testing/Procedures: Your physician has requested that you have an echocardiogram in 3 months. Echocardiography is a painless test that uses sound waves to create images of your heart. It provides your doctor with information about the size and shape of your heart and how well your heart's chambers and valves are working. This procedure takes approximately one hour. There are no restrictions for this procedure.   Follow-Up: At [facility], you and your health needs are our priority.  As part of our continuing mission to provide you with exceptional heart care, we have created designated Provider Care Teams.  These Care Teams include your primary Cardiologist (physician) and Advanced Practice Providers (APPs -  Physician Assistants and Nurse Practitioners) who all work together to provide you with the care you need, when you need it.   Your next appointment:   1 year(s)  The format for your next appointment:   In Person  Provider:   Cristopher Peru, MD     Important Information About Sugar

## 2022-10-16 ENCOUNTER — Ambulatory Visit (INDEPENDENT_AMBULATORY_CARE_PROVIDER_SITE_OTHER): Payer: BC Managed Care – PPO

## 2022-10-16 DIAGNOSIS — I428 Other cardiomyopathies: Secondary | ICD-10-CM

## 2022-10-16 LAB — CUP PACEART REMOTE DEVICE CHECK
Battery Remaining Longevity: 89 mo
Battery Remaining Percentage: 82 %
Battery Voltage: 2.99 V
Brady Statistic RA Percent Paced: 72 %
Date Time Interrogation Session: 20231107010542
HighPow Impedance: 72 Ohm
Implantable Lead Connection Status: 753985
Implantable Lead Connection Status: 753985
Implantable Lead Connection Status: 753985
Implantable Lead Implant Date: 20220509
Implantable Lead Implant Date: 20220509
Implantable Lead Implant Date: 20220509
Implantable Lead Location: 753858
Implantable Lead Location: 753860
Implantable Lead Location: 753860
Implantable Lead Model: 3830
Implantable Pulse Generator Implant Date: 20220509
Lead Channel Impedance Value: 500 Ohm
Lead Channel Impedance Value: 680 Ohm
Lead Channel Pacing Threshold Amplitude: 0.75 V
Lead Channel Pacing Threshold Pulse Width: 0.4 ms
Lead Channel Sensing Intrinsic Amplitude: 12 mV
Lead Channel Sensing Intrinsic Amplitude: 5 mV
Lead Channel Setting Pacing Amplitude: 2.5 V
Lead Channel Setting Sensing Sensitivity: 0.5 mV
Pulse Gen Serial Number: 810027397
Zone Setting Status: 755011

## 2022-10-29 NOTE — Progress Notes (Unsigned)
Patient ID: Shady Bradish                 DOB: 1966-10-10                      MRN: [id number]     HPI: Dennis Cortez is a 56 y.o. male patient of Dr Elease Hashimoto referred by Otilio Saber, PA to pharmacy clinic for HF medication management. PMH is significant for afib, stroke, pacer/ ICD placed, and CHF with EF 30-35% on 02/2021 echo. Pt seen most recently on 09/19/22 and started on Toprol 25mg  daily.   Today she returns to pharmacy clinic for further medication titration. At last visit with MD ***. Symptomatically, she is feeling ***, *** dizziness, lightheadedness, and fatigue. *** chest pain or palpitations. Feels SOB when ***. Able to complete all ADLs. Activity level ***. She *** checks her weight at home (normal range *** - *** lbs). *** LEE, PND, or orthopnea. Appetite has been ***. She *** adheres to a low-salt diet.  Weight and BP Change losartan to entresto? Start farxiga, inc toprol Needs spiro too Rx drug card? Same for next year? Bcbs scanned in doesn't have rx benefits on it  Current CHF meds: losartan 25mg  daily, Toprol 25mg  daily Previously tried: beta blocker - hypotension BP goal: <130/48mmHg  Family History: Adopted  Social History: 2 beers per day, denies drug and tobacco use  Diet:   Exercise:   Home BP readings:   Wt Readings from Last 3 Encounters:  09/19/22 216 lb 12.8 oz (98.3 kg)  06/06/22 212 lb (96.2 kg)  03/30/22 216 lb 3.2 oz (98.1 kg)   BP Readings from Last 3 Encounters:  09/19/22 110/72  06/06/22 104/84  03/30/22 118/76   Pulse Readings from Last 3 Encounters:  09/19/22 72  06/06/22 69  03/30/22 69    Renal function: CrCl cannot be calculated (Patient's most recent lab result is older than the maximum 21 days allowed.).  Past Medical History:  Diagnosis Date   Atrial fibrillation (HCC)    Cardioembolic stroke (HCC) 05/19/2015   Chronic anticoagulation 09/15/2015   COVID    Esophageal reflux 09/15/2015   Generalized anxiety disorder 05/19/2015    H/O exercise stress test 02/2013   a. ETT-Echo with normal images but significant ST depression and hypotensive response to exercise   Hx of cardiac catheterization    LHC 03/09/13:  Smooth and normal coronary arteries, normal LVF   Hyperlipemia 10/22/2007   Qualifier: Diagnosis of  By: 03/2013 MD, 03/11/13    Hyperlipidemia    LVH (left ventricular hypertrophy)    a. Echo 02/2013:  Mild LVH, EF 55-65%, normal wall motion, mild LAE, mild RVE, mild RAE    Migraine without aura and without status migrainosus, not intractable 05/19/2015   MVP (mitral valve prolapse)     Current Outpatient Medications on File Prior to Visit  Medication Sig Dispense Refill   acetaminophen (TYLENOL) 500 MG tablet Take 500 mg by mouth daily as needed (pain.).     colchicine 0.6 MG tablet TAKE 2 TABS AT FIRST FIGN OF GOUT, MAY REPEAT 1 TAB IN AN HOUR. TAKE DAILY AFTER FOR UP TO 10 DAYS 30 tablet 1   ezetimibe (ZETIA) 10 MG tablet TAKE 1 TABLET (10 MG TOTAL) BY MOUTH DAILY. PATIENT NEEDS TO KEEP APPOINTMENT IN [facility] FOR ANY FUTURE REFILLS. 30 tablet 0   fish oil-omega-3 fatty acids 1000 MG capsule Take 1 g by mouth in the morning.  losartan (COZAAR) 25 MG tablet Take 1 tablet (25 mg total) by mouth daily. 90 tablet 3   metoprolol succinate (TOPROL XL) 25 MG 24 hr tablet Take 1 tablet (25 mg total) by mouth daily. 90 tablet 3   Multiple Vitamin (MULTIVITAMIN) tablet Take 1 tablet by mouth in the morning.     rosuvastatin (CRESTOR) 20 MG tablet TAKE 1 TABLET BY MOUTH EVERY DAY 90 tablet 1   venlafaxine XR (EFFEXOR-XR) 75 MG 24 hr capsule Take 1 capsule (75 mg total) by mouth daily. 90 capsule 3   XARELTO 20 MG TABS tablet TAKE 1 TABLET BY MOUTH EVERY DAY WITH SUPPER 90 tablet 1   No current facility-administered medications on file prior to visit.    No Known Allergies   Assessment/Plan:  1. CHF -

## 2022-10-30 ENCOUNTER — Ambulatory Visit: Payer: BC Managed Care – PPO | Attending: Cardiology | Admitting: Pharmacist

## 2022-10-30 VITALS — BP 112/72 | HR 70 | Wt 215.0 lb

## 2022-10-30 DIAGNOSIS — I5022 Chronic systolic (congestive) heart failure: Secondary | ICD-10-CM

## 2022-10-30 DIAGNOSIS — I482 Chronic atrial fibrillation, unspecified: Secondary | ICD-10-CM | POA: Diagnosis not present

## 2022-10-30 MED ORDER — ROSUVASTATIN CALCIUM 20 MG PO TABS: 20.0000 mg | ORAL_TABLET | Freq: Every day | ORAL | 3 refills | Status: DC

## 2022-10-30 MED ORDER — METOPROLOL SUCCINATE ER 50 MG PO TB24: 50.0000 mg | ORAL_TABLET | Freq: Every day | ORAL | 1 refills | Status: DC

## 2022-10-30 MED ORDER — DAPAGLIFLOZIN PROPANEDIOL 10 MG PO TABS: 10.0000 mg | ORAL_TABLET | Freq: Every day | ORAL | 5 refills | Status: DC

## 2022-10-30 NOTE — Patient Instructions (Addendum)
Your blood pressure is at goal < 130/41mmHg  Increase your metoprolol from 25mg  to 50mg  once daily  Start Farxiga 10mg  1 tablet once daily  Continue your other medications  Follow up for blood pressure check and lab work in 3 weeks

## 2022-11-09 NOTE — Progress Notes (Signed)
Remote ICD transmission.   

## 2022-11-21 ENCOUNTER — Other Ambulatory Visit: Payer: Self-pay | Admitting: Student

## 2022-11-21 DIAGNOSIS — I5022 Chronic systolic (congestive) heart failure: Secondary | ICD-10-CM

## 2022-11-21 DIAGNOSIS — I482 Chronic atrial fibrillation, unspecified: Secondary | ICD-10-CM

## 2022-11-26 ENCOUNTER — Ambulatory Visit: Payer: BC Managed Care – PPO | Attending: Cardiovascular Disease | Admitting: Pharmacist

## 2022-11-26 VITALS — BP 100/72 | HR 74 | Wt 217.0 lb

## 2022-11-26 DIAGNOSIS — I5022 Chronic systolic (congestive) heart failure: Secondary | ICD-10-CM

## 2022-11-26 LAB — BASIC METABOLIC PANEL
BUN/Creatinine Ratio: 14 (ref 9–20)
BUN: 14 mg/dL (ref 6–24)
CO2: 23 mmol/L (ref 20–29)
Calcium: 9.3 mg/dL (ref 8.7–10.2)
Chloride: 105 mmol/L (ref 96–106)
Creatinine, Ser: 1.02 mg/dL (ref 0.76–1.27)
Glucose: 90 mg/dL (ref 70–99)
Potassium: 5.1 mmol/L (ref 3.5–5.2)
Sodium: 141 mmol/L (ref 134–144)
eGFR: 86 mL/min/{1.73_m2} (ref >59)

## 2022-11-26 NOTE — Progress Notes (Signed)
Patient ID: Dennis Cortez                 DOB: 1965-12-22                      MRN: [id number]     HPI: Dennis Cortez is a 56 y.o. male patient of Dr Elease Hashimoto referred by Otilio Saber, PA to pharmacy clinic for HF medication management. PMH is significant for afib, stroke, pacer/ ICD placed, and CHF with EF 30-35% on 02/2021 echo. I saw pt on 11/21 and increased Toprol and started [place]. Provided pt with $0 copay card for [place] as well. Pt presents today for further HF med optimization.  Pt presents today for follow up. Reports tolerating medications well although has noticed some intermittent cramping in his side. If he breathes in deeply, feels a bit of congestion. Not sick, no cough, no SOB, no swelling, no dizziness, weight stable. Does not have BP cuff at home. No trouble with activity level, overall feels very well. Feels calmer overall. Has follow up echo scheduled on 12/18/22. Takes all meds in the AM and took prior to visit today.   Current CHF meds: losartan 25mg  daily, Toprol 50mg  daily, Farxiga 10mg  daily - all in AM Previously tried: beta blocker - hypotension BP goal: <130/49mmHg  Family History: Adopted  Social History: 2 beers per day, denies drug and tobacco use  Wt Readings from Last 3 Encounters:  10/30/22 215 lb (97.5 kg)  09/19/22 216 lb 12.8 oz (98.3 kg)  06/06/22 212 lb (96.2 kg)   BP Readings from Last 3 Encounters:  10/30/22 112/72  09/19/22 110/72  06/06/22 104/84   Pulse Readings from Last 3 Encounters:  10/30/22 70  09/19/22 72  06/06/22 69    Renal function: CrCl cannot be calculated (Patient's most recent lab result is older than the maximum 21 days allowed.).  Past Medical History:  Diagnosis Date   Atrial fibrillation (HCC)    Cardioembolic stroke (HCC) 05/19/2015   Chronic anticoagulation 09/15/2015   COVID    Esophageal reflux 09/15/2015   Generalized anxiety disorder 05/19/2015   H/O exercise stress test 02/2013   a. ETT-Echo with normal images  but significant ST depression and hypotensive response to exercise   Hx of cardiac catheterization    LHC 03/09/13:  Smooth and normal coronary arteries, normal LVF   Hyperlipemia 10/22/2007   Qualifier: Diagnosis of  By: 03/2013 MD, 03/11/13    Hyperlipidemia    LVH (left ventricular hypertrophy)    a. Echo 02/2013:  Mild LVH, EF 55-65%, normal wall motion, mild LAE, mild RVE, mild RAE    Migraine without aura and without status migrainosus, not intractable 05/19/2015   MVP (mitral valve prolapse)     Current Outpatient Medications on File Prior to Visit  Medication Sig Dispense Refill   acetaminophen (TYLENOL) 500 MG tablet Take 500 mg by mouth daily as needed (pain.).     colchicine 0.6 MG tablet TAKE 2 TABS AT FIRST FIGN OF GOUT, MAY REPEAT 1 TAB IN AN HOUR. TAKE DAILY AFTER FOR UP TO 10 DAYS 30 tablet 1   dapagliflozin propanediol (FARXIGA) 10 MG TABS tablet Take 1 tablet (10 mg total) by mouth daily before breakfast. 30 tablet 5   ezetimibe (ZETIA) 10 MG tablet TAKE 1 TABLET (10 MG TOTAL) BY MOUTH DAILY. PATIENT NEEDS TO KEEP APPOINTMENT IN [facility] FOR ANY FUTURE REFILLS. 30 tablet 0   fish oil-omega-3 fatty acids 1000  MG capsule Take 1 g by mouth in the morning.     losartan (COZAAR) 25 MG tablet Take 1 tablet (25 mg total) by mouth daily. 90 tablet 3   metoprolol succinate (TOPROL-XL) 50 MG 24 hr tablet TAKE 1 TABLET BY MOUTH EVERY DAY 90 tablet 3   Multiple Vitamin (MULTIVITAMIN) tablet Take 1 tablet by mouth in the morning.     rosuvastatin (CRESTOR) 20 MG tablet Take 1 tablet (20 mg total) by mouth daily. 90 tablet 3   venlafaxine XR (EFFEXOR-XR) 75 MG 24 hr capsule Take 1 capsule (75 mg total) by mouth daily. 90 capsule 3   XARELTO 20 MG TABS tablet TAKE 1 TABLET BY MOUTH EVERY DAY WITH SUPPER 90 tablet 1   No current facility-administered medications on file prior to visit.    No Known Allergies   Assessment/Plan:  1. CHF with EF 30-35%  - BP a bit soft today. Checking  BMET with recent Farxiga start. Pt has repeat echo on 12/18/22. For now, will continue Toprol 50mg  daily, Farxiga 10mg  daily, and losartan 25mg  daily as BP is limiting dose titrations/change to ARNI/MRA start. Pending echo results, could try low dose spironolactone but would likely need to back off on losartan dose given lower BP. Will f/u with pt after echo in a few weeks.  Cristian Grieves E. Robbyn Hodkinson, PharmD, BCACP, CPP [facility] [address], [place], [address] Phone: [phone]; Fax: [phone] 11/26/2022 7:50 AM

## 2022-11-26 NOTE — Patient Instructions (Addendum)
Your blood pressure is at goal < 130/80  Continue taking your current medications  Keep your echo on 1/9

## 2022-11-28 ENCOUNTER — Other Ambulatory Visit: Payer: Self-pay | Admitting: Pharmacist

## 2022-11-28 MED ORDER — DAPAGLIFLOZIN PROPANEDIOL 10 MG PO TABS: 10.0000 mg | ORAL_TABLET | Freq: Every day | ORAL | 3 refills | Status: DC

## 2022-12-10 ENCOUNTER — Other Ambulatory Visit: Payer: Self-pay | Admitting: Internal Medicine

## 2022-12-11 NOTE — Telephone Encounter (Signed)
Prescription refill request for Xarelto received.  Indication:afib Last office visit:12/23 Weight:98.4  kg Age:57 Scr:1.0 CrCl:113.43  ml/min  Prescription refilled

## 2022-12-18 ENCOUNTER — Ambulatory Visit (HOSPITAL_COMMUNITY): Payer: BC Managed Care – PPO | Attending: Student

## 2022-12-18 DIAGNOSIS — I5022 Chronic systolic (congestive) heart failure: Secondary | ICD-10-CM | POA: Diagnosis not present

## 2022-12-18 LAB — ECHOCARDIOGRAM COMPLETE
Area-P 1/2: 4.8 cm2
S' Lateral: 3.8 cm

## 2023-01-15 ENCOUNTER — Ambulatory Visit: Payer: BC Managed Care – PPO

## 2023-01-15 DIAGNOSIS — I255 Ischemic cardiomyopathy: Secondary | ICD-10-CM | POA: Diagnosis not present

## 2023-01-15 LAB — CUP PACEART REMOTE DEVICE CHECK
Battery Remaining Longevity: 86 mo
Battery Remaining Percentage: 80 %
Battery Voltage: 2.99 V
Brady Statistic RA Percent Paced: 75 %
Date Time Interrogation Session: 20240206040510
HighPow Impedance: 73 Ohm
Implantable Lead Connection Status: 753985
Implantable Lead Connection Status: 753985
Implantable Lead Connection Status: 753985
Implantable Lead Implant Date: 20220509
Implantable Lead Implant Date: 20220509
Implantable Lead Implant Date: 20220509
Implantable Lead Location: 753858
Implantable Lead Location: 753860
Implantable Lead Location: 753860
Implantable Lead Model: 3830
Implantable Pulse Generator Implant Date: 20220509
Lead Channel Impedance Value: 530 Ohm
Lead Channel Impedance Value: 610 Ohm
Lead Channel Pacing Threshold Amplitude: 0.75 V
Lead Channel Pacing Threshold Pulse Width: 0.4 ms
Lead Channel Sensing Intrinsic Amplitude: 12 mV
Lead Channel Sensing Intrinsic Amplitude: 5 mV
Lead Channel Setting Pacing Amplitude: 2.5 V
Lead Channel Setting Sensing Sensitivity: 0.5 mV
Pulse Gen Serial Number: 810027397
Zone Setting Status: 755011

## 2023-02-18 NOTE — Progress Notes (Signed)
Remote ICD transmission.   

## 2023-03-01 ENCOUNTER — Other Ambulatory Visit: Payer: Self-pay | Admitting: Family Medicine

## 2023-03-01 ENCOUNTER — Other Ambulatory Visit: Payer: Self-pay | Admitting: Student

## 2023-03-01 DIAGNOSIS — Z8669 Personal history of other diseases of the nervous system and sense organs: Secondary | ICD-10-CM

## 2023-03-01 DIAGNOSIS — F419 Anxiety disorder, unspecified: Secondary | ICD-10-CM

## 2023-04-16 ENCOUNTER — Ambulatory Visit (INDEPENDENT_AMBULATORY_CARE_PROVIDER_SITE_OTHER): Payer: BC Managed Care – PPO

## 2023-04-16 DIAGNOSIS — I255 Ischemic cardiomyopathy: Secondary | ICD-10-CM

## 2023-04-16 LAB — CUP PACEART REMOTE DEVICE CHECK
Battery Remaining Longevity: 83 mo
Battery Remaining Percentage: 78 %
Battery Voltage: 2.99 V
Brady Statistic RA Percent Paced: 75 %
Date Time Interrogation Session: 20240507033043
HighPow Impedance: 74 Ohm
Implantable Lead Connection Status: 753985
Implantable Lead Connection Status: 753985
Implantable Lead Connection Status: 753985
Implantable Lead Implant Date: 20220509
Implantable Lead Implant Date: 20220509
Implantable Lead Implant Date: 20220509
Implantable Lead Location: 753858
Implantable Lead Location: 753860
Implantable Lead Location: 753860
Implantable Lead Model: 3830
Implantable Pulse Generator Implant Date: 20220509
Lead Channel Impedance Value: 500 Ohm
Lead Channel Impedance Value: 610 Ohm
Lead Channel Pacing Threshold Amplitude: 0.75 V
Lead Channel Pacing Threshold Pulse Width: 0.4 ms
Lead Channel Sensing Intrinsic Amplitude: 12 mV
Lead Channel Sensing Intrinsic Amplitude: 5 mV
Lead Channel Setting Pacing Amplitude: 2.5 V
Lead Channel Setting Sensing Sensitivity: 0.5 mV
Pulse Gen Serial Number: 810027397
Zone Setting Status: 755011

## 2023-04-26 ENCOUNTER — Other Ambulatory Visit: Payer: Self-pay | Admitting: Cardiovascular Disease

## 2023-05-08 NOTE — Progress Notes (Signed)
Remote ICD transmission.   

## 2023-05-23 ENCOUNTER — Telehealth: Payer: Self-pay | Admitting: Cardiovascular Disease

## 2023-05-23 NOTE — Telephone Encounter (Signed)
Pt stated he went to Ortho clinic on 6/12 for left shoulder pain and was advised he may need surgery. Pt stated he will start receiving cortisone injections for the next 3 months  and have surgery toward the end of this year.

## 2023-05-23 NOTE — Telephone Encounter (Signed)
Patient said that he would like to let Dr. Elease Hashimoto aware of him needing shoulder replacement surgery.

## 2023-06-11 NOTE — Progress Notes (Unsigned)
  Cardiology Office Note:  .   Date:  06/11/2023  ID:  Dennis Cortez, DOB August 01, 1966, MRN [id number] PCP: Deeann Saint, MD  [facility] Providers Cardiologist:  Kristeen Miss, MD Electrophysiologist:  Lewayne Bunting, MD { Click to update primary MD,subspecialty MD or APP then REFRESH:1}   Patient Profile: .      PMH: Permanent atrial fibrillation CHA2DS2-VASc score 4 Chronic HFrEF Non-ischemic cardiomyopathy cMRI 11/2018 EF 49, no LGE BiV ICD implant 04/17/2021 Echo 12/18/22 LVEF 40-45%, G2DD Hx CVA 2015 Mitral valve prolapse Hyperlipidemia Cardiac catheterization 02/2013 normal coronary arteries Left bundle branch block        History of Present Illness: .   Dennis Cortez is a *** 57 y.o. male ***  He established with cardiology for management of atrial fibrillation and has maintained consistent follow-up with Dr. Elease Hashimoto. Cardiac MRI 11/2018 revealed EF 49%, no evidence of scarring, amloidosis or sarcoidosis. EF 30-35% on echo 02/16/2021. He underwent BiV ICD implant 04/2021. Most recent echo 12/18/22 revealed improved LVEF 40-45%, severe biatrial enlargement, mild LVH, G2DD, and mildly reduced RV function, mild MR, mild dilatation of aortic root at 40 mm.   Last cardiology clinic visit was 11/26/2022 with Margaretmary Dys, [facility] for heart failure medication titration. Soft BP limited further medication titration and he was advised to continue Farxiga, Toprol, and losartan.   ROS: ***       Studies Reviewed: .        *** Risk Assessment/Calculations:    CHA2DS2-VASc Score = 4  {Click here to calculate score.  REFRESH note before signing. :1} This indicates a 4.8% annual risk of stroke. The patient's score is based upon: CHF History: 1 HTN History: 1 Diabetes History: 0 Stroke History: 2 Vascular Disease History: 0 Age Score: 0 Gender Score: 0   {This patient has a significant risk of stroke if diagnosed with atrial fibrillation.  Please consider VKA or DOAC agent for  anticoagulation if the bleeding risk is acceptable.   You can also use the SmartPhrase .[email or web address] for documentation.   :[id number]} No BP recorded.  {Refresh Note OR Click here to enter BP  :1}***       Physical Exam:   VS:  There were no vitals taken for this visit.   Wt Readings from Last 3 Encounters:  11/26/22 217 lb (98.4 kg)  10/30/22 215 lb (97.5 kg)  09/19/22 216 lb 12.8 oz (98.3 kg)    GEN: Well nourished, well developed in no acute distress NECK: No JVD; No carotid bruits CARDIAC: ***RRR, no murmurs, rubs, gallops RESPIRATORY:  Clear to auscultation without rales, wheezing or rhonchi  ABDOMEN: Soft, non-tender, non-distended EXTREMITIES:  No edema; No deformity     ASSESSMENT AND PLAN: .    Longstanding atrial fibrillation on chronic anticoagulation: Chronic HFrEF: Hyperlipidemia: ICD implant:    {Are you ordering a CV Procedure (e.g. stress test, cath, DCCV, TEE, etc)?   Press F2        :[id number]}  Dispo: ***  Signed, Eligha Bridegroom, NP-C

## 2023-06-19 ENCOUNTER — Ambulatory Visit: Payer: BC Managed Care – PPO | Attending: Nurse Practitioner | Admitting: Nurse Practitioner

## 2023-06-19 ENCOUNTER — Other Ambulatory Visit: Payer: Self-pay | Admitting: Student

## 2023-06-19 ENCOUNTER — Encounter: Payer: Self-pay | Admitting: Nurse Practitioner

## 2023-06-19 VITALS — BP 118/80 | HR 72 | Ht 74.0 in | Wt 214.0 lb

## 2023-06-19 DIAGNOSIS — I428 Other cardiomyopathies: Secondary | ICD-10-CM

## 2023-06-19 DIAGNOSIS — I5022 Chronic systolic (congestive) heart failure: Secondary | ICD-10-CM

## 2023-06-19 DIAGNOSIS — E785 Hyperlipidemia, unspecified: Secondary | ICD-10-CM

## 2023-06-19 DIAGNOSIS — I482 Chronic atrial fibrillation, unspecified: Secondary | ICD-10-CM

## 2023-06-19 DIAGNOSIS — Z95810 Presence of automatic (implantable) cardiac defibrillator: Secondary | ICD-10-CM | POA: Diagnosis not present

## 2023-06-19 DIAGNOSIS — I447 Left bundle-branch block, unspecified: Secondary | ICD-10-CM | POA: Diagnosis not present

## 2023-06-19 DIAGNOSIS — Z7901 Long term (current) use of anticoagulants: Secondary | ICD-10-CM

## 2023-06-19 NOTE — Patient Instructions (Signed)
Medication Instructions:  Your physician has recommended you make the following change in your medication:  1.Increase metoprolol succinate (Toprol XL) to 25 mg in the morning and 50 mg at bedtime 2.Hold farxiga for 2 weeks. Call and let us know if the pain that you are having does not improve. *If you need a refill on your cardiac medications before your next appointment, please call your pharmacy*  Lab Work: LIPID, CMET-TODAY If you have labs (blood work) drawn today and your tests are completely normal, you will receive your results only by: MyChart Message (if you have MyChart) OR A paper copy in the mail If you have any lab test that is abnormal or we need to change your treatment, we will call you to review the results.  Follow-Up: At [facility], you and your health needs are our priority.  As part of our continuing mission to provide you with exceptional heart care, we have created designated Provider Care Teams.  These Care Teams include your primary Cardiologist (physician) and Advanced Practice Providers (APPs -  Physician Assistants and Nurse Practitioners) who all work together to provide you with the care you need, when you need it.  Your next appointment:   4 week(s)  Provider:   Eligha Bridegroom, NP        DASH Eating Plan DASH stands for Dietary Approaches to Stop Hypertension. The DASH eating plan is a healthy eating plan that has been shown to: Lower high blood pressure (hypertension). Reduce your risk for type 2 diabetes, heart disease, and stroke. Help with weight loss. What are tips for following this plan? Reading food labels Check food labels for the amount of salt (sodium) per serving. Choose foods with less than 5 percent of the Daily Value (DV) of sodium. In general, foods with less than 300 milligrams (mg) of sodium per serving fit into this eating plan. To find whole grains, look for the word "whole" as the first word in the ingredient  list. Shopping Buy products labeled as "low-sodium" or "no salt added." Buy fresh foods. Avoid canned foods and pre-made or frozen meals. Cooking Try not to add salt when you cook. Use salt-free seasonings or herbs instead of table salt or sea salt. Check with your health care provider or pharmacist before using salt substitutes. Do not fry foods. Cook foods in healthy ways, such as baking, boiling, grilling, roasting, or broiling. Cook using oils that are good for your heart. These include olive, canola, avocado, soybean, and sunflower oil. Meal planning  Eat a balanced diet. This should include: 4 or more servings of fruits and 4 or more servings of vegetables each day. Try to fill half of your plate with fruits and vegetables. 6-8 servings of whole grains each day. 6 or less servings of lean meat, poultry, or fish each day. 1 oz is 1 serving. A 3 oz (85 g) serving of meat is about the same size as the palm of your hand. One egg is 1 oz (28 g). 2-3 servings of low-fat dairy each day. One serving is 1 cup (237 mL). 1 serving of nuts, seeds, or beans 5 times each week. 2-3 servings of heart-healthy fats. Healthy fats called omega-3 fatty acids are found in foods such as walnuts, flaxseeds, fortified milks, and eggs. These fats are also found in cold-water fish, such as sardines, salmon, and mackerel. Limit how much you eat of: Canned or prepackaged foods. Food that is high in trans fat, such as fried foods.  Food that is high in saturated fat, such as fatty meat. Desserts and other sweets, sugary drinks, and other foods with added sugar. Full-fat dairy products. Do not salt foods before eating. Do not eat more than 4 egg yolks a week. Try to eat at least 2 vegetarian meals a week. Eat more home-cooked food and less restaurant, buffet, and fast food. Lifestyle When eating at a restaurant, ask if your food can be made with less salt or no salt. If you drink alcohol: Limit how much you have  to: 0-1 drink a day if you are male. 0-2 drinks a day if you are male. Know how much alcohol is in your drink. In the [place] one drink is one 12 oz bottle of beer (355 mL), one 5 oz glass of wine (148 mL), or one 1 oz glass of hard liquor (44 mL). General information Avoid eating more than 2,300 mg of salt a day. If you have hypertension, you may need to reduce your sodium intake to 1,500 mg a day. Work with your provider to stay at a healthy body weight or lose weight. Ask what the best weight range is for you. On most days of the week, get at least 30 minutes of exercise that causes your heart to beat faster. This may include walking, swimming, or biking. Work with your provider or dietitian to adjust your eating plan to meet your specific calorie needs. What foods should I eat? Fruits All fresh, dried, or frozen fruit. Canned fruits that are in their natural juice and do not have sugar added to them. Vegetables Fresh or frozen vegetables that are raw, steamed, roasted, or grilled. Low-sodium or reduced-sodium tomato and vegetable juice. Low-sodium or reduced-sodium tomato sauce and tomato paste. Low-sodium or reduced-sodium canned vegetables. Grains Whole-grain or whole-wheat bread. Whole-grain or whole-wheat pasta. Brown rice. Orpah Cobb. Bulgur. Whole-grain and low-sodium cereals. Pita bread. Low-fat, low-sodium crackers. Whole-wheat flour tortillas. Meats and other proteins Skinless chicken or [place]. Ground chicken or [place]. Pork with fat trimmed off. Fish and seafood. Egg whites. Dried beans, peas, or lentils. Unsalted nuts, nut butters, and seeds. Unsalted canned beans. Lean cuts of beef with fat trimmed off. Low-sodium, lean precooked or cured meat, such as sausages or meat loaves. Dairy Low-fat (1%) or fat-free (skim) milk. Reduced-fat, low-fat, or fat-free cheeses. Nonfat, low-sodium ricotta or cottage cheese. Low-fat or nonfat yogurt. Low-fat, low-sodium cheese. Fats and  oils Soft margarine without trans fats. Vegetable oil. Reduced-fat, low-fat, or light mayonnaise and salad dressings (reduced-sodium). Canola, safflower, olive, avocado, soybean, and sunflower oils. Avocado. Seasonings and condiments Herbs. Spices. Seasoning mixes without salt. Other foods Unsalted popcorn and pretzels. Fat-free sweets. The items listed above may not be all the foods and drinks you can have. Talk to a dietitian to learn more. What foods should I avoid? Fruits Canned fruit in a light or heavy syrup. Fried fruit. Fruit in cream or butter sauce. Vegetables Creamed or fried vegetables. Vegetables in a cheese sauce. Regular canned vegetables that are not marked as low-sodium or reduced-sodium. Regular canned tomato sauce and paste that are not marked as low-sodium or reduced-sodium. Regular tomato and vegetable juices that are not marked as low-sodium or reduced-sodium. Rosita Fire. Olives. Grains Baked goods made with fat, such as croissants, muffins, or some breads. Dry pasta or rice meal packs. Meats and other proteins Fatty cuts of meat. Ribs. Fried meat. Tomasa Blase. Bologna, salami, and other precooked or cured meats, such as sausages or meat loaves, that are not  lean and low in sodium. Fat from the back of a pig (fatback). Bratwurst. Salted nuts and seeds. Canned beans with added salt. Canned or smoked fish. Whole eggs or egg yolks. Chicken or [place] with skin. Dairy Whole or 2% milk, cream, and half-and-half. Whole or full-fat cream cheese. Whole-fat or sweetened yogurt. Full-fat cheese. Nondairy creamers. Whipped toppings. Processed cheese and cheese spreads. Fats and oils Butter. Stick margarine. Lard. Shortening. Ghee. Bacon fat. Tropical oils, such as coconut, palm kernel, or palm oil. Seasonings and condiments Onion salt, garlic salt, seasoned salt, table salt, and sea salt. Worcestershire sauce. Tartar sauce. Barbecue sauce. Teriyaki sauce. Soy sauce, including reduced-sodium soy  sauce. Steak sauce. Canned and packaged gravies. Fish sauce. Oyster sauce. Cocktail sauce. Store-bought horseradish. Ketchup. Mustard. Meat flavorings and tenderizers. Bouillon cubes. Hot sauces. Pre-made or packaged marinades. Pre-made or packaged taco seasonings. Relishes. Regular salad dressings. Other foods Salted popcorn and pretzels. The items listed above may not be all the foods and drinks you should avoid. Talk to a dietitian to learn more. Where to find more information National Heart, Lung, and Blood Institute (NHLBI): BuffaloDryCleaner.gl American Heart Association (AHA): heart.org Academy of Nutrition and Dietetics: eatright.org National Kidney Foundation (NKF): kidney.org This information is not intended to replace advice given to you by your health care provider. Make sure you discuss any questions you have with your health care provider. Document Revised: 12/13/2022 Document Reviewed: 12/13/2022 Elsevier Patient Education  2024 Elsevier Inc.  Mediterranean Diet A Mediterranean diet refers to food and lifestyle choices that are based on the traditions of countries located on the [employer]. It focuses on eating more fruits, vegetables, whole grains, beans, nuts, seeds, and heart-healthy fats, and eating less dairy, meat, eggs, and processed foods with added sugar, salt, and fat. This way of eating has been shown to help prevent certain conditions and improve outcomes for people who have chronic diseases, like kidney disease and heart disease. What are tips for following this plan? Reading food labels Check the serving size of packaged foods. For foods such as rice and pasta, the serving size refers to the amount of cooked product, not dry. Check the total fat in packaged foods. Avoid foods that have saturated fat or trans fats. Check the ingredient list for added sugars, such as corn syrup. Shopping  Buy a variety of foods that offer a balanced diet, including: Fresh fruits and  vegetables (produce). Grains, beans, nuts, and seeds. Some of these may be available in unpackaged forms or large amounts (in bulk). Fresh seafood. Poultry and eggs. Low-fat dairy products. Buy whole ingredients instead of prepackaged foods. Buy fresh fruits and vegetables in-season from local farmers markets. Buy plain frozen fruits and vegetables. If you do not have access to quality fresh seafood, buy precooked frozen shrimp or canned fish, such as tuna, salmon, or sardines. Stock your pantry so you always have certain foods on hand, such as olive oil, canned tuna, canned tomatoes, rice, pasta, and beans. Cooking Cook foods with extra-virgin olive oil instead of using butter or other vegetable oils. Have meat as a side dish, and have vegetables or grains as your main dish. This means having meat in small portions or adding small amounts of meat to foods like pasta or stew. Use beans or vegetables instead of meat in common dishes like chili or lasagna. Experiment with different cooking methods. Try roasting, broiling, steaming, and sauting vegetables. Add frozen vegetables to soups, stews, pasta, or rice. Add nuts or seeds for  added healthy fats and plant protein at each meal. You can add these to yogurt, salads, or vegetable dishes. Marinate fish or vegetables using olive oil, lemon juice, garlic, and fresh herbs. Meal planning Plan to eat one vegetarian meal one day each week. Try to work up to two vegetarian meals, if possible. Eat seafood two or more times a week. Have healthy snacks readily available, such as: Vegetable sticks with hummus. Greek yogurt. Fruit and nut trail mix. Eat balanced meals throughout the week. This includes: Fruit: 2-3 servings a day. Vegetables: 4-5 servings a day. Low-fat dairy: 2 servings a day. Fish, poultry, or lean meat: 1 serving a day. Beans and legumes: 2 or more servings a week. Nuts and seeds: 1-2 servings a day. Whole grains: 6-8 servings a  day. Extra-virgin olive oil: 3-4 servings a day. Limit red meat and sweets to only a few servings a month. Lifestyle  Cook and eat meals together with your family, when possible. Drink enough fluid to keep your urine pale yellow. Be physically active every day. This includes: Aerobic exercise like running or swimming. Leisure activities like gardening, walking, or housework. Get 7-8 hours of sleep each night. If recommended by your health care provider, drink red wine in moderation. This means 1 glass a day for nonpregnant women and 2 glasses a day for men. A glass of wine equals 5 oz (150 mL). What foods should I eat? Fruits Apples. Apricots. Avocado. Berries. Bananas. Cherries. Dates. Figs. Grapes. Lemons. Melon. Oranges. Peaches. Plums. Pomegranate. Vegetables Artichokes. Beets. Broccoli. Cabbage. Carrots. Eggplant. Green beans. Chard. Kale. Spinach. Onions. Leeks. Peas. Squash. Tomatoes. Peppers. Radishes. Grains Whole-grain pasta. Brown rice. Bulgur wheat. Polenta. Couscous. Whole-wheat bread. Orpah Cobb. Meats and other proteins Beans. Almonds. Sunflower seeds. Pine nuts. Peanuts. Cod. Salmon. Scallops. Shrimp. Tuna. Tilapia. Clams. Oysters. Eggs. Poultry without skin. Dairy Low-fat milk. Cheese. Greek yogurt. Fats and oils Extra-virgin olive oil. Avocado oil. Grapeseed oil. Beverages Water. Red wine. Herbal tea. Sweets and desserts Greek yogurt with honey. Baked apples. Poached pears. Trail mix. Seasonings and condiments Basil. Cilantro. Coriander. Cumin. Mint. Parsley. Sage. Rosemary. Tarragon. Garlic. Oregano. Thyme. Pepper. Balsamic vinegar. Tahini. Hummus. Tomato sauce. Olives. Mushrooms. The items listed above may not be a complete list of foods and beverages you can eat. Contact a dietitian for more information. What foods should I limit? This is a list of foods that should be eaten rarely or only on special occasions. Fruits Fruit canned in  syrup. Vegetables Deep-fried potatoes (french fries). Grains Prepackaged pasta or rice dishes. Prepackaged cereal with added sugar. Prepackaged snacks with added sugar. Meats and other proteins Beef. Pork. Lamb. Poultry with skin. Hot dogs. Tomasa Blase. Dairy Ice cream. Sour cream. Whole milk. Fats and oils Butter. Canola oil. Vegetable oil. Beef fat (tallow). Lard. Beverages Juice. Sugar-sweetened soft drinks. Beer. Liquor and spirits. Sweets and desserts Cookies. Cakes. Pies. Candy. Seasonings and condiments Mayonnaise. Pre-made sauces and marinades. The items listed above may not be a complete list of foods and beverages you should limit. Contact a dietitian for more information. Summary The Mediterranean diet includes both food and lifestyle choices. Eat a variety of fresh fruits and vegetables, beans, nuts, seeds, and whole grains. Limit the amount of red meat and sweets that you eat. If recommended by your health care provider, drink red wine in moderation. This means 1 glass a day for nonpregnant women and 2 glasses a day for men. A glass of wine equals 5 oz (150 mL). This information is  not intended to replace advice given to you by your health care provider. Make sure you discuss any questions you have with your health care provider. Document Revised: 01/01/2020 Document Reviewed: 10/29/2019 Elsevier Patient Education  2023 [employer].

## 2023-06-20 DIAGNOSIS — E785 Hyperlipidemia, unspecified: Secondary | ICD-10-CM

## 2023-06-20 LAB — COMPREHENSIVE METABOLIC PANEL
ALT: 33 IU/L (ref 0–44)
AST: 28 IU/L (ref 0–40)
Albumin: 4.9 g/dL (ref 3.8–4.9)
Alkaline Phosphatase: 81 IU/L (ref 44–121)
BUN/Creatinine Ratio: 10 (ref 9–20)
BUN: 10 mg/dL (ref 6–24)
Bilirubin Total: 0.8 mg/dL (ref 0.0–1.2)
CO2: 24 mmol/L (ref 20–29)
Calcium: 9.8 mg/dL (ref 8.7–10.2)
Chloride: 100 mmol/L (ref 96–106)
Creatinine, Ser: 1.01 mg/dL (ref 0.76–1.27)
Globulin, Total: 2.3 g/dL (ref 1.5–4.5)
Glucose: 86 mg/dL (ref 70–99)
Potassium: 4.8 mmol/L (ref 3.5–5.2)
Sodium: 140 mmol/L (ref 134–144)
Total Protein: 7.2 g/dL (ref 6.0–8.5)
eGFR: 86 mL/min/{1.73_m2} (ref >59)

## 2023-06-20 LAB — LIPID PANEL
Chol/HDL Ratio: 3 ratio (ref 0.0–5.0)
Cholesterol, Total: 218 mg/dL — ABNORMAL HIGH (ref 100–199)
HDL: 73 mg/dL (ref >39)
LDL Chol Calc (NIH): 134 mg/dL — ABNORMAL HIGH (ref 0–99)
Triglycerides: 61 mg/dL (ref 0–149)
VLDL Cholesterol Cal: 11 mg/dL (ref 5–40)

## 2023-06-20 MED ORDER — ROSUVASTATIN CALCIUM 40 MG PO TABS: 40.0000 mg | ORAL_TABLET | Freq: Every day | ORAL | 3 refills | Status: DC

## 2023-07-08 NOTE — Progress Notes (Signed)
Cardiology Office Note:  .   Date:  07/16/2023  ID:  Karmen Bongo, DOB Jun 11, 1966, MRN [id number] PCP: Deeann Saint, MD  [facility] Providers Cardiologist:  Kristeen Miss, MD Electrophysiologist:  Lewayne Bunting, MD    Patient Profile: .      PMH: Permanent atrial fibrillation CHA2DS2-VASc score 4 Chronic HFrEF Non-ischemic cardiomyopathy cMRI 11/2018 EF 49, no LGE BiV ICD implant 04/17/2021 Echo 12/18/22 LVEF 40-45%, G2DD Hx CVA 2015 Mitral valve prolapse Hyperlipidemia Cardiac catheterization 02/2013 normal coronary arteries Left bundle branch block  He established with cardiology for management of atrial fibrillation and has maintained consistent follow-up with Dr. Elease Hashimoto. Cardiac MRI 11/2018 revealed EF 49%, no evidence of scarring, amloidosis or sarcoidosis. EF 30-35% on echo 02/16/2021. He underwent BiV ICD implant 04/2021. Most recent echo 12/18/22 revealed improved LVEF 40-45%, severe biatrial enlargement, mild LVH, G2DD, and mildly reduced RV function, mild MR, mild dilatation of aortic root at 40 mm.   Seen in clinic on 11/26/2022 by Margaretmary Dys, [facility] for heart failure medication titration. Soft BP limited further medication titration and he was advised to continue Farxiga, Toprol, and losartan.   Seen by me on 06/19/23 for evaluation of fatigue and increased shortness of breath. Reported 3-4 month history of increased symptoms. Occasionally notes a metallic taste and notes presence of device, relates it to "feels like I'm biting something metal." Went on a 3-day golf trip recently and was very fatigued by the end. Also has shoulder and knee pain. Needs shoulder replacement soon. Has felt discomfort in lower abdomen for several months, possibly timing coincides with starting [place]. Occasionally notes a little blood clot in stool. Has not been exercising consistently recently. Tries to avoid high sodium foods but admits he does not like fruits and vegetables. Job is stressful,  would like to retire soon. Does not monitor weight or BP at home. Sometimes feels heavier but no significant swelling.  Remote device check 04/16/2023 revealed questionable NSVT felt likely to be A-fib with rapid response, poor rate control per Dr. Ladona Ridgel.  We increased his beta-blocker therapy with 25 mg Toprol XL every morning and 50 mg at bedtime.  Advised him to hold Farxiga to see if abdominal pain improved off the medication.  On lab work, LDL remains elevated at 134. He agreed to increase rosuvastatin to 40 mg daily and continue ezetimibe.        History of Present Illness: .   Naiara Lombardozzi Bova is a very pleasant 57 y.o. male here today for follow-up for recent medication adjustments on 06/19/23. He reports feeling generally less fatigued and is waking up feeling more rested since we increased metoprolol to 25 mg every AM and 50 mg every PM.  His abdominal pain has resolved since stopping [place]. He continues to work on better diet and walking more for exercise. He has occasional shortness of breath but no DOE, chest pain, lower extremity edema, palpitations, presyncope, syncope, orthopnea, or PND. Blood in stool is less. He does not have any particular triggers for a fib. Wonders why it occurs at times and not others. Drinks one cup of coffee daily, occasional beer while playing golf. Does not have symptoms of sleep apnea.   ROS: See HPI        Studies Reviewed: .         Risk Assessment/Calculations:    CHA2DS2-VASc Score = 4   This indicates a 4.8% annual risk of stroke. The patient's score is based upon: CHF History: 1  HTN History: 1 Diabetes History: 0 Stroke History: 2 Vascular Disease History: 0 Age Score: 0 Gender Score: 0            Physical Exam:   VS:  BP 118/82   Pulse 70   Ht 6\' 2"  (1.88 m)   Wt 215 lb (97.5 kg)   SpO2 98%   BMI 27.60 kg/m    Wt Readings from Last 3 Encounters:  07/16/23 215 lb (97.5 kg)  06/19/23 214 lb (97.1 kg)  11/26/22 217 lb (98.4 kg)     GEN: Well nourished, well developed in no acute distress NECK: No JVD; No carotid bruits CARDIAC: RRR, no murmurs, rubs, gallops RESPIRATORY:  Clear to auscultation without rales, wheezing or rhonchi  ABDOMEN: Soft, non-tender, non-distended EXTREMITIES:  No edema; No deformity     ASSESSMENT AND PLAN: .    Longstanding atrial fibrillation on chronic anticoagulation: HR is well controlled. Clinically appears to be maintaining sinus rhythm today. We increased his Toprol XL at office visit on 06/19/23 to 25 mg every morning and 50 mg every evening due to recent remote device check that revealed poorly controlled ventricular rate per Dr. Ladona Ridgel. Since that time, he reports he is feeling less fatigued and wakes up feeling rested. Has been having blood in stool which has improved recently. We will check CBC today. Continue Xarelto for stroke prevention for stroke prevention for CHA2DS2-VASc score of 4. Continue Toprol for rate control.   Chronic HFrEF: LVEF 40-45%, G2DD on echo 12/18/22. Occasional shortness of breath, but overall feels well and tolerates activity without significant symptoms. No edema, orthopnea, PND.  Appears euvolemic on exam. Did not tolerate Farxiga due to abdominal pain. Has been walking more for exercise and consuming less dietary sodium. We discussed additional GDMT for mildly reduced LVEF, however he is overall feeling well and would like to hold off for now.  Renal function stable on lab work completed 06/19/23.    Hypertension: BP is well controlled. He feels well on current therapy. As noted above, we can consider increase in losartan or addition of MRA if BP is elevated in the future. Renal function and electrolytes stable on labs completed 06/19/23.   Hyperlipidemia: LDL 134 on 06/19/23. He wanted to increase rosuvastatin to 40 mg prior to trying non-statin alternative. He will return in September for repeat lipid panel.   QT prolongation: QTc stable on EKG 06/19/23.  He is  asymptomatic.   ICD implant: S/p BiV ICD implant 04/2021. On remote device check 04/16/23, he had normal device function with questionable AF with rapid response versus NSVT per Dr. Ladona Ridgel. We increased Toprol XL to 75 mg daily (25 mg in the AM and 50 mg in the PM) and he reports feeling less fatigued and less SOB. Due for remote device check 07/17/23 and office visit with EP provider 09/2023.        Dispo: October with EP provider/6 months with Dr. Elease Hashimoto or me  Signed, Eligha Bridegroom, NP-C

## 2023-07-10 ENCOUNTER — Other Ambulatory Visit: Payer: Self-pay | Admitting: Internal Medicine

## 2023-07-10 ENCOUNTER — Other Ambulatory Visit: Payer: Self-pay | Admitting: Cardiovascular Disease

## 2023-07-10 DIAGNOSIS — I482 Chronic atrial fibrillation, unspecified: Secondary | ICD-10-CM

## 2023-07-10 NOTE — Telephone Encounter (Signed)
Xarelto 20mg  refill request received. Pt is 57  years old, weight-97.1kg, Crea-1.01 on 06/19/23, last seen by Eligha Bridegroom on 06/19/23, Diagnosis-Afib, CrCl-109.49 mL/min; Dose is appropriate based on dosing criteria. Will send in refill to requested pharmacy.

## 2023-07-16 ENCOUNTER — Ambulatory Visit: Payer: BC Managed Care – PPO | Admitting: Nurse Practitioner

## 2023-07-16 ENCOUNTER — Encounter: Payer: Self-pay | Admitting: Nurse Practitioner

## 2023-07-16 ENCOUNTER — Ambulatory Visit (INDEPENDENT_AMBULATORY_CARE_PROVIDER_SITE_OTHER): Payer: BC Managed Care – PPO

## 2023-07-16 VITALS — BP 118/82 | HR 70 | Ht 74.0 in | Wt 215.0 lb

## 2023-07-16 DIAGNOSIS — E785 Hyperlipidemia, unspecified: Secondary | ICD-10-CM

## 2023-07-16 DIAGNOSIS — Z79899 Other long term (current) drug therapy: Secondary | ICD-10-CM

## 2023-07-16 DIAGNOSIS — I447 Left bundle-branch block, unspecified: Secondary | ICD-10-CM

## 2023-07-16 DIAGNOSIS — I5022 Chronic systolic (congestive) heart failure: Secondary | ICD-10-CM

## 2023-07-16 DIAGNOSIS — I482 Chronic atrial fibrillation, unspecified: Secondary | ICD-10-CM | POA: Diagnosis not present

## 2023-07-16 LAB — CUP PACEART REMOTE DEVICE CHECK
Battery Remaining Longevity: 80 mo
Battery Remaining Percentage: 75 %
Battery Voltage: 2.99 V
Brady Statistic RA Percent Paced: 76 %
Date Time Interrogation Session: 20240806020151
HighPow Impedance: 68 Ohm
Implantable Lead Connection Status: 753985
Implantable Lead Connection Status: 753985
Implantable Lead Connection Status: 753985
Implantable Lead Implant Date: 20220509
Implantable Lead Implant Date: 20220509
Implantable Lead Implant Date: 20220509
Implantable Lead Location: 753858
Implantable Lead Location: 753860
Implantable Lead Location: 753860
Implantable Lead Model: 3830
Implantable Pulse Generator Implant Date: 20220509
Lead Channel Impedance Value: 500 Ohm
Lead Channel Impedance Value: 630 Ohm
Lead Channel Pacing Threshold Amplitude: 0.75 V
Lead Channel Pacing Threshold Pulse Width: 0.4 ms
Lead Channel Sensing Intrinsic Amplitude: 12 mV
Lead Channel Sensing Intrinsic Amplitude: 5 mV
Lead Channel Setting Pacing Amplitude: 2.5 V
Lead Channel Setting Sensing Sensitivity: 0.5 mV
Pulse Gen Serial Number: 810027397
Zone Setting Status: 755011

## 2023-07-16 LAB — CBC
Hematocrit: 41.3 % (ref 37.5–51.0)
Hemoglobin: 14.5 g/dL (ref 13.0–17.7)
MCH: 33.5 pg — ABNORMAL HIGH (ref 26.6–33.0)
MCHC: 35.1 g/dL (ref 31.5–35.7)
MCV: 95 fL (ref 79–97)
Platelets: 158 10*3/uL (ref 150–450)
RBC: 4.33 x10E6/uL (ref 4.14–5.80)
RDW: 13.5 % (ref 11.6–15.4)
WBC: 5 10*3/uL (ref 3.4–10.8)

## 2023-07-16 NOTE — Patient Instructions (Signed)
Medication Instructions:   Your physician recommends that you continue on your current medications as directed. Please refer to the Current Medication list given to you today.   *If you need a refill on your cardiac medications before your next appointment, please call your pharmacy*   Lab Work:  TODAY!!!!! CBC  If you have labs (blood work) drawn today and your tests are completely normal, you will receive your results only by: MyChart Message (if you have MyChart) OR A paper copy in the mail If you have any lab test that is abnormal or we need to change your treatment, we will call you to review the results.   Testing/Procedures:  None ordered.   Follow-Up: At [facility], you and your health needs are our priority.  As part of our continuing mission to provide you with exceptional heart care, we have created designated Provider Care Teams.  These Care Teams include your primary Cardiologist (physician) and Advanced Practice Providers (APPs -  Physician Assistants and Nurse Practitioners) who all work together to provide you with the care you need, when you need it.  We recommend signing up for the patient portal called "MyChart".  Sign up information is provided on this After Visit Summary.  MyChart is used to connect with patients for Virtual Visits (Telemedicine).  Patients are able to view lab/test results, encounter notes, upcoming appointments, etc.  Non-urgent messages can be sent to your provider as well.   To learn more about what you can do with MyChart, go to [email or web address].    Your next appointment:   6 month(s)  Provider:   Kristeen Miss, MD or Eligha Bridegroom, NP    Other Instructions  Your physician wants you to follow-up in: 6 months.  You will receive a reminder letter in the mail two months in advance. If you don't receive a letter, please call our office to schedule the follow-up appointment.

## 2023-07-31 ENCOUNTER — Ambulatory Visit: Payer: BC Managed Care – PPO | Admitting: Family Medicine

## 2023-07-31 ENCOUNTER — Encounter: Payer: Self-pay | Admitting: Family Medicine

## 2023-07-31 VITALS — BP 122/80 | HR 70 | Temp 98.6°F | Ht 74.0 in | Wt 211.8 lb

## 2023-07-31 DIAGNOSIS — K5790 Diverticulosis of intestine, part unspecified, without perforation or abscess without bleeding: Secondary | ICD-10-CM

## 2023-07-31 DIAGNOSIS — K625 Hemorrhage of anus and rectum: Secondary | ICD-10-CM | POA: Diagnosis not present

## 2023-07-31 DIAGNOSIS — K641 Second degree hemorrhoids: Secondary | ICD-10-CM

## 2023-07-31 DIAGNOSIS — R42 Dizziness and giddiness: Secondary | ICD-10-CM

## 2023-07-31 DIAGNOSIS — R630 Anorexia: Secondary | ICD-10-CM | POA: Diagnosis not present

## 2023-07-31 DIAGNOSIS — Z7901 Long term (current) use of anticoagulants: Secondary | ICD-10-CM

## 2023-07-31 DIAGNOSIS — R140 Abdominal distension (gaseous): Secondary | ICD-10-CM

## 2023-07-31 NOTE — Progress Notes (Signed)
Established Patient Office Visit   Subjective  Patient ID: Dennis Cortez, male    DOB: Jun 27, 1966  Age: 57 y.o. MRN: [id number]  Chief Complaint  Patient presents with   Abdominal Pain    Patient came in today for Stomach pain and bloating, patient has also been having blood in diarrhea, clots , started 6 months ago     Patient is a 57 year old male seen for acute concern.  Patient endorses intermittent BRBPR x 6 mos.  Patient is on Xarelto for A-fib.  Last episode was several months ago after starting [place].  At the time patient developed abdominal cramping, loose stools, blood in stools which solved after cessation of medicine.  This week patient notes 2 episodes of bloody BMs, abdominal bloating, and lightheadedness.  States light red blood was dripping from rectum.  Patient also mentions nausea x 9 months and decreased appetite x 1 year.  Patient will take several medications first thing in the morning without eating.  Patient denies hemorrhoids, burning or itching of rectum, changes in diet, decreased or absent flatus, fever, chills, emesis.  Most recent med change includes taking metoprolol 25 mg in a.m. and 50 mg in p.m.    Patient Active Problem List   Diagnosis Date Noted   Biventricular ICD (implantable cardioverter-defibrillator) in place 07/21/2021   Left bundle branch block 03/15/2021   Chronic systolic heart failure (HCC) 01/23/2021   FHx: melanoma 09/18/2018   Chronic anticoagulation 09/15/2015   Esophageal reflux 09/15/2015   Migraine without aura and without status migrainosus, not intractable 05/19/2015   Cardioembolic stroke (HCC) 05/19/2015   Generalized anxiety disorder 05/19/2015   ATRIAL FIBRILLATION  01/06/2008   Hyperlipemia 10/22/2007   MITRAL VALVE PROLAPSE 10/22/2007   Past Surgical History:  Procedure Laterality Date   BIV ICD INSERTION CRT-D N/A 04/17/2021   Procedure: BIV ICD INSERTION CRT-D;  Surgeon: Marinus Maw, MD;  Location: [facility] INVASIVE CV  LAB;  Service: Cardiovascular;  Laterality: N/A;   KNEE SURGERY     US ECHOCARDIOGRAPHY  09/01/2008   EF 55-60%   US ECHOCARDIOGRAPHY  08/17/2005   EF 55-60%   Social History   Tobacco Use   Smoking status: Never   Smokeless tobacco: Never  Vaping Use   Vaping status: Never Used  Substance Use Topics   Alcohol use: Yes    Alcohol/week: 0.0 standard drinks of alcohol    Comment: 2 beers per day   Drug use: No   Family History  Adopted: Yes   No Known Allergies    ROS Negative unless stated above    Objective:     BP 122/80 (BP Location: Right Arm, Patient Position: Sitting, Cuff Size: Normal)   Pulse 70   Temp 98.6 F (37 C) (Oral)   Ht 6\' 2"  (1.88 m)   Wt 211 lb 12.8 oz (96.1 kg)   SpO2 98%   BMI 27.19 kg/m  BP Readings from Last 3 Encounters:  07/31/23 122/80  07/16/23 118/82  06/19/23 118/80   Wt Readings from Last 3 Encounters:  07/31/23 211 lb 12.8 oz (96.1 kg)  07/16/23 215 lb (97.5 kg)  06/19/23 214 lb (97.1 kg)      Physical Exam Constitutional:      General: He is not in acute distress.    Appearance: Normal appearance.  HENT:     Head: Normocephalic and atraumatic.     Nose: Nose normal.     Mouth/Throat:     Mouth: Mucous membranes are  moist.  Cardiovascular:     Rate and Rhythm: Normal rate and regular rhythm.     Heart sounds: Normal heart sounds. No murmur heard.    No gallop.  Pulmonary:     Effort: Pulmonary effort is normal. No respiratory distress.     Breath sounds: Normal breath sounds. No wheezing, rhonchi or rales.  Abdominal:     General: Bowel sounds are normal.     Palpations: Abdomen is soft.     Tenderness: There is abdominal tenderness in the right lower quadrant and left lower quadrant.  Genitourinary:    Rectum: Guaiac result negative. External hemorrhoid and internal hemorrhoid present.  Skin:    General: Skin is warm and dry.  Neurological:     Mental Status: He is alert and oriented to person, place, and time.       No results found for any visits on 07/31/23.    Assessment & Plan:  BRBPR (bright red blood per rectum) -Patient notes several episodes of BRBPR over several months.  Concerning his pt on Xarelto. -Discussed various causes of symptoms including hemorrhoids, fissure, other GIB, malignancy. -Hemoccult negative -Will obtain labs to evaluate for anemia -GI referral placed.       -     CBC with Differential/Platelet -     Iron, TIBC and Ferritin Panel -     Ambulatory referral to Gastroenterology  Lightheadedness -Advised likely multifactorial including anemia, hypoglycemia, medications, thyroid dysfunction, etc. -Will obtain labs to further evaluate. -Patient encouraged to take medication with a little food. -Hydration also encouraged. -     CBC with Differential/Platelet -     TSH -     T4, free -     Iron, TIBC and Ferritin Panel  Decreased appetite -Patient encouraged to try taking medications with foods to help cut down on nausea. -Consider eating smaller meals throughout the day and/or snacks. -Monitor weight -     Comprehensive metabolic panel -     TSH -     T4, free -     Ambulatory referral to Gastroenterology  Diverticulosis -Colonoscopy done 11/24/2015 with mild diverticulosis in the sigmoid colon.  Repeat colonoscopy in 10 years, 2026. -     Ambulatory referral to Gastroenterology  Abdominal bloating -     Comprehensive metabolic panel -     Ambulatory referral to Gastroenterology  Chronic anticoagulation -Continue Xarelto due to A-fib -     Ambulatory referral to Gastroenterology  Grade II hemorrhoids -External and no hemorrhoids noted on exam. -Discussed OTC treatment options if develops symptoms.  Given strict precautions.  Return if symptoms worsen or fail to improve.   Deeann Saint, MD

## 2023-08-01 LAB — COMPREHENSIVE METABOLIC PANEL
ALT: 44 U/L (ref 0–53)
AST: 36 U/L (ref 0–37)
Albumin: 4.8 g/dL (ref 3.5–5.2)
Alkaline Phosphatase: 57 U/L (ref 39–117)
BUN: 10 mg/dL (ref 6–23)
CO2: 29 meq/L (ref 19–32)
Calcium: 9.8 mg/dL (ref 8.4–10.5)
Chloride: 102 meq/L (ref 96–112)
Creatinine, Ser: 1 mg/dL (ref 0.40–1.50)
GFR: 82.94 mL/min (ref >60.00)
Glucose, Bld: 92 mg/dL (ref 70–99)
Potassium: 4.1 meq/L (ref 3.5–5.1)
Sodium: 139 meq/L (ref 135–145)
Total Bilirubin: 1 mg/dL (ref 0.2–1.2)
Total Protein: 7.4 g/dL (ref 6.0–8.3)

## 2023-08-01 LAB — CBC WITH DIFFERENTIAL/PLATELET
Basophils Absolute: 0.1 10*3/uL (ref 0.0–0.1)
Basophils Relative: 1.3 % (ref 0.0–3.0)
Eosinophils Absolute: 0.2 10*3/uL (ref 0.0–0.7)
Eosinophils Relative: 2.6 % (ref 0.0–5.0)
HCT: 42.2 % (ref 39.0–52.0)
Hemoglobin: 14 g/dL (ref 13.0–17.0)
Lymphocytes Relative: 35.2 % (ref 12.0–46.0)
Lymphs Abs: 2.2 10*3/uL (ref 0.7–4.0)
MCHC: 33.3 g/dL (ref 30.0–36.0)
MCV: 99.1 fl (ref 78.0–100.0)
Monocytes Absolute: 0.6 10*3/uL (ref 0.1–1.0)
Monocytes Relative: 9.7 % (ref 3.0–12.0)
Neutro Abs: 3.2 10*3/uL (ref 1.4–7.7)
Neutrophils Relative %: 51.2 % (ref 43.0–77.0)
Platelets: 170 10*3/uL (ref 150.0–400.0)
RBC: 4.25 Mil/uL (ref 4.22–5.81)
RDW: 13.2 % (ref 11.5–15.5)
WBC: 6.3 10*3/uL (ref 4.0–10.5)

## 2023-08-01 LAB — IRON,TIBC AND FERRITIN PANEL
%SAT: 31 % (calc) (ref 20–48)
Ferritin: 211 ng/mL (ref 38–380)
Iron: 93 ug/dL (ref 50–180)
TIBC: 299 ug/dL (ref 250–425)

## 2023-08-01 NOTE — Progress Notes (Signed)
Remote ICD transmission.   

## 2023-08-02 ENCOUNTER — Encounter: Payer: Self-pay | Admitting: Family Medicine

## 2023-08-02 LAB — TSH: TSH: 4.61 u[IU]/mL (ref 0.35–5.50)

## 2023-08-02 LAB — T4, FREE: Free T4: 0.87 ng/dL (ref 0.60–1.60)

## 2023-08-06 ENCOUNTER — Encounter: Payer: Self-pay | Admitting: Physician Assistant

## 2023-08-15 ENCOUNTER — Other Ambulatory Visit: Payer: Self-pay | Admitting: Family Medicine

## 2023-08-15 DIAGNOSIS — Z8669 Personal history of other diseases of the nervous system and sense organs: Secondary | ICD-10-CM

## 2023-08-15 DIAGNOSIS — F419 Anxiety disorder, unspecified: Secondary | ICD-10-CM

## 2023-08-20 ENCOUNTER — Other Ambulatory Visit: Payer: Self-pay | Admitting: Family Medicine

## 2023-08-20 DIAGNOSIS — Z8669 Personal history of other diseases of the nervous system and sense organs: Secondary | ICD-10-CM

## 2023-08-20 DIAGNOSIS — F419 Anxiety disorder, unspecified: Secondary | ICD-10-CM

## 2023-08-20 MED ORDER — VENLAFAXINE HCL ER 75 MG PO CP24
75.0000 mg | ORAL_CAPSULE | Freq: Every day | ORAL | 0 refills | Status: DC
Start: 2023-08-20 — End: 2023-11-21

## 2023-08-20 NOTE — Telephone Encounter (Signed)
Pt last seen dr banks on 07-31-2023

## 2023-08-27 ENCOUNTER — Ambulatory Visit: Payer: BC Managed Care – PPO | Attending: Cardiovascular Disease

## 2023-08-27 DIAGNOSIS — E785 Hyperlipidemia, unspecified: Secondary | ICD-10-CM

## 2023-09-16 ENCOUNTER — Other Ambulatory Visit: Payer: Self-pay | Admitting: Orthopedic Surgery

## 2023-09-16 DIAGNOSIS — M19012 Primary osteoarthritis, left shoulder: Secondary | ICD-10-CM

## 2023-09-17 ENCOUNTER — Telehealth: Payer: Self-pay | Admitting: *Deleted

## 2023-09-17 NOTE — Telephone Encounter (Signed)
   Name: Hoyle Barkdull  DOB: 1966-09-08  MRN: [id number]  Primary Cardiologist: Kristeen Miss, MD  Chart reviewed as part of pre-operative protocol coverage. The patient has an upcoming visit scheduled with Dr. Ladona Ridgel on 09/23/2023 at which time clearance can be addressed in case there are any issues that would impact surgical recommendations.  Left shoulder arthroplasty Is not scheduled until TBD as below. I added preop FYI to appointment note so that provider is aware to address at time of outpatient visit.  Per office protocol the cardiology provider should forward their finalized clearance decision and recommendations regarding antiplatelet therapy to the requesting party below.    Per Pharmacy Per office protocol, patient can hold Xarelto for 3 days prior to procedure.      I will route this message as FYI to requesting party and remove this message from the preop box as separate preop APP input not needed at this time.   Please call with any questions.  Joni Reining, NP  09/17/2023, 4:11 PM

## 2023-09-17 NOTE — Telephone Encounter (Signed)
Pharmacy please advise on holding rivaroxaban  prior to Left Total Shoulder Arthroplasty scheduled for TBD. Thank you.  He has appt scheduled with Dr. Ladona Ridgel on September 23, 2023.

## 2023-09-17 NOTE — Telephone Encounter (Signed)
   Pre-operative Risk Assessment    Patient Name: Dennis Cortez  DOB: [birth year] MRN: [id number]      Request for Surgical Clearance    Procedure:   Left Total Shoulder Arthroplasty  Date of Surgery:  Clearance TBD                                 Surgeon:  Dr. Francena Hanly Surgeon's Group or Practice Name:  [facility] Phone number:  [phone] Fax number:  [phone]   Type of Clearance Requested:   - Medical  - Pharmacy:  Hold Rivaroxaban (Xarelto) Not indicated.   Type of Anesthesia:  General    Additional requests/questions:  Patient has upcoming ov with Dr. Ladona Ridgel on October 14.  Pre op added to appt notes.   Signed, Emmit Pomfret   09/17/2023, 11:24 AM

## 2023-09-17 NOTE — Telephone Encounter (Signed)
Patient with diagnosis of afib on Xarelto for anticoagulation.    Procedure: Left Total Shoulder Arthroplasty  Date of procedure: TBD   CHA2DS2-VASc Score = 4   This indicates a 4.8% annual risk of stroke. The patient's score is based upon: CHF History: 1 HTN History: 1 Diabetes History: 0 Stroke History: 2 Vascular Disease History: 0 Age Score: 0 Gender Score: 0       Pt w/ hx of stroke in 2015  CrCl 93 ml/min Platelet count 170  Per office protocol, patient can hold Xarelto for 3 days prior to procedure.    **This guidance is not considered finalized until pre-operative APP has relayed final recommendations.**

## 2023-09-20 ENCOUNTER — Ambulatory Visit
Admission: RE | Admit: 2023-09-20 | Discharge: 2023-09-20 | Disposition: A | Discharge status: Home / Self Care | Payer: BC Managed Care – PPO | Source: Ambulatory Visit | Attending: Orthopedic Surgery | Admitting: Orthopedic Surgery

## 2023-09-20 DIAGNOSIS — M19012 Primary osteoarthritis, left shoulder: Secondary | ICD-10-CM

## 2023-09-23 ENCOUNTER — Encounter: Payer: Self-pay | Admitting: Internal Medicine

## 2023-09-23 ENCOUNTER — Ambulatory Visit: Payer: BC Managed Care – PPO | Attending: Internal Medicine | Admitting: Internal Medicine

## 2023-09-23 VITALS — BP 116/78 | HR 70 | Ht 74.0 in | Wt 214.0 lb

## 2023-09-23 DIAGNOSIS — I482 Chronic atrial fibrillation, unspecified: Secondary | ICD-10-CM | POA: Diagnosis not present

## 2023-09-23 NOTE — Patient Instructions (Signed)

## 2023-09-23 NOTE — Progress Notes (Signed)
HPI [name] returns today for evaluation a non-ischemic CM, LBBB and chronic atrial fib s/p biv ICD insertion. He had a left bundle area lead placed in the RA port of his device. He had class 2 CHF. He has improved nicely since his procedure. He has not had syncope. He has had progressive LV dysfunction and EF is now 30-35%. His QRS duration has decreased from 160 to 130 ms. he has done well in the interim except for left shoulder pain and he is pending a shoulder replacement in several weeks.  No Known Allergies   Current Outpatient Medications  Medication Sig Dispense Refill   acetaminophen (TYLENOL) 500 MG tablet Take 500 mg by mouth daily as needed (pain.).     colchicine 0.6 MG tablet TAKE 2 TABS AT FIRST FIGN OF GOUT, MAY REPEAT 1 TAB IN AN HOUR. TAKE DAILY AFTER FOR UP TO 10 DAYS 30 tablet 1   ezetimibe (ZETIA) 10 MG tablet TAKE 1 TABLET (10 MG TOTAL) BY MOUTH DAILY. PATIENT NEEDS TO KEEP APPOINTMENT IN [facility]IL FOR ANY FUTURE REFILLS. 30 tablet 0   fish oil-omega-3 fatty acids 1000 MG capsule Take 1 g by mouth in the morning.     losartan (COZAAR) 25 MG tablet TAKE 1 TABLET (25 MG TOTAL) BY MOUTH DAILY. 90 tablet 3   metoprolol succinate (TOPROL-XL) 25 MG 24 hr tablet TAKE 1 TABLET (25 MG TOTAL) BY MOUTH DAILY. 90 tablet 3   metoprolol succinate (TOPROL-XL) 50 MG 24 hr tablet Take 50 mg by mouth at bedtime.     Multiple Vitamin (MULTIVITAMIN) tablet Take 1 tablet by mouth in the morning.     rosuvastatin (CRESTOR) 40 MG tablet Take 1 tablet (40 mg total) by mouth daily. 90 tablet 3   venlafaxine XR (EFFEXOR-XR) 75 MG 24 hr capsule Take 1 capsule (75 mg total) by mouth daily. 90 capsule 0   XARELTO 20 MG TABS tablet TAKE 1 TABLET BY MOUTH EVERY DAY WITH SUPPER 90 tablet 1   No current facility-administered medications for this visit.     Past Medical History:  Diagnosis Date   Atrial fibrillation (HCC)    Cardioembolic stroke (HCC) 05/19/2015   Chronic anticoagulation  09/15/2015   COVID    Esophageal reflux 09/15/2015   Generalized anxiety disorder 05/19/2015   H/O exercise stress test 02/2013   a. ETT-Echo with normal images but significant ST depression and hypotensive response to exercise   Hx of cardiac catheterization    LHC 03/09/13:  Smooth and normal coronary arteries, normal LVF   Hyperlipemia 10/22/2007   Qualifier: Diagnosis of  By: Alphonzo Severance MD, Loni Dolly    Hyperlipidemia    LVH (left ventricular hypertrophy)    a. Echo 02/2013:  Mild LVH, EF 55-65%, normal wall motion, mild LAE, mild RVE, mild RAE    Migraine without aura and without status migrainosus, not intractable 05/19/2015   MVP (mitral valve prolapse)     ROS:   All systems reviewed and negative except as noted in the HPI.   Past Surgical History:  Procedure Laterality Date   BIV ICD INSERTION CRT-D N/A 04/17/2021   Procedure: BIV ICD INSERTION CRT-D;  Surgeon: Marinus Maw, MD;  Location: [facility] INVASIVE CV LAB;  Service: Cardiovascular;  Laterality: N/A;   KNEE SURGERY     US ECHOCARDIOGRAPHY  09/01/2008   EF 55-60%   US ECHOCARDIOGRAPHY  08/17/2005   EF 55-60%     Family History  Adopted: Yes     Social History   Socioeconomic History   Marital status: Married    Spouse name: Not on file   Number of children: 0   Years of education: Not on file   Highest education level: Associate degree: academic program  Occupational History   Not on file  Tobacco Use   Smoking status: Never   Smokeless tobacco: Never  Vaping Use   Vaping status: Never Used  Substance and Sexual Activity   Alcohol use: Yes    Alcohol/week: 0.0 standard drinks of alcohol    Comment: 2 beers per day   Drug use: No   Sexual activity: Yes    Partners: Female  Other Topics Concern   Not on file  Social History Narrative   Not on file   Social Determinants of Health   Financial Resource Strain: Not on file  Food Insecurity: Not on file  Transportation Needs: Not on file  Physical  Activity: Not on file  Stress: Not on file  Social Connections: Not on file  Intimate Partner Violence: Not on file     BP 116/78   Pulse 70   Ht 6\' 2"  (1.88 m)   Wt 214 lb (97.1 kg)   SpO2 99%   BMI 27.48 kg/m   Physical Exam:  Well appearing NAD HEENT: Unremarkable Neck:  No JVD, no thyromegally Lymphatics:  No adenopathy Back:  No CVA tenderness Lungs:  Clear with no wheezes HEART:  Regular rate rhythm, no murmurs, no rubs, no clicks Abd:  soft, positive bowel sounds, no organomegally, no rebound, no guarding Ext:  2 plus pulses, no edema, no cyanosis, no clubbing Skin:  No rashes no nodules Neuro:  CN II through XII intact, motor grossly intact  EKG - atrial fib with ventricular pacing  DEVICE  Normal device function.  See PaceArt for details.   Assess/Plan:  ICD - his [employer] Biv ICD is working normally. He is pacing in the AAI mode with pacing from the left bundle area and a QRS which reduced from 160 to 130.  Chronic systolic heart failure - his symptoms appear to be class 1.  Atrial fib - he had a very few RVR's. I encouraged him to increase his activity but not too fast.  Coags -he has not had any bleeding on Xarelto. Continue Preop eval - he is an acceptable surgical risk for shoulder replacement. His Xarelto can be stopped 3 days prior to the surgery. The half life of xarelto is 9-11 hours. Restart xarelto after surgery when the bleeding risk is acceptable.     Sharlot Gowda Nabilah Davoli,MD

## 2023-09-27 ENCOUNTER — Ambulatory Visit (INDEPENDENT_AMBULATORY_CARE_PROVIDER_SITE_OTHER): Payer: BC Managed Care – PPO | Admitting: Family Medicine

## 2023-09-27 ENCOUNTER — Encounter: Payer: Self-pay | Admitting: Family Medicine

## 2023-09-27 VITALS — BP 108/80 | HR 69 | Temp 99.0°F | Ht 74.0 in | Wt 213.8 lb

## 2023-09-27 DIAGNOSIS — Z01818 Encounter for other preprocedural examination: Secondary | ICD-10-CM

## 2023-09-27 NOTE — Progress Notes (Signed)
Chief Complaint  Patient presents with   Pre-op Exam    Pt will have L shoulder replacement on November 7    HPI:  Patient is seen for optimization of general medical care prior to surgery. Surgery type: Left total shoulder arthroplasty Date of surgery: 10/17/2023  Patient states left shoulder has been bothering him for years but getting worse.  Feels like it could dislocate with certain movements.  Kidney disease?  No Prior surgeries/Issues following anesthesia?  No Hx MI, heart arrythmia, CHF, angina or stroke?  Yes.  A-fib with ICD in place, chronic systolic heart failure, left bundle branch block, history of cardioembolic stroke, mitral valve disease Epilepsy or Seizures? None  Arthritis or problems with neck or jaw? None Thyroid disease? None Liver disease? None Asthma, COPD or chronic lung disease? None Diabetes? None (Needs to be evaluated by anesthesia if yes to these questions.)  Other: Poor nutrition, Frail or other: no  METS: ?Can take care of self, such as eat, dress, or use the toilet (1 MET). yes ?Can walk up a flight of steps or a hill (4 METs).yes ?Can do heavy work around the house such as scrubbing floors or lifting or moving heavy furniture (between 4 and 10 METs). yes ?Can participate in strenuous sports such as swimming, singles tennis, football, basketball, and skiing (>10 METs) . AHA Risks: Major predictors that require intensive management and may lead to delay in or cancellation of the operative procedure unless emergent: NONE   Unstable coronary syndromes including unstable or severe angina or recent MI   Decompensated heart failure including NYHA functional class IV or worsening or new-onset HF   Significant arrhythmias including high grade AV block, symptomatic ventricular arrhythmias, supraventricular arrhythmias with ventricular rate >100 bpm at rest, symptomatic bradycardia, and newly recognized ventricular tachycardia   Severe heart valve disease  including severe aortic stenosis or symptomatic mitral stenosis   Other clinical predictors that warrant careful assessment of current status: NONE   History of ischemic heart disease  History of cerebrovascular disease   History of compensated heart failure or prior heart failure   Diabetes mellitus   Renal insufficiency  Type of surgery and Risk: 1) High risk (reported risk of cardiac death or nonfatal myocardial infarction [MI] often greater than 5 percent):   Aortic and other major vascular surgery   Peripheral artery surgery   2)Intermediate risk (reported risk of cardiac death or nonfatal MI generally 1 to 5 percent):   Carotid endarterectomy   Head and neck surgery   Intraperitoneal and intrathoracic surgery   Orthopedic surgery   Prostate surgery   3)Low risk (reported risk of cardiac death or nonfatal MI generally less than 1 percent):   Ambulatory surgery   Endoscopic procedures   Superficial procedure   Cataract surgery   Breast surgery  Medications that need to be addressed prior to surgery: None Discontinue acei/arbs/non-statin lipid lowering drugs day of surgery ASA stop 7 days before or discuss with cardiology if CV risks, other anticoagulants discuss with cardiology.  ROS: See pertinent positives and negatives per HPI. 11 point ROS negative except where noted.  Past Medical History:  Diagnosis Date   Atrial fibrillation (HCC)    Cardioembolic stroke (HCC) 05/19/2015   Chronic anticoagulation 09/15/2015   COVID    Esophageal reflux 09/15/2015   Generalized anxiety disorder 05/19/2015   H/O exercise stress test 02/2013   a. ETT-Echo with normal images but significant ST depression and hypotensive response to exercise  Hx of cardiac catheterization    LHC 03/09/13:  Smooth and normal coronary arteries, normal LVF   Hyperlipemia 10/22/2007   Qualifier: Diagnosis of  By: Alphonzo Severance MD, Loni Dolly    Hyperlipidemia    LVH (left ventricular hypertrophy)    a. Echo  02/2013:  Mild LVH, EF 55-65%, normal wall motion, mild LAE, mild RVE, mild RAE    Migraine without aura and without status migrainosus, not intractable 05/19/2015   MVP (mitral valve prolapse)     Past Surgical History:  Procedure Laterality Date   BIV ICD INSERTION CRT-D N/A 04/17/2021   Procedure: BIV ICD INSERTION CRT-D;  Surgeon: Marinus Maw, MD;  Location: [facility] INVASIVE CV LAB;  Service: Cardiovascular;  Laterality: N/A;   KNEE SURGERY     US ECHOCARDIOGRAPHY  09/01/2008   EF 55-60%   US ECHOCARDIOGRAPHY  08/17/2005   EF 55-60%    Family History  Adopted: Yes    Social History   Socioeconomic History   Marital status: Married    Spouse name: Not on file   Number of children: 0   Years of education: Not on file   Highest education level: Associate degree: academic program  Occupational History   Not on file  Tobacco Use   Smoking status: Never   Smokeless tobacco: Never  Vaping Use   Vaping status: Never Used  Substance and Sexual Activity   Alcohol use: Yes    Alcohol/week: 0.0 standard drinks of alcohol    Comment: 2 beers per day   Drug use: No   Sexual activity: Yes    Partners: Female  Other Topics Concern   Not on file  Social History Narrative   Not on file   Social Determinants of Health   Financial Resource Strain: Not on file  Food Insecurity: Not on file  Transportation Needs: Not on file  Physical Activity: Not on file  Stress: Not on file  Social Connections: Not on file     Current Outpatient Medications:    acetaminophen (TYLENOL) 500 MG tablet, Take 500 mg by mouth daily as needed (pain.)., Disp: , Rfl:    colchicine 0.6 MG tablet, TAKE 2 TABS AT FIRST FIGN OF GOUT, MAY REPEAT 1 TAB IN AN HOUR. TAKE DAILY AFTER FOR UP TO 10 DAYS, Disp: 30 tablet, Rfl: 1   ezetimibe (ZETIA) 10 MG tablet, TAKE 1 TABLET (10 MG TOTAL) BY MOUTH DAILY. PATIENT NEEDS TO KEEP APPOINTMENT IN [facility]IL FOR ANY FUTURE REFILLS., Disp: 30 tablet, Rfl: 0   fish oil-omega-3  fatty acids 1000 MG capsule, Take 1 g by mouth in the morning., Disp: , Rfl:    losartan (COZAAR) 25 MG tablet, TAKE 1 TABLET (25 MG TOTAL) BY MOUTH DAILY., Disp: 90 tablet, Rfl: 3   metoprolol succinate (TOPROL-XL) 25 MG 24 hr tablet, TAKE 1 TABLET (25 MG TOTAL) BY MOUTH DAILY., Disp: 90 tablet, Rfl: 3   metoprolol succinate (TOPROL-XL) 50 MG 24 hr tablet, Take 50 mg by mouth at bedtime., Disp: , Rfl:    Multiple Vitamin (MULTIVITAMIN) tablet, Take 1 tablet by mouth in the morning., Disp: , Rfl:    rosuvastatin (CRESTOR) 40 MG tablet, Take 1 tablet (40 mg total) by mouth daily., Disp: 90 tablet, Rfl: 3   venlafaxine XR (EFFEXOR-XR) 75 MG 24 hr capsule, Take 1 capsule (75 mg total) by mouth daily., Disp: 90 capsule, Rfl: 0   XARELTO 20 MG TABS tablet, TAKE 1 TABLET BY MOUTH EVERY DAY WITH  SUPPER, Disp: 90 tablet, Rfl: 1  EXAM:  Vitals:   09/27/23 0930  BP: 108/80  Pulse: 69  Temp: 99 F (37.2 C)  SpO2: 98%    Body mass index is 27.45 kg/m.  GENERAL: vitals reviewed and listed above, alert, oriented, appears well hydrated and in no acute distress  HEENT: atraumatic, conjunttiva clear, no obvious abnormalities on inspection of external nose and ears  NECK: no obvious masses on inspection, no carotid bruits  LUNGS: clear to auscultation bilaterally, no wheezes, rales or rhonchi, good air movement  CV: HRRR, no peripheral edema, no JVD, BP normal range, normal radial pulses  MS: moves all extremities without noticeable abnormality  PSYCH: pleasant and cooperative, no obvious depression or anxiety  ASSESSMENT AND PLAN:  Discussed the following assessment and plan:  Preop examination  Assessment: -Risk factors: none -Surgery Risks:intermediate -age, nutritional status, fraility: good nutritional status, age >7, no fraility -functional capacity: > 4 METs wethout symptoms -comorbidities: none Patient Specific Risks: patient is low risk for intermediate risks  surgery   Recommendations for optimizing general medical care prior to surgery: -advised patient to discuss specific risks morbidity and mortality of surgery with surgeon, CV risks discussed with patient -advised patient will defer to surgeon for post-op DVT prophylaxis and post op care -no specific medical recommendations for this patient at this time and no recommendations to defer surgery or for further CV testing prior to surgery -form for pre-op optimization of general medical care prior to surgery faxed to surgeon office  -Patient advised to return or notify a doctor immediately if symptoms worsen or persist or new concerns arise.  Form completed.  Cardiology to evaluate incomplete assessment.   Follow-up as needed  Deeann Saint

## 2023-10-01 ENCOUNTER — Encounter (HOSPITAL_COMMUNITY): Payer: Self-pay | Admitting: Orthopedic Surgery

## 2023-10-02 NOTE — Progress Notes (Signed)
Surgery orders requested via Epic inbox. °

## 2023-10-04 NOTE — Progress Notes (Addendum)
Anesthesia Review:  PCP: Lyda Jester clearance 09/27/23 on chart  LOV - 09/27/23  Cardiologist : DR Lewayne Bunting- LOV  09/23/23  Clearance in note   BIvICD - last device check - 07/16/23  Device located on left anterior chest  Device orders requested on 10/08/23.  Device orders on chart .  Rep paged on 10/08/23. Albesa Seen returned page.  Bryan aware surgery on 11/7/at 1000am.  PT arrival at 0730am.  Bryan to be here between 0730am-0800am.  Bryan aware ICD on left anterior chest and location and type of surgery.   Chest x-ray : EKG : 09/23/23  Echo : 1/9/124  Stress test: Cardiac Cath :  Activity level: can do a flight of stairs without difficutly  Sleep Study/ CPAP : none  Fasting Blood Sugar :      / Checks Blood Sugar -- times a day:   Blood Thinner/ Instructions /Last Dose: ASA / Instructions/ Last Dose :    Xarelto - Last dose on 10/13/2023.

## 2023-10-04 NOTE — Patient Instructions (Signed)
SURGICAL WAITING ROOM VISITATION  Patients having surgery or a procedure may have no more than 2 support people in the waiting area - these visitors may rotate.    Children under the age of 63 must have an adult with them who is not the patient.  Due to an increase in RSV and influenza rates and associated hospitalizations, children ages 75 and under may not visit patients in [facility] hospitals.  If the patient needs to stay at the hospital during part of their recovery, the visitor guidelines for inpatient rooms apply. Pre-op nurse will coordinate an appropriate time for 1 support person to accompany patient in pre-op.  This support person may not rotate.    Please refer to the [facility] website for the visitor guidelines for Inpatients (after your surgery is over and you are in a regular room).       Your procedure is scheduled on:  10/17/2023    Report to [facility] Main Entrance    Report to admitting at   0730AM   Call this number if you have problems the morning of surgery [phone]   Do not eat food :After Midnight.   After Midnight you may have the following liquids until _ 0700_____ AM  DAY OF SURGERY  Water Non-Citrus Juices (without pulp, NO RED-Apple, White grape, White cranberry) Black Coffee (NO MILK/CREAM OR CREAMERS, sugar ok)  Clear Tea (NO MILK/CREAM OR CREAMERS, sugar ok) regular and decaf                             Plain Jell-O (NO RED)                                           Fruit ices (not with fruit pulp, NO RED)                                     Popsicles (NO RED)                                                               Sports drinks like Gatorade (NO RED)                    The day of surgery:  Drink ONE (1) Pre-Surgery Clear Ensure or G2 at  0700 AM  ( have completed by ) the morning of surgery. Drink in one sitting. Do not sip.  This drink was given to you during your hospital  pre-op appointment visit. Nothing else to  drink after completing the  Pre-Surgery Clear Ensure or G2.          If you have questions, please contact your surgeon's office.       Oral Hygiene is also important to reduce your risk of infection.                                    Remember - BRUSH YOUR TEETH THE MORNING OF SURGERY  WITH YOUR REGULAR TOOTHPASTE  DENTURES WILL BE REMOVED PRIOR TO SURGERY PLEASE DO NOT APPLY "Poly grip" OR ADHESIVES!!!   Do NOT smoke after Midnight   Stop all vitamins and herbal supplements 7 days before surgery.   Take these medicines the morning of surgery with A SIP OF WATER:  toprol, effexor   DO NOT TAKE ANY ORAL DIABETIC MEDICATIONS DAY OF YOUR SURGERY  Bring CPAP mask and tubing day of surgery.                              You may not have any metal on your body including hair pins, jewelry, and body piercing             Do not wear make-up, lotions, powders, perfumes/cologne, or deodorant  Do not wear nail polish including gel and S&S, artificial/acrylic nails, or any other type of covering on natural nails including finger and toenails. If you have artificial nails, gel coating, etc. that needs to be removed by a nail salon please have this removed prior to surgery or surgery may need to be canceled/ delayed if the surgeon/ anesthesia feels like they are unable to be safely monitored.   Do not shave  48 hours prior to surgery.               Men may shave face and neck.   Do not bring valuables to the hospital. [facility] IS NOT             RESPONSIBLE   FOR VALUABLES.   Contacts, glasses, dentures or bridgework may not be worn into surgery.   Bring small overnight bag day of surgery.   DO NOT BRING YOUR HOME MEDICATIONS TO THE HOSPITAL. PHARMACY WILL DISPENSE MEDICATIONS LISTED ON YOUR MEDICATION LIST TO YOU DURING YOUR ADMISSION IN THE HOSPITAL!    Patients discharged on the day of surgery will not be allowed to drive home.  Someone NEEDS to stay with you for the first 24 hours  after anesthesia.   Special Instructions: Bring a copy of your healthcare power of attorney and living will documents the day of surgery if you haven't scanned them before.              Please read over the following fact sheets you were given: IF YOU HAVE QUESTIONS ABOUT YOUR PRE-OP INSTRUCTIONS PLEASE CALL [phone]   If you received a COVID test during your pre-op visit  it is requested that you wear a mask when out in public, stay away from anyone that may not be feeling well and notify your surgeon if you develop symptoms. If you test positive for Covid or have been in contact with anyone that has tested positive in the last 10 days please notify you surgeon.      Pre-operative 5 CHG Bath Instructions   You can play a key role in reducing the risk of infection after surgery. Your skin needs to be as free of germs as possible. You can reduce the number of germs on your skin by washing with CHG (chlorhexidine gluconate) soap before surgery. CHG is an antiseptic soap that kills germs and continues to kill germs even after washing.   DO NOT use if you have an allergy to chlorhexidine/CHG or antibacterial soaps. If your skin becomes reddened or irritated, stop using the CHG and notify one of our RNs at [phone].   Please shower with the CHG  soap starting 4 days before surgery using the following schedule:     Please keep in mind the following:  DO NOT shave, including legs and underarms, starting the day of your first shower.   You may shave your face at any point before/day of surgery.  Place clean sheets on your bed the day you start using CHG soap. Use a clean washcloth (not used since being washed) for each shower. DO NOT sleep with pets once you start using the CHG.   CHG Shower Instructions:  If you choose to wash your hair and private area, wash first with your normal shampoo/soap.  After you use shampoo/soap, rinse your hair and body thoroughly to remove shampoo/soap  residue.  Turn the water OFF and apply about 3 tablespoons (45 ml) of CHG soap to a CLEAN washcloth.  Apply CHG soap ONLY FROM YOUR NECK DOWN TO YOUR TOES (washing for 3-5 minutes)  DO NOT use CHG soap on face, private areas, open wounds, or sores.  Pay special attention to the area where your surgery is being performed.  If you are having back surgery, having someone wash your back for you may be helpful. Wait 2 minutes after CHG soap is applied, then you may rinse off the CHG soap.  Pat dry with a clean towel  Put on clean clothes/pajamas   If you choose to wear lotion, please use ONLY the CHG-compatible lotions on the back of this paper.     Additional instructions for the day of surgery: DO NOT APPLY any lotions, deodorants, cologne, or perfumes.   Put on clean/comfortable clothes.  Brush your teeth.  Ask your nurse before applying any prescription medications to the skin.      CHG Compatible Lotions   Aveeno Moisturizing lotion  Cetaphil Moisturizing Cream  Cetaphil Moisturizing Lotion  Clairol Herbal Essence Moisturizing Lotion, Dry Skin  Clairol Herbal Essence Moisturizing Lotion, Extra Dry Skin  Clairol Herbal Essence Moisturizing Lotion, Normal Skin  Curel Age Defying Therapeutic Moisturizing Lotion with Alpha Hydroxy  Curel Extreme Care Body Lotion  Curel Soothing Hands Moisturizing Hand Lotion  Curel Therapeutic Moisturizing Cream, Fragrance-Free  Curel Therapeutic Moisturizing Lotion, Fragrance-Free  Curel Therapeutic Moisturizing Lotion, Original Formula  Eucerin Daily Replenishing Lotion  Eucerin Dry Skin Therapy Plus Alpha Hydroxy Crme  Eucerin Dry Skin Therapy Plus Alpha Hydroxy Lotion  Eucerin Original Crme  Eucerin Original Lotion  Eucerin Plus Crme Eucerin Plus Lotion  Eucerin TriLipid Replenishing Lotion  Keri Anti-Bacterial Hand Lotion  Keri Deep Conditioning Original Lotion Dry Skin Formula Softly Scented  Keri Deep Conditioning Original Lotion,  Fragrance Free Sensitive Skin Formula  Keri Lotion Fast Absorbing Fragrance Free Sensitive Skin Formula  Keri Lotion Fast Absorbing Softly Scented Dry Skin Formula  Keri Original Lotion  Keri Skin Renewal Lotion Keri Silky Smooth Lotion  Keri Silky Smooth Sensitive Skin Lotion  Nivea Body Creamy Conditioning Oil  Nivea Body Extra Enriched Lotion  Nivea Body Original Lotion  Nivea Body Sheer Moisturizing Lotion Nivea Crme  Nivea Skin Firming Lotion  NutraDerm 30 Skin Lotion  NutraDerm Skin Lotion  NutraDerm Therapeutic Skin Cream  NutraDerm Therapeutic Skin Lotion  ProShield Protective Hand Cream  Provon moisturizing lotion   [place]- Preparing for Total Shoulder Arthroplasty    Before surgery, you can play an important role. Because skin is not sterile, your skin needs to be as free of germs as possible. You can reduce the number of germs on your skin by using the following products.  Benzoyl Peroxide Gel Reduces the number of germs present on the skin Applied twice a day to shoulder area starting two days before surgery    ==================================================================  Please follow these instructions carefully:  BENZOYL PEROXIDE 5% GEL  Please do not use if you have an allergy to benzoyl peroxide.   If your skin becomes reddened/irritated stop using the benzoyl peroxide.  Starting two days before surgery, apply as follows: Apply benzoyl peroxide in the morning and at night. Apply after taking a shower. If you are not taking a shower clean entire shoulder front, back, and side along with the armpit with a clean wet washcloth.  Place a quarter-sized dollop on your shoulder and rub in thoroughly, making sure to cover the front, back, and side of your shoulder, along with the armpit.   2 days before ____ AM   ____ PM              1 day before ____ AM   ____ PM                         Do this twice a day for two days.  (Last application is the night  before surgery, AFTER using the CHG soap as described below).  Do NOT apply benzoyl peroxide gel on the day of surgery.

## 2023-10-08 ENCOUNTER — Other Ambulatory Visit: Payer: Self-pay

## 2023-10-08 ENCOUNTER — Encounter: Payer: Self-pay | Admitting: Internal Medicine

## 2023-10-08 ENCOUNTER — Encounter (HOSPITAL_COMMUNITY): Payer: Self-pay

## 2023-10-08 ENCOUNTER — Encounter (HOSPITAL_COMMUNITY)
Admission: RE | Admit: 2023-10-08 | Discharge: 2023-10-08 | Disposition: A | Discharge status: Home / Self Care | Payer: BC Managed Care – PPO | Source: Ambulatory Visit | Attending: Orthopedic Surgery | Admitting: Orthopedic Surgery

## 2023-10-08 VITALS — BP 125/91 | HR 70 | Temp 99.1°F | Resp 16 | Ht 74.0 in | Wt 213.6 lb

## 2023-10-08 DIAGNOSIS — Z01812 Encounter for preprocedural laboratory examination: Secondary | ICD-10-CM | POA: Diagnosis present

## 2023-10-08 DIAGNOSIS — Z01818 Encounter for other preprocedural examination: Secondary | ICD-10-CM

## 2023-10-08 HISTORY — DX: Unspecified osteoarthritis, unspecified site: M19.90

## 2023-10-08 HISTORY — DX: Cardiac murmur, unspecified: R01.1

## 2023-10-08 HISTORY — DX: Nonrheumatic mitral (valve) prolapse: I34.1

## 2023-10-08 HISTORY — DX: Heart failure, unspecified: I50.9

## 2023-10-08 LAB — CBC
HCT: 39.7 % (ref 39.0–52.0)
Hemoglobin: 13.6 g/dL (ref 13.0–17.0)
MCH: 33.7 pg (ref 26.0–34.0)
MCHC: 34.3 g/dL (ref 30.0–36.0)
MCV: 98.3 fL (ref 80.0–100.0)
Platelets: 156 10*3/uL (ref 150–400)
RBC: 4.04 MIL/uL — ABNORMAL LOW (ref 4.22–5.81)
RDW: 12.9 % (ref 11.5–15.5)
WBC: 6 10*3/uL (ref 4.0–10.5)
nRBC: 0 % (ref 0.0–0.2)

## 2023-10-08 LAB — SURGICAL PCR SCREEN
MRSA, PCR: NEGATIVE
Staphylococcus aureus: NEGATIVE

## 2023-10-08 LAB — BASIC METABOLIC PANEL
Anion gap: 7 (ref 5–15)
BUN: 12 mg/dL (ref 6–20)
CO2: 25 mmol/L (ref 22–32)
Calcium: 9.3 mg/dL (ref 8.9–10.3)
Chloride: 104 mmol/L (ref 98–111)
Creatinine, Ser: 0.74 mg/dL (ref 0.61–1.24)
GFR, Estimated: >60 mL/min (ref >60)
Glucose, Bld: 97 mg/dL (ref 70–99)
Potassium: 4.4 mmol/L (ref 3.5–5.1)
Sodium: 136 mmol/L (ref 135–145)

## 2023-10-08 NOTE — Progress Notes (Signed)
PERIOPERATIVE PRESCRIPTION FOR IMPLANTED CARDIAC DEVICE PROGRAMMING  Patient Information: Name:  Dennis Cortez  DOB:  1966-07-22  MRN:  [id number]    Planned Procedure:   left anatomic total shoulder arthroplasty versus reverse arthroplasty  Surgeon:  Dr Francena Hanly  Date of Procedure:  10/17/2023  Cautery will be used.  Position during surgery:   Please send documentation back to:  [facility] (Fax # [phone])  Device Information:  Clinic EP Physician:  Lewayne Bunting, MD   Device Type:  Defibrillator Manufacturer and Phone #:  [employer]: [phone] Pacemaker Dependent?:  No. Date of Last Device Check:  09/23/23 Normal Device Function?:  Yes.    Electrophysiologist's Recommendations:  Have magnet available. Provide continuous ECG monitoring when magnet is used or reprogramming is to be performed.  Procedure will likely interfere with device function.  Device should be programmed:  Tachy therapies disabled  Per Device Clinic Standing Orders, Lenor Coffin, RN  9:53 AM 10/08/2023

## 2023-10-15 ENCOUNTER — Ambulatory Visit (INDEPENDENT_AMBULATORY_CARE_PROVIDER_SITE_OTHER): Payer: BC Managed Care – PPO

## 2023-10-15 DIAGNOSIS — I447 Left bundle-branch block, unspecified: Secondary | ICD-10-CM

## 2023-10-16 ENCOUNTER — Encounter (HOSPITAL_COMMUNITY): Payer: Self-pay

## 2023-10-16 LAB — CUP PACEART REMOTE DEVICE CHECK
Battery Remaining Longevity: 77 mo
Battery Remaining Percentage: 72 %
Battery Voltage: 2.99 V
Brady Statistic RA Percent Paced: 87 %
Date Time Interrogation Session: 20241105010717
HighPow Impedance: 72 Ohm
Implantable Lead Connection Status: 753985
Implantable Lead Connection Status: 753985
Implantable Lead Connection Status: 753985
Implantable Lead Implant Date: 20220509
Implantable Lead Implant Date: 20220509
Implantable Lead Implant Date: 20220509
Implantable Lead Location: 753858
Implantable Lead Location: 753860
Implantable Lead Location: 753860
Implantable Lead Model: 3830
Implantable Pulse Generator Implant Date: 20220509
Lead Channel Impedance Value: 500 Ohm
Lead Channel Impedance Value: 600 Ohm
Lead Channel Pacing Threshold Amplitude: 0.75 V
Lead Channel Pacing Threshold Pulse Width: 0.4 ms
Lead Channel Sensing Intrinsic Amplitude: 12 mV
Lead Channel Sensing Intrinsic Amplitude: 5 mV
Lead Channel Setting Pacing Amplitude: 2.5 V
Lead Channel Setting Sensing Sensitivity: 0.5 mV
Pulse Gen Serial Number: 810027397
Zone Setting Status: 755011

## 2023-10-16 NOTE — Progress Notes (Signed)
[employer] aware of procedure at 0730 and will be available.

## 2023-10-16 NOTE — Anesthesia Preprocedure Evaluation (Addendum)
Anesthesia Evaluation  Patient identified by MRN, date of birth, ID band Patient awake    Reviewed: Allergy & Precautions, NPO status , Patient's Chart, lab work & pertinent test results, reviewed documented beta blocker date and time   History of Anesthesia Complications Negative for: history of anesthetic complications  Airway Mallampati: I  TM Distance: >3 FB Neck ROM: Full    Dental  (+) Dental Advisory Given, Caps   Pulmonary neg pulmonary ROS   breath sounds clear to auscultation       Cardiovascular hypertension, Pt. on medications and Pt. on home beta blockers (-) angina +CHF  + dysrhythmias Atrial Fibrillation + pacemaker (BiV pacer) + Cardiac Defibrillator  Rhythm:Regular Rate:Normal  Cardiology rec: Procedure will likely interfere with device function.  Device should be programmed:  Tachy therapies disabled  12/2022 ECHO: EF 40 to 45%.  1.LV EF 55 %. The left ventricle has mildly decreased function, global hypokinesis. There is mild LVH. Grade II diastolic dysfunction (pseudonormalization).   2. RVF is mildly reduced. The right ventricular size is moderately enlarged. There is normal pulmonary artery systolic pressure.   3. Left atrial size was severely dilated.   4. Right atrial size was severely dilated.   5. The mitral valve is normal in structure. Mild MR. No evidence of mitral stenosis.   6. The aortic valve is normal in structure. Aortic valve regurgitation is not visualized. No aortic stenosis is present.   7. Aortic dilatation noted. There is mild dilatation of the aortic root,  measuring 40 mm.     Neuro/Psych  Headaches  Anxiety     CVA, No Residual Symptoms    GI/Hepatic Neg liver ROS,GERD  Controlled,,  Endo/Other  negative endocrine ROS    Renal/GU negative Renal ROS     Musculoskeletal  (+) Arthritis ,    Abdominal   Peds  Hematology xarelto   Anesthesia Other Findings    Reproductive/Obstetrics                             Anesthesia Physical Anesthesia Plan  ASA: 4  Anesthesia Plan: General   Post-op Pain Management: Regional block* and Tylenol PO (pre-op)*   Induction: Intravenous  PONV Risk Score and Plan: 2 and Ondansetron and Dexamethasone  Airway Management Planned: Oral ETT  Additional Equipment: None  Intra-op Plan:   Post-operative Plan: Extubation in OR  Informed Consent: I have reviewed the patients History and Physical, chart, labs and discussed the procedure including the risks, benefits and alternatives for the proposed anesthesia with the patient or authorized representative who has indicated his/her understanding and acceptance.     Dental advisory given  Plan Discussed with: CRNA and Surgeon  Anesthesia Plan Comments: (See PAT note from 10/29 by Sherlie Ban PA-C )        Anesthesia Quick Evaluation

## 2023-10-16 NOTE — Progress Notes (Signed)
Called [place] with [employer] for device.  Left voicemail to confirm arrival for surgery tomorrow for first case.

## 2023-10-16 NOTE — Progress Notes (Signed)
Case: [id number] Date/Time: 10/17/23 0715   Procedure: Left anatomic total shoulder arthroplasty versus Reverse arthroplasty (Left: Shoulder) -   Anesthesia type: General   Pre-op diagnosis: Left shoulder osteoarthritis   Location: [facility] ROOM 06 / [facility]   Surgeons: Francena Hanly, MD       DISCUSSION: Dennis Cortez is a 57 year old male who presents to PAT prior to surgery above.  Past medical history significant for A-fib on Xarelto, MVP, MR, LVH, HLD, CHF s/p AICD (2022), history of CVA (2016), GERD, arthritis.  Patient follows with cardiology for history of chronic A-fib on Xarelto and nonischemic cardiomyopathy s/p AICD in 2022.  Last seen on 09/23/2023 and noted to be doing overall well.  Has class II symptoms and has been at baseline.  Cleared for upcoming surgery:  "Preop eval - he is an acceptable surgical risk for shoulder replacement. His Xarelto can be stopped 3 days prior to the surgery. The half life of xarelto is 9-11 hours. Restart xarelto after surgery when the bleeding risk is acceptable."  Device orders in progress note dated 10/29: Electrophysiologist's Recommendations:   Have magnet available. Provide continuous ECG monitoring when magnet is used or reprogramming is to be performed.  Procedure will likely interfere with device function.  Device should be programmed:  Tachy therapies disabled  Patient follows with his PCP for chronic medical issues.  He had a preop exam on 10/18 and was cleared for surgery:  "Preop examination   Assessment: -Risk factors: none -Surgery Risks:intermediate -age, nutritional status, fraility: good nutritional status, age >51, no fraility -functional capacity: > 4 METs wethout symptoms -comorbidities: none Patient Specific Risks: patient is low risk for intermediate risks surgery"   VS: BP (!) 125/91   Pulse 70   Temp 37.3 C (Oral)   Resp 16   Ht 6\' 2"  (1.88 m)   Wt 96.9 kg   SpO2 99%   BMI 27.43 kg/m    PROVIDERS: Deeann Saint, MD Cardiology: Kristeen Miss, MD EP: Lewayne Bunting, MD  LABS: Labs reviewed: Acceptable for surgery. (all labs ordered are listed, but only abnormal results are displayed)  Labs Reviewed  CBC - Abnormal; Notable for the following components:      Result Value   RBC 4.04 (*)    All other components within normal limits  SURGICAL PCR SCREEN  BASIC METABOLIC PANEL     IMAGES:   EKG 09/23/23:  Ventricular paced, rate 70   CV:  Device check 07/16/23:  Scheduled remote reviewed. Normal device function.   Programmed AAI, Left bundle pacing lead in atrial port.  Permanent atrial fib, on OAC, fast NSVT arrhythmias are irregular and  most likely AF, however can not rule out that some of these may be ventricular arrhythmias, this is not a new finding  Next remote 91 days.  Hassell Halim, RN, CCDS, [employer]   Echo 12/18/22:  IMPRESSIONS     1. Left ventricular ejection fraction, by estimation, is 40 to 45%. Left  ventricular ejection fraction by 3D volume is 55 %. The left ventricle has  mildly decreased function. The left ventricle demonstrates global  hypokinesis. There is mild left  ventricular hypertrophy. Left ventricular diastolic parameters are  consistent with Grade II diastolic dysfunction (pseudonormalization).   2. Right ventricular systolic function is mildly reduced. The right  ventricular size is moderately enlarged. There is normal pulmonary artery  systolic pressure.   3. Left atrial size was severely dilated.   4. Right  atrial size was severely dilated.   5. The mitral valve is normal in structure. Mild mitral valve  regurgitation. No evidence of mitral stenosis.   6. The aortic valve is normal in structure. Aortic valve regurgitation is  not visualized. No aortic stenosis is present.   7. Aortic dilatation noted. There is mild dilatation of the aortic root,  measuring 40 mm.   8. The inferior vena cava is normal in  size with greater than 50%  respiratory variability, suggesting right atrial pressure of 3 mmHg.   Comparison(s): Prior images reviewed side by side. The left ventricular  function has improved. Prior EF 30-35%.   Cardiac MRI 11/21/2018:  IMPRESSION: 1. Moderately dilated left ventricle with moderate basal septal hypertrophy and mildly decreased systolic function (LVEF = 49%). There is mild hypokinesis and paradoxical septal motion. There is no late gadolinium enhancement in the left ventricular myocardium.   2. Normal right ventricular size, thickness and mildly decreased systolic function (LVEF = 39%). There are no regional wall motion abnormalities.   3.  Moderate left and right atrial dilatation.   4.  Mild mitral and tricuspid regurgitation.   There is mild bi-ventricular systolic dysfunction, with no evidence for infiltrative or inflammatory cardiomyopathy. This can be arrhythmia induced.  Past Medical History:  Diagnosis Date   AICD (automatic cardioverter/defibrillator) present    Arthritis    Atrial fibrillation (HCC)    Cardioembolic stroke (HCC) 05/19/2015   CHF (congestive heart failure) (HCC)    Chronic anticoagulation 09/15/2015   COVID    Esophageal reflux 09/15/2015   H/O exercise stress test 02/2013   a. ETT-Echo with normal images but significant ST depression and hypotensive response to exercise   Heart murmur    Hx of cardiac catheterization    LHC 03/09/13:  Smooth and normal coronary arteries, normal LVF   Hyperlipemia 10/22/2007   Qualifier: Diagnosis of  By: Alphonzo Severance MD, Loni Dolly    Hyperlipidemia    LVH (left ventricular hypertrophy)    a. Echo 02/2013:  Mild LVH, EF 55-65%, normal wall motion, mild LAE, mild RVE, mild RAE    Mitral valve prolapse    MVP (mitral valve prolapse)     Past Surgical History:  Procedure Laterality Date   BIV ICD INSERTION CRT-D N/A 04/17/2021   Procedure: BIV ICD INSERTION CRT-D;  Surgeon: Marinus Maw, MD;   [facility] INVASIVE CV LAB;  Service: Cardiovascular;  Laterality: N/A;   KNEE SURGERY     left shoulder surgery      US ECHOCARDIOGRAPHY  09/01/2008   EF 55-60%   US ECHOCARDIOGRAPHY  08/17/2005   EF 55-60%    MEDICATIONS:  acetaminophen (TYLENOL) 500 MG tablet   colchicine 0.6 MG tablet   ezetimibe (ZETIA) 10 MG tablet   fish oil-omega-3 fatty acids 1000 MG capsule   losartan (COZAAR) 25 MG tablet   metoprolol succinate (TOPROL-XL) 25 MG 24 hr tablet   metoprolol succinate (TOPROL-XL) 50 MG 24 hr tablet   Multiple Vitamin (MULTIVITAMIN) tablet   rosuvastatin (CRESTOR) 40 MG tablet   venlafaxine XR (EFFEXOR-XR) 75 MG 24 hr capsule   XARELTO 20 MG TABS tablet   No current facility-administered medications for this encounter.   Marcille Blanco [facility] Surgical Short Stay/Anesthesiology [facility] Phone [phone] 10/16/2023 8:55 AM

## 2023-10-17 ENCOUNTER — Other Ambulatory Visit: Payer: Self-pay

## 2023-10-17 ENCOUNTER — Ambulatory Visit (HOSPITAL_COMMUNITY): Payer: Self-pay | Admitting: Medical

## 2023-10-17 ENCOUNTER — Ambulatory Visit (HOSPITAL_COMMUNITY)
Admission: RE | Admit: 2023-10-17 | Discharge: 2023-10-17 | Disposition: A | Discharge status: Home / Self Care | Payer: BC Managed Care – PPO | Attending: Orthopedic Surgery | Admitting: Orthopedic Surgery

## 2023-10-17 ENCOUNTER — Encounter (HOSPITAL_COMMUNITY)
Admission: RE | Disposition: A | Discharge status: Home / Self Care | Payer: Self-pay | Source: Home / Self Care | Attending: Orthopedic Surgery

## 2023-10-17 ENCOUNTER — Other Ambulatory Visit (HOSPITAL_COMMUNITY): Payer: Self-pay

## 2023-10-17 ENCOUNTER — Ambulatory Visit (HOSPITAL_COMMUNITY): Payer: Self-pay | Admitting: Anesthesiology

## 2023-10-17 ENCOUNTER — Ambulatory Visit: Payer: BC Managed Care – PPO | Admitting: Physician Assistant

## 2023-10-17 ENCOUNTER — Encounter (HOSPITAL_COMMUNITY): Payer: Self-pay | Admitting: Orthopedic Surgery

## 2023-10-17 DIAGNOSIS — Z79899 Other long term (current) drug therapy: Secondary | ICD-10-CM | POA: Diagnosis not present

## 2023-10-17 DIAGNOSIS — Z01818 Encounter for other preprocedural examination: Secondary | ICD-10-CM

## 2023-10-17 DIAGNOSIS — M19012 Primary osteoarthritis, left shoulder: Secondary | ICD-10-CM | POA: Diagnosis present

## 2023-10-17 DIAGNOSIS — F419 Anxiety disorder, unspecified: Secondary | ICD-10-CM | POA: Diagnosis not present

## 2023-10-17 DIAGNOSIS — I509 Heart failure, unspecified: Secondary | ICD-10-CM | POA: Insufficient documentation

## 2023-10-17 DIAGNOSIS — M25712 Osteophyte, left shoulder: Secondary | ICD-10-CM | POA: Insufficient documentation

## 2023-10-17 DIAGNOSIS — I4891 Unspecified atrial fibrillation: Secondary | ICD-10-CM | POA: Insufficient documentation

## 2023-10-17 DIAGNOSIS — Z950 Presence of cardiac pacemaker: Secondary | ICD-10-CM | POA: Diagnosis not present

## 2023-10-17 DIAGNOSIS — I110 Hypertensive heart disease with heart failure: Secondary | ICD-10-CM | POA: Diagnosis not present

## 2023-10-17 DIAGNOSIS — Z8673 Personal history of transient ischemic attack (TIA), and cerebral infarction without residual deficits: Secondary | ICD-10-CM | POA: Insufficient documentation

## 2023-10-17 HISTORY — PX: REVERSE SHOULDER ARTHROPLASTY: SHX5054

## 2023-10-17 HISTORY — DX: Presence of automatic (implantable) cardiac defibrillator: Z95.810

## 2023-10-17 SURGERY — ARTHROPLASTY, SHOULDER, TOTAL, REVERSE
Anesthesia: General | Site: Shoulder | Laterality: Left

## 2023-10-17 MED ORDER — DEXAMETHASONE SODIUM PHOSPHATE 10 MG/ML IJ SOLN
INTRAMUSCULAR | Status: AC
  Filled 2023-10-17: qty 1

## 2023-10-17 MED ORDER — ONDANSETRON HCL 4 MG/2ML IJ SOLN
INTRAMUSCULAR | Status: AC
Start: 2023-10-17 — End: ?
  Filled 2023-10-17: qty 2

## 2023-10-17 MED ORDER — ESMOLOL HCL 100 MG/10ML IV SOLN
INTRAVENOUS | Status: DC | PRN
  Administered 2023-10-17: 20 mg via INTRAVENOUS
  Administered 2023-10-17: 40 mg via INTRAVENOUS

## 2023-10-17 MED ORDER — MEPERIDINE HCL 50 MG/ML IJ SOLN: 6.2500 mg | INTRAMUSCULAR | Status: DC | PRN

## 2023-10-17 MED ORDER — SUGAMMADEX SODIUM 200 MG/2ML IV SOLN
INTRAVENOUS | Status: DC | PRN
  Administered 2023-10-17: 200 mg via INTRAVENOUS

## 2023-10-17 MED ORDER — METOPROLOL TARTRATE 5 MG/5ML IV SOLN
INTRAVENOUS | Status: DC | PRN
  Administered 2023-10-17 (×2): 2.5 mg via INTRAVENOUS

## 2023-10-17 MED ORDER — 0.9 % SODIUM CHLORIDE (POUR BTL) OPTIME
TOPICAL | Status: DC | PRN
  Administered 2023-10-17: 1000 mL

## 2023-10-17 MED ORDER — PROPOFOL 10 MG/ML IV BOLUS
INTRAVENOUS | Status: DC | PRN
  Administered 2023-10-17: 100 mg via INTRAVENOUS
  Administered 2023-10-17: 20 mg via INTRAVENOUS

## 2023-10-17 MED ORDER — FENTANYL CITRATE (PF) 100 MCG/2ML IJ SOLN
INTRAMUSCULAR | Status: DC | PRN
  Administered 2023-10-17 (×2): 50 ug via INTRAVENOUS

## 2023-10-17 MED ORDER — PROPOFOL 10 MG/ML IV BOLUS
INTRAVENOUS | Status: AC
  Filled 2023-10-17: qty 20

## 2023-10-17 MED ORDER — STERILE WATER FOR IRRIGATION IR SOLN
Status: DC | PRN
  Administered 2023-10-17: 2000 mL

## 2023-10-17 MED ORDER — ORAL CARE MOUTH RINSE: 15.0000 mL | Freq: Once | OROMUCOSAL | Status: AC

## 2023-10-17 MED ORDER — ROCURONIUM BROMIDE 10 MG/ML (PF) SYRINGE
PREFILLED_SYRINGE | INTRAVENOUS | Status: DC | PRN
  Administered 2023-10-17: 60 mg via INTRAVENOUS
  Administered 2023-10-17 (×2): 10 mg via INTRAVENOUS

## 2023-10-17 MED ORDER — ROCURONIUM BROMIDE 10 MG/ML (PF) SYRINGE
PREFILLED_SYRINGE | INTRAVENOUS | Status: AC
  Filled 2023-10-17: qty 10

## 2023-10-17 MED ORDER — ONDANSETRON HCL 4 MG PO TABS
4.0000 mg | ORAL_TABLET | Freq: Three times a day (TID) | ORAL | 0 refills | Status: DC | PRN
  Filled 2023-10-17: qty 10, 4d supply, fill #0

## 2023-10-17 MED ORDER — BUPIVACAINE-EPINEPHRINE (PF) 0.5% -1:200000 IJ SOLN
INTRAMUSCULAR | Status: DC | PRN
  Administered 2023-10-17: 15 mL via PERINEURAL

## 2023-10-17 MED ORDER — PHENYLEPHRINE HCL-NACL 20-0.9 MG/250ML-% IV SOLN
INTRAVENOUS | Status: DC | PRN
  Administered 2023-10-17: 30 ug/min via INTRAVENOUS

## 2023-10-17 MED ORDER — OXYCODONE HCL 5 MG PO TABS: 5.0000 mg | ORAL_TABLET | Freq: Once | ORAL | Status: DC | PRN

## 2023-10-17 MED ORDER — FENTANYL CITRATE (PF) 100 MCG/2ML IJ SOLN
INTRAMUSCULAR | Status: AC
  Filled 2023-10-17: qty 2

## 2023-10-17 MED ORDER — VANCOMYCIN HCL 1000 MG IV SOLR
INTRAVENOUS | Status: DC | PRN
  Administered 2023-10-17: 1000 mg

## 2023-10-17 MED ORDER — VANCOMYCIN HCL 1000 MG IV SOLR
INTRAVENOUS | Status: AC
  Filled 2023-10-17: qty 20

## 2023-10-17 MED ORDER — OXYCODONE-ACETAMINOPHEN 5-325 MG PO TABS
1.0000 | ORAL_TABLET | ORAL | 0 refills | Status: DC | PRN
  Filled 2023-10-17: qty 20, 4d supply, fill #0

## 2023-10-17 MED ORDER — PHENYLEPHRINE 80 MCG/ML (10ML) SYRINGE FOR IV PUSH (FOR BLOOD PRESSURE SUPPORT)
PREFILLED_SYRINGE | INTRAVENOUS | Status: DC | PRN
  Administered 2023-10-17: 80 ug via INTRAVENOUS
  Administered 2023-10-17 (×2): 20 ug via INTRAVENOUS
  Administered 2023-10-17: 40 ug via INTRAVENOUS

## 2023-10-17 MED ORDER — TRANEXAMIC ACID-NACL 1000-0.7 MG/100ML-% IV SOLN
1000.0000 mg | INTRAVENOUS | Status: AC
  Administered 2023-10-17: 1000 mg via INTRAVENOUS
  Filled 2023-10-17: qty 100

## 2023-10-17 MED ORDER — OXYCODONE HCL 5 MG/5ML PO SOLN
5.0000 mg | Freq: Once | ORAL | Status: DC | PRN
Start: 2023-10-17 — End: 2023-10-17

## 2023-10-17 MED ORDER — CHLORHEXIDINE GLUCONATE 0.12 % MT SOLN
15.0000 mL | Freq: Once | OROMUCOSAL | Status: AC
  Administered 2023-10-17: 15 mL via OROMUCOSAL

## 2023-10-17 MED ORDER — HYDROMORPHONE HCL 1 MG/ML IJ SOLN: 0.2500 mg | INTRAMUSCULAR | Status: DC | PRN

## 2023-10-17 MED ORDER — MIDAZOLAM HCL 2 MG/2ML IJ SOLN
INTRAMUSCULAR | Status: AC
  Filled 2023-10-17: qty 2

## 2023-10-17 MED ORDER — DEXAMETHASONE SODIUM PHOSPHATE 10 MG/ML IJ SOLN
INTRAMUSCULAR | Status: DC | PRN
  Administered 2023-10-17: 10 mg via INTRAVENOUS

## 2023-10-17 MED ORDER — MIDAZOLAM HCL 5 MG/5ML IJ SOLN
INTRAMUSCULAR | Status: DC | PRN
  Administered 2023-10-17 (×2): 1 mg via INTRAVENOUS

## 2023-10-17 MED ORDER — ESMOLOL HCL 100 MG/10ML IV SOLN
INTRAVENOUS | Status: AC
  Filled 2023-10-17: qty 10

## 2023-10-17 MED ORDER — LIDOCAINE 2% (20 MG/ML) 5 ML SYRINGE
INTRAMUSCULAR | Status: DC | PRN
  Administered 2023-10-17: 20 mg via INTRAVENOUS

## 2023-10-17 MED ORDER — LACTATED RINGERS IV SOLN: INTRAVENOUS | Status: DC

## 2023-10-17 MED ORDER — ONDANSETRON HCL 4 MG/2ML IJ SOLN
INTRAMUSCULAR | Status: DC | PRN
  Administered 2023-10-17: 4 mg via INTRAVENOUS

## 2023-10-17 MED ORDER — CYCLOBENZAPRINE HCL 10 MG PO TABS
10.0000 mg | ORAL_TABLET | Freq: Three times a day (TID) | ORAL | 1 refills | Status: DC | PRN
  Filled 2023-10-17: qty 30, 10d supply, fill #0

## 2023-10-17 MED ORDER — LIDOCAINE HCL (PF) 2 % IJ SOLN
INTRAMUSCULAR | Status: AC
  Filled 2023-10-17: qty 5

## 2023-10-17 MED ORDER — ACETAMINOPHEN 500 MG PO TABS
1000.0000 mg | ORAL_TABLET | Freq: Once | ORAL | Status: AC
  Administered 2023-10-17: 1000 mg via ORAL
  Filled 2023-10-17: qty 2

## 2023-10-17 MED ORDER — CEFAZOLIN SODIUM-DEXTROSE 2-4 GM/100ML-% IV SOLN
2.0000 g | INTRAVENOUS | Status: AC
  Administered 2023-10-17: 2 g via INTRAVENOUS
  Filled 2023-10-17: qty 100

## 2023-10-17 MED ORDER — BUPIVACAINE LIPOSOME 1.3 % IJ SUSP
INTRAMUSCULAR | Status: DC | PRN
  Administered 2023-10-17: 10 mL via PERINEURAL

## 2023-10-17 MED ORDER — MIDAZOLAM HCL 2 MG/2ML IJ SOLN
0.5000 mg | Freq: Once | INTRAMUSCULAR | Status: DC | PRN
Start: 2023-10-17 — End: 2023-10-17

## 2023-10-17 MED ORDER — METOPROLOL TARTRATE 5 MG/5ML IV SOLN
INTRAVENOUS | Status: AC
  Filled 2023-10-17: qty 5

## 2023-10-17 SURGICAL SUPPLY — 71 items
ADH SKN CLS APL DERMABOND .7 (GAUZE/BANDAGES/DRESSINGS) ×1
AID PSTN UNV HD RSTRNT DISP (MISCELLANEOUS) ×1
BAG COUNTER SPONGE SURGICOUNT (BAG) IMPLANT
BAG SPEC THK2 15X12 ZIP CLS (MISCELLANEOUS) ×1
BAG SPNG CNTER NS LX DISP (BAG)
BAG ZIPLOCK 12X15 (MISCELLANEOUS) ×1 IMPLANT
BASEPLATE 24 10D FULL AUGM (Plate) IMPLANT
BIT DRILL AR 3 NS (BIT) IMPLANT
BLADE SAW SGTL 83.5X18.5 (BLADE) ×1 IMPLANT
BNDG CMPR 5X4 CHSV STRCH STRL (GAUZE/BANDAGES/DRESSINGS) ×1
BNDG COHESIVE 4X5 TAN STRL LF (GAUZE/BANDAGES/DRESSINGS) ×1 IMPLANT
BSPLAT GLND 10D OBLQ 24 FULL (Plate) ×1 IMPLANT
COOLER ICEMAN CLASSIC (MISCELLANEOUS) ×1 IMPLANT
COVER BACK TABLE 60X90IN (DRAPES) ×1 IMPLANT
COVER SURGICAL LIGHT HANDLE (MISCELLANEOUS) ×1 IMPLANT
CUP SUT UNIV REVERS 36 NEUTRAL (Cup) IMPLANT
DERMABOND ADVANCED .7 DNX12 (GAUZE/BANDAGES/DRESSINGS) ×1 IMPLANT
DRAPE SHEET LG 3/4 BI-LAMINATE (DRAPES) ×1 IMPLANT
DRAPE SURG 17X11 SM STRL (DRAPES) ×1 IMPLANT
DRAPE SURG ORHT 6 SPLT 77X108 (DRAPES) ×2 IMPLANT
DRAPE TOP 10253 STERILE (DRAPES) ×1 IMPLANT
DRAPE U-SHAPE 47X51 STRL (DRAPES) ×1 IMPLANT
DRESSING AQUACEL AG SP 3.5X6 (GAUZE/BANDAGES/DRESSINGS) ×1 IMPLANT
DRSG AQUACEL AG ADV 3.5X 6 (GAUZE/BANDAGES/DRESSINGS) IMPLANT
DRSG AQUACEL AG ADV 3.5X10 (GAUZE/BANDAGES/DRESSINGS) IMPLANT
DRSG AQUACEL AG SP 3.5X6 (GAUZE/BANDAGES/DRESSINGS) ×1
DURAPREP 26ML APPLICATOR (WOUND CARE) ×1 IMPLANT
ELECT BLADE TIP CTD 4 INCH (ELECTRODE) ×1 IMPLANT
ELECT PENCIL ROCKER SW 15FT (MISCELLANEOUS) ×1 IMPLANT
ELECT REM PT RETURN 15FT ADLT (MISCELLANEOUS) ×1 IMPLANT
FACESHIELD WRAPAROUND (MASK) ×5
FACESHIELD WRAPAROUND OR TEAM (MASK) ×5 IMPLANT
GLENOSPHERE 39+4 LAT/24 UNI RV (Joint) IMPLANT
GLOVE BIO SURGEON STRL SZ7.5 (GLOVE) ×1 IMPLANT
GLOVE BIO SURGEON STRL SZ8 (GLOVE) ×1 IMPLANT
GLOVE SS BIOGEL STRL SZ 7 (GLOVE) ×1 IMPLANT
GLOVE SS BIOGEL STRL SZ 7.5 (GLOVE) ×1 IMPLANT
GOWN STRL SURGICAL XL XLNG (GOWN DISPOSABLE) ×2 IMPLANT
INSERT HUMERAL 39/+6 (Insert) IMPLANT
KIT BASIN OR (CUSTOM PROCEDURE TRAY) ×1 IMPLANT
KIT TURNOVER KIT A (KITS) IMPLANT
MANIFOLD NEPTUNE II (INSTRUMENTS) ×1 IMPLANT
MOD POST FOR MGS BSPLATE 30 (Miscellaneous) ×1 IMPLANT
NDL TAPERED W/ NITINOL LOOP (MISCELLANEOUS) ×1 IMPLANT
NEEDLE TAPERED W/ NITINOL LOOP (MISCELLANEOUS) ×1
NS IRRIG 1000ML POUR BTL (IV SOLUTION) ×1 IMPLANT
PACK SHOULDER (CUSTOM PROCEDURE TRAY) ×1 IMPLANT
PAD ARMBOARD 7.5X6 YLW CONV (MISCELLANEOUS) ×1 IMPLANT
PAD COLD SHLDR WRAP-ON (PAD) ×1 IMPLANT
PIN NITINOL TARGETER 2.8 (PIN) IMPLANT
PIN SET MODULAR GLENOID SYSTEM (PIN) IMPLANT
POST MODLR FOR MGS BSPLATE 30 (Miscellaneous) IMPLANT
REAMER ANGLED HEAD SMALL (DRILL) IMPLANT
RESTRAINT HEAD UNIVERSAL NS (MISCELLANEOUS) ×1 IMPLANT
SCREW PERI LOCK 5.5X24 (Screw) IMPLANT
SCREW PERI LOCK 5.5X36 (Screw) IMPLANT
SCREW PERIPHERAL 5.5X20 LOCK (Screw) IMPLANT
SCREW PERIPHERAL NL 4.5X36 (Screw) IMPLANT
SLING ARM FOAM STRAP LRG (SOFTGOODS) IMPLANT
SLING ARM FOAM STRAP MED (SOFTGOODS) IMPLANT
STEM HUM UNIV REV SZ11 (Stem) IMPLANT
SUT MNCRL AB 3-0 PS2 18 (SUTURE) ×1 IMPLANT
SUT MON AB 2-0 CT1 36 (SUTURE) ×1 IMPLANT
SUT VIC AB 1 CT1 36 (SUTURE) ×1 IMPLANT
SUTURE TAPE 1.3 40 TPR END (SUTURE) ×2 IMPLANT
SUTURETAPE 1.3 40 TPR END (SUTURE) ×2
TOWEL OR 17X26 10 PK STRL BLUE (TOWEL DISPOSABLE) ×1 IMPLANT
TOWEL OR NON WOVEN STRL DISP B (DISPOSABLE) ×1 IMPLANT
TUBE SUCTION HIGH CAP CLEAR NV (SUCTIONS) ×1 IMPLANT
TUBING CONNECTING 10 (TUBING) ×1 IMPLANT
WATER STERILE IRR 1000ML POUR (IV SOLUTION) ×2 IMPLANT

## 2023-10-17 NOTE — Anesthesia Procedure Notes (Addendum)
Anesthesia Regional Block: Interscalene brachial plexus block   Pre-Anesthetic Checklist: , timeout performed,  Correct Patient, Correct Site, Correct Laterality,  Correct Procedure, Correct Position, site marked,  Risks and benefits discussed,  Surgical consent,  Pre-op evaluation,  At surgeon's request and post-op pain management  Laterality: Left and Upper  Prep: chloraprep       Needles:  Injection technique: Single-shot  Needle Type: Echogenic Needle     Needle Length: 9cm  Needle Gauge: 21     Additional Needles:   Procedures:,,,, ultrasound used (permanent image in chart),,    Narrative:  Start time: 10/17/2023 6:59 AM End time: 10/17/2023 7:05 AM Injection made incrementally with aspirations every 5 mL.  Performed by: Personally  Anesthesiologist: Jairo Ben, MD  Additional Notes: Pt identified in Holding room.  Monitors applied. Working IV access confirmed. Timeout, Sterile prep L clavicle and neck.  #21ga ECHOgenic Arrow block needle to interscalene brachial plexus with US guidance.  15cc 0.5% Bupivacaine 1:200k epi, exparel injected incrementally after negative test dose.  Patient asymptomatic, VSS, no heme aspirated, tolerated well.   Sandford Craze, MD

## 2023-10-17 NOTE — Transfer of Care (Signed)
Immediate Anesthesia Transfer of Care Note  Patient: Dennis Cortez  Procedure(s) Performed: Left anatomic total shoulder arthroplasty versus Reverse arthroplasty (Left: Shoulder)  Patient Location: PACU  Anesthesia Type:General  Level of Consciousness: awake, alert , and oriented  Airway & Oxygen Therapy: Patient Spontanous Breathing and Patient connected to face mask oxygen  Post-op Assessment: Report given to RN and Post -op Vital signs reviewed and stable  Post vital signs: Reviewed and stable  Last Vitals:  Vitals Value Taken Time  BP 129/61 10/17/23 0948  Temp    Pulse 85 10/17/23 0950  Resp 18 10/17/23 0950  SpO2 100 % 10/17/23 0950  Vitals shown include unfiled device data.  Last Pain:  Vitals:   10/17/23 0549  TempSrc:   PainSc: 0-No pain         Complications: No notable events documented.

## 2023-10-17 NOTE — Evaluation (Signed)
Occupational Therapy Evaluation Patient Details Name: Dennis Cortez MRN: [id number] DOB: 01-Feb-1966 Today's Date: 10/17/2023   History of Present Illness Dennis Cortez is a 57 yr old male who is s/p a L shoulder reverse arthroplasty 10-17-23, due to shoulder OA with severe glenoid retroversion & posterior subluxation of the humeral head.   Clinical Impression   Pt is s/p shoulder replacement of dominant LUE on 10-17-23. Therapist provided education and instruction to patient and spouse with regards to ROM/exercises, post-op precautions, UE and sling positioning, donning upper extremity clothing, recommendations for bathing while maintaining shoulder precautions, use of ice for pain and edema management, use of ice machine, and correctly donning/doffing sling. Patient and spouse verbalized and demonstrated understanding as needed. Patient needed assistance to donn shirt, underwear, shorts, socks and shoes with instruction provided on compensatory strategies to perform ADLs. Patient to follow up with MD for further therapy needs.         If plan is discharge home, recommend the following: Assist for transportation;Assistance with cooking/housework;A little help with bathing/dressing/bathroom    Functional Status Assessment  Patient has had a recent decline in their functional status and demonstrates the ability to make significant improvements in function in a reasonable and predictable amount of time.  Equipment Recommendations  Tub/shower seat    Recommendations for Other Services       Precautions / Restrictions Precautions Precautions: Shoulder Shoulder Interventions: Shoulder sling/immobilizer Precaution Booklet Issued: Yes (comment) Precaution Comments: Okay to perform elbow, wrist, and hand ROM Required Braces or Orthoses: Sling Restrictions Weight Bearing Restrictions: Yes LUE Weight Bearing: Non weight bearing Other Position/Activity Restrictions: If sitting in controlled  environment, ok to come out of sling to give neck a break. Please sleep in it to protect until follow up in office.     OK to use operative arm for feeding, hygiene and ADLs.   Ok to instruct Pendulums and lap slides as exercises. Ok to use operative arm within the following parameters for ADL purposes     New ROM (4/01)   Ok for PROM, AAROM, AROM within pain tolerance and within the following ROM   ER 20   ABD 45   FE 60      Mobility Bed Mobility      General bed mobility comments: pt was received seated in chair    Transfers Overall transfer level: Needs assistance   Transfers: Sit to/from Stand Sit to Stand: Supervision                  Balance Overall balance assessment: No apparent balance deficits (not formally assessed)           ADL either performed or assessed with clinical judgement       Pertinent Vitals/Pain Pain Assessment Pain Assessment: No/denies pain     Extremity/Trunk Assessment Upper Extremity Assessment Upper Extremity Assessment: Left hand dominant   Lower Extremity Assessment Lower Extremity Assessment: Overall WFL for tasks assessed       Communication     Cognition Arousal: Alert Behavior During Therapy: WFL for tasks assessed/performed Overall Cognitive Status: Within Functional Limits for tasks assessed        General Comments: Oriented x4, able to follow commands without difficulty           Shoulder Instructions Shoulder Instructions Donning/doffing shirt without moving shoulder: Minimal assistance (with pt's spouse/caregiver primarily performing and receiving instruction as needed) Method for sponge bathing under operated UE: Caregiver independent with task;Patient able to independently direct caregiver  Donning/doffing sling/immobilizer: Minimal assistance (with pt's spouse/caregiver primarily performing and receiving instruction as needed) Correct positioning of sling/immobilizer: Minimal assistance (with pt's  spouse/caregiver primarily performing and receiving instruction as needed) Pendulum exercises (written home exercise program): Caregiver independent with task;Patient able to independently direct caregiver ROM for elbow, wrist and digits of operated UE: Caregiver independent with task;Patient able to independently direct caregiver Sling wearing schedule (on at all times/off for ADL's): Caregiver independent with task;Patient able to independently direct caregiver Proper positioning of operated UE when showering: Caregiver independent with task;Patient able to independently direct caregiver Dressing change:  (N/A) Positioning of UE while sleeping: Caregiver independent with task;Patient able to independently direct caregiver    Home Living Family/patient expects to be discharged to:: Private residence Living Arrangements: Spouse/significant other Available Help at Discharge: Family Type of Home: House       Home Layout: Two level;Able to live on main level with bedroom/bathroom Alternate Level Stairs-Number of Steps: walk-in shower is upstairs   Bathroom Shower/Tub: Tub/shower unit;Walk-in shower         Home Equipment: None          Prior Functioning/Environment Prior Level of Function : Independent/Modified Independent;Driving             Mobility Comments: He was independent with ambulation. ADLs Comments: He was independent with ADLs and working full-time.        OT Problem List: Impaired UE functional use      OT Treatment/Interventions:   N/A      OT Frequency:  N/A       AM-PAC OT "6 Clicks" Daily Activity     Outcome Measure Help from another person eating meals?: None Help from another person taking care of personal grooming?: None Help from another person toileting, which includes using toliet, bedpan, or urinal?: A Little Help from another person bathing (including washing, rinsing, drying)?: A Little Help from another person to put on and taking  off regular upper body clothing?: A Little Help from another person to put on and taking off regular lower body clothing?: A Little 6 Click Score: 20   End of Session Equipment Utilized During Treatment: Other (comment) (N/A) Nurse Communication: Other (comment) (shoulder education completed)  Activity Tolerance: Patient tolerated treatment well Patient left: in chair;with call bell/phone within reach;with family/visitor present  OT Visit Diagnosis: Muscle weakness (generalized) (M62.81)                Time: 2841-3244 OT Time Calculation (min): 33 min Charges:  OT General Charges $OT Visit: 1 Visit OT Evaluation $OT Eval Moderate Complexity: 1 Mod OT Treatments $Self Care/Home Management : 8-22 mins   [name], OTR/L 10/17/2023, 3:30 PM

## 2023-10-17 NOTE — Progress Notes (Signed)
Patient reported rash on left shoulder and armpit after using the benzoyl Peroxide gel on shoulder for surgery.    6:05 AM Dr Rennis Chris called, stated we will proceed with planned case

## 2023-10-17 NOTE — Anesthesia Postprocedure Evaluation (Signed)
Anesthesia Post Note  Patient: Dennis Cortez  Procedure(s) Performed: Left anatomic total shoulder arthroplasty versus Reverse arthroplasty (Left: Shoulder)     Patient location during evaluation: PACU Anesthesia Type: General and Regional Level of consciousness: awake and alert, oriented and patient cooperative Pain management: pain level controlled Vital Signs Assessment: post-procedure vital signs reviewed and stable Respiratory status: spontaneous breathing, nonlabored ventilation and respiratory function stable Cardiovascular status: blood pressure returned to baseline and stable Postop Assessment: no apparent nausea or vomiting Anesthetic complications: no Comments: Device re-programmed and checked by Device Tech   No notable events documented.  Last Vitals:  Vitals:   10/17/23 1059 10/17/23 1115  BP: (!) 115/99 122/67  Pulse: 70 70  Resp:    Temp: 36.7 C   SpO2: 100% 98%    Last Pain:  Vitals:   10/17/23 1059  TempSrc: Oral  PainSc: 0-No pain                 Norrin Shreffler,E. Gryffin [place]

## 2023-10-17 NOTE — Op Note (Signed)
10/17/2023  9:27 AM  PATIENT:   Dennis Cortez  57 y.o. 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  ASSISTANTS: Ralene Bathe, PA-C  Ralene Bathe, PA-C was utilized as an Geophysicist/field seismologist throughout this case, essential for help with positioning the patient, positioning extremity, tissue manipulation, implantation of the prosthesis, suture management, wound closure, and intraoperative decision-making.  ANESTHESIA:   General Endotracheal and interscalene block with Exparel  EBL: 250 cc  SPECIMEN: None  Drains: None   PATIENT DISPOSITION:  PACU - hemodynamically stable.    PLAN OF CARE: Discharge to home after PACU  Brief history:  Patient is a 57 year old gentleman with chronic and progressive increasing left shoulder pain related to severe osteoarthritis.  His symptoms have now significantly deteriorated with loss of functional capabilities for many ADLs.  Additionally, plain films show significant glenoid retroversion with a B2 glenoid and significant subluxation of the humeral head posteriorly.  He is brought to the operating this time for planned left shoulder reverse arthroplasty.  Preoperatively, I counseled the patient regarding treatment options and risks versus benefits thereof.  Possible surgical complications were all reviewed including potential for bleeding, infection, neurovascular injury, persistent pain, loss of motion, anesthetic complication, failure of the implant, and possible need for additional surgery. They understand and accept and agrees with our planned procedure.   Procedure detail:  After undergoing  routine preop evaluation the patient received prophylactic antibiotics and interscalene block with Exparel was established in the holding area by the anesthesia department.  Subsequently placed spine on the operating table and underwent the smooth induction of a general endotracheal anesthesia.  Placed into the beachchair position and appropriately padded and protected.  Additionally he was fitted with an external pacemaker with proper adjustment of his implantable pacemaker/defibrillator having been performed in the preop holding area.  The left shoulder girdle region was then sterilely prepped and draped in standard fashion.  Timeout was called.  A deltopectoral approach was made to the left shoulder through an approximate 10 cm incision.  Skin flaps were elevated dissection carried deeply and the deltopectoral interval was then developed from proximal to distal with the vein taken laterally.  Conjoined tendon was mobilized and retracted medially.  The long head biceps tendon was then tenodesed at the upper border the pectoralis major tendon with proximal segment unroofed and excised.  The subscapularis was then separated from the lesser tuberosity using electrocautery with the superior rotator cuff split from the apex of the bicipital groove to the base of the coracoid and the free margin of the subscap was intact with a pair of grasping suture tape sutures and then capsular attachments were then carefully divided from the anterior and inferior margins of the humeral neck allowing deliver the humeral head through the wound.  An extra medullary guide was then used to outline the proposed humeral head resection which we performed at approximate 20 degrees of retroversion.  An oscillating saw was then used to perform our humeral head resection and marginal osteophytes were then removed with a rondure.  A metal cap was then placed at the cut proximal humeral surface and we then exposed the glenoid and performed a  circumferential labral resection.  Exposure  was quite challenging due to the significant retroversion.  Ultimately appropriate exposure was gained and then using the preoperative templating achieved for CT scan and software program identified the appropriate starting point to utilize a 10 degree augment wedge and a guidepin was then directed into the glenoid and the glenoid was prepared with a 10 degree angle reamer.  This was taken down to a stable subchondral bony bed.  Preparation completed with a 30 mm reamer for our central post.  The final implant was then assembled and using the appropriate guidance impacted the baseplate at the proper rotation with excellent fixation.  An inferior nonlocking screw was then placed to secure fixation and the balance of the baseplate screws were placed using a locking technique with excellent fixation.  A 39/+4 glenosphere was then impacted onto the baseplate and the central locking screw was placed.  We then returned her attention back to the humeral shaft with the canal was opened and we ultimately broached up to a size 11 stem at 20 degrees of retroversion.  A neutral metaphyseal reaming guide was then used to prepare the metaphysis.  Trial implant was placed trial reduction showed excellent motion stability and soft tissue balance.  Our trial was then removed.  The final implant was assembled.  The canal was irrigated cleaned and dried and vancomycin powder sprayed into the humeral canal and a final implant was seated with excellent fixation.  We then performed a series of trial reductions ultimately felt that the +6 poly gives the best motion stability and soft tissue balance.  The trial poly was removed for the final poly was then impacted onto the implant after was meticulously cleaned and dried.  Our final reduction again showed excellent motion stability and soft tissue balance all much to our satisfaction.  The wound was then copious irrigated.  The subscapularis was  mobilized and confirmed good elasticity and the subscap was then repaired back to the eyelets on the collar of the implant using the previously placed suture tape sutures.  The arm easily achieved 45 degrees of external rotation without excessive tension on the subscap repair.  Final irrigation was completed.  The balance of the vancomycin powder was then sprayed liberally throughout the deep soft tissue planes.  Deltopectoral interval reapproximated with a series of figure-of-eight number Vicryl sutures.  2-0 Monocryl used to close the subcu layer and intracuticular 3-0 Monocryl used to close the skin followed by Dermabond and Aquacel dressing.  The left arm was placed into a sling and the patient was awakened, extubated, and taken to the recovery in stable condition.  Senaida Lange MD   Contact # [phone]

## 2023-10-17 NOTE — H&P (Signed)
Dennis Cortez    Chief Complaint: Left shoulder osteoarthritis HPI: The patient is a 57 y.o. male with chronic and progressively increasing left shoulder pain related to severe osteoarthritis.  Additionally, patient has significant glenoid retroversion with posterior subluxation of the humeral head.  Brought to the operating room today for planned left shoulder reverse arthroplasty  Past Medical History:  Diagnosis Date   AICD (automatic cardioverter/defibrillator) present    Arthritis    Atrial fibrillation (HCC)    Cardioembolic stroke (HCC) 05/19/2015   CHF (congestive heart failure) (HCC)    Chronic anticoagulation 09/15/2015   COVID    Esophageal reflux 09/15/2015   H/O exercise stress test 02/2013   a. ETT-Echo with normal images but significant ST depression and hypotensive response to exercise   Heart murmur    Hx of cardiac catheterization    LHC 03/09/13:  Smooth and normal coronary arteries, normal LVF   Hyperlipidemia    LVH (left ventricular hypertrophy)    a. Echo 02/2013:  Mild LVH, EF 55-65%, normal wall motion, mild LAE, mild RVE, mild RAE    MVP (mitral valve prolapse)       Past Surgical History:  Procedure Laterality Date   BIV ICD INSERTION CRT-D N/A 04/17/2021   Procedure: BIV ICD INSERTION CRT-D;  Surgeon: Marinus Maw, MD;  Location: [facility] INVASIVE CV LAB;  Service: Cardiovascular;  Laterality: N/A;   KNEE SURGERY     left shoulder surgery      US ECHOCARDIOGRAPHY  09/01/2008   EF 55-60%   US ECHOCARDIOGRAPHY  08/17/2005   EF 55-60%    Family History  Adopted: Yes    Social History:  reports that he has never smoked. He has never used smokeless tobacco. He reports current alcohol use. He reports that he does not use drugs.  BMI: Estimated body mass index is 27.43 kg/m as calculated from the following:   Height as of 10/08/23: 6\' 2"  (1.88 m).   Weight as of this encounter: 96.9 kg.  Lab Results  Component Value Date   ALBUMIN 4.8 07/31/2023    Diabetes: Patient does not have a diagnosis of diabetes.     Smoking Status:   reports that he has never smoked. He has never used smokeless tobacco.     Medications Prior to Admission  Medication Sig Dispense Refill   acetaminophen (TYLENOL) 500 MG tablet Take 500 mg by mouth every 6 (six) hours as needed for moderate pain (pain score 4-6).     ezetimibe (ZETIA) 10 MG tablet TAKE 1 TABLET (10 MG TOTAL) BY MOUTH DAILY. PATIENT NEEDS TO KEEP APPOINTMENT IN APRIL FOR ANY FUTURE REFILLS. (Patient taking differently: Take 10 mg by mouth every evening. Patient needs to keep appointment in April for any future refills.) 30 tablet 0   fish oil-omega-3 fatty acids 1000 MG capsule Take 1 g by mouth in the morning.     losartan (COZAAR) 25 MG tablet TAKE 1 TABLET (25 MG TOTAL) BY MOUTH DAILY. 90 tablet 3   metoprolol succinate (TOPROL-XL) 25 MG 24 hr tablet TAKE 1 TABLET (25 MG TOTAL) BY MOUTH DAILY. (Patient taking differently: Take 25 mg by mouth in the morning.) 90 tablet 3   metoprolol succinate (TOPROL-XL) 50 MG 24 hr tablet Take 50 mg by mouth at bedtime.     Multiple Vitamin (MULTIVITAMIN) tablet Take 1 tablet by mouth in the morning.     rosuvastatin (CRESTOR) 40 MG tablet Take 1 tablet (40 mg total) by mouth  daily. (Patient taking differently: Take 40 mg by mouth every evening.) 90 tablet 3   venlafaxine XR (EFFEXOR-XR) 75 MG 24 hr capsule Take 1 capsule (75 mg total) by mouth daily. 90 capsule 0   XARELTO 20 MG TABS tablet TAKE 1 TABLET BY MOUTH EVERY DAY WITH SUPPER 90 tablet 1   colchicine 0.6 MG tablet TAKE 2 TABS AT FIRST FIGN OF GOUT, MAY REPEAT 1 TAB IN AN HOUR. TAKE DAILY AFTER FOR UP TO 10 DAYS (Patient taking differently: Take 0.6 mg by mouth daily as needed (gout flare).) 30 tablet 1     Physical Exam: Left shoulder demonstrates painful and guarded motion as noted at recent office visits.  Does maintain good strength to manual muscle testing.  He is neurovascular intact the  left upper extremity.  This morning he does present with an erythematous rash about the shoulder likely related to his use of benzyl peroxide preoperatively.  This has been evaluated and does not show any evidence to suggest skin compromise, weeping, or drainage.  Plain radiographs as well as preop imaging are reviewed.  The studies do show severe osteoarthritis with complete obliteration of joint space.  Significant glenoid retroversion with a B2 glenoid and moderate to severe posterior humeral head subluxation are noted.  Vitals  Temp:  [98 F (36.7 C)] 98 F (36.7 C) (11/07 0533) Pulse Rate:  [75] 75 (11/07 0533) Resp:  [18] 18 (11/07 0533) BP: (131)/(66) 131/66 (11/07 0533) SpO2:  [98 %] 98 % (11/07 0533) Weight:  [96.9 kg] 96.9 kg (11/07 0549)  Assessment/Plan  Impression: Left shoulder osteoarthritis  Plan of Action: Procedure(s): Left shoulder reverse arthroplasty  Dennis Cortez 10/17/2023, 6:16 AM Contact # [phone]

## 2023-10-17 NOTE — Discharge Instructions (Signed)

## 2023-10-17 NOTE — Anesthesia Procedure Notes (Signed)
Procedure Name: Intubation Date/Time: 10/17/2023 7:47 AM  Performed by: Orest Dikes, CRNAPre-anesthesia Checklist: Patient identified, Emergency Drugs available, Suction available and Patient being monitored Patient Re-evaluated:Patient Re-evaluated prior to induction Oxygen Delivery Method: Circle system utilized Preoxygenation: Pre-oxygenation with 100% oxygen Induction Type: IV induction Ventilation: Mask ventilation without difficulty Laryngoscope Size: Mac and 4 Grade View: Grade I Tube type: Oral Tube size: 7.5 mm Number of attempts: 1 Airway Equipment and Method: Stylet Placement Confirmation: ETT inserted through vocal cords under direct vision, positive ETCO2 and breath sounds checked- equal and bilateral Secured at: 21 cm Tube secured with: Tape Dental Injury: Teeth and Oropharynx as per pre-operative assessment

## 2023-10-18 ENCOUNTER — Encounter (HOSPITAL_COMMUNITY): Payer: Self-pay | Admitting: Orthopedic Surgery

## 2023-10-21 ENCOUNTER — Encounter (HOSPITAL_COMMUNITY): Payer: Self-pay | Admitting: Orthopedic Surgery

## 2023-11-12 NOTE — Progress Notes (Signed)
Remote ICD transmission.   

## 2023-11-16 ENCOUNTER — Other Ambulatory Visit: Payer: Self-pay | Admitting: Family Medicine

## 2023-11-16 DIAGNOSIS — Z8669 Personal history of other diseases of the nervous system and sense organs: Secondary | ICD-10-CM

## 2023-11-16 DIAGNOSIS — F419 Anxiety disorder, unspecified: Secondary | ICD-10-CM

## 2023-11-20 NOTE — Telephone Encounter (Signed)
Pt called to F/U on this refill request, stating he only has a few pills left.  Please send refill, at your earliest convenience.

## 2023-12-03 ENCOUNTER — Other Ambulatory Visit: Payer: Self-pay | Admitting: Student

## 2023-12-03 DIAGNOSIS — I5022 Chronic systolic (congestive) heart failure: Secondary | ICD-10-CM

## 2023-12-03 DIAGNOSIS — I482 Chronic atrial fibrillation, unspecified: Secondary | ICD-10-CM

## 2023-12-27 ENCOUNTER — Other Ambulatory Visit: Payer: Self-pay

## 2023-12-27 DIAGNOSIS — I482 Chronic atrial fibrillation, unspecified: Secondary | ICD-10-CM

## 2023-12-27 MED ORDER — RIVAROXABAN 20 MG PO TABS: 20.0000 mg | ORAL_TABLET | Freq: Every day | ORAL | 1 refills | Status: DC

## 2023-12-27 NOTE — Telephone Encounter (Signed)
Prescription refill request for Xarelto received.  Indication:AFIB Last office visit:10/24 Weight:96.9  KG Age:58 Scr:0.74  9/24 CrCl:149.13  ML/MIN  PRESCRIPTION REFILLED

## 2024-01-14 ENCOUNTER — Ambulatory Visit (INDEPENDENT_AMBULATORY_CARE_PROVIDER_SITE_OTHER): Payer: BC Managed Care – PPO

## 2024-01-14 DIAGNOSIS — I447 Left bundle-branch block, unspecified: Secondary | ICD-10-CM

## 2024-01-14 DIAGNOSIS — I255 Ischemic cardiomyopathy: Secondary | ICD-10-CM

## 2024-01-14 LAB — CUP PACEART REMOTE DEVICE CHECK
Battery Remaining Longevity: 74 mo
Battery Remaining Percentage: 70 %
Battery Voltage: 2.98 V
Brady Statistic RA Percent Paced: 89 %
Date Time Interrogation Session: 20250204020205
HighPow Impedance: 65 Ohm
Implantable Lead Connection Status: 753985
Implantable Lead Connection Status: 753985
Implantable Lead Connection Status: 753985
Implantable Lead Implant Date: 20220509
Implantable Lead Implant Date: 20220509
Implantable Lead Implant Date: 20220509
Implantable Lead Location: 753858
Implantable Lead Location: 753860
Implantable Lead Location: 753860
Implantable Lead Model: 3830
Implantable Pulse Generator Implant Date: 20220509
Lead Channel Impedance Value: 500 Ohm
Lead Channel Impedance Value: 580 Ohm
Lead Channel Pacing Threshold Amplitude: 0.75 V
Lead Channel Pacing Threshold Pulse Width: 0.4 ms
Lead Channel Sensing Intrinsic Amplitude: 12 mV
Lead Channel Sensing Intrinsic Amplitude: 5 mV
Lead Channel Setting Pacing Amplitude: 2.5 V
Lead Channel Setting Sensing Sensitivity: 0.5 mV
Pulse Gen Serial Number: 810027397
Zone Setting Status: 755011

## 2024-02-16 ENCOUNTER — Other Ambulatory Visit: Payer: Self-pay | Admitting: Family Medicine

## 2024-02-16 DIAGNOSIS — Z8669 Personal history of other diseases of the nervous system and sense organs: Secondary | ICD-10-CM

## 2024-02-16 DIAGNOSIS — F419 Anxiety disorder, unspecified: Secondary | ICD-10-CM

## 2024-02-19 ENCOUNTER — Telehealth (INDEPENDENT_AMBULATORY_CARE_PROVIDER_SITE_OTHER): Admitting: Family Medicine

## 2024-02-19 DIAGNOSIS — Z8669 Personal history of other diseases of the nervous system and sense organs: Secondary | ICD-10-CM

## 2024-02-19 DIAGNOSIS — F419 Anxiety disorder, unspecified: Secondary | ICD-10-CM

## 2024-02-19 MED ORDER — VENLAFAXINE HCL ER 75 MG PO CP24
75.0000 mg | ORAL_CAPSULE | Freq: Every day | ORAL | 3 refills | Status: AC
Start: 2024-02-19 — End: ?

## 2024-02-19 NOTE — Progress Notes (Signed)
 Virtual Visit via Video Note  I connected with Dennis Cortez on 02/19/24 at 11:00 AM EDT by a video enabled telemedicine application and verified that I am speaking with the correct person using two identifiers.  Location patient: home Location provider:work or home office Persons participating in the virtual visit: patient, provider  I discussed the limitations of evaluation and management by telemedicine and the availability of in person appointments. The patient expressed understanding and agreed to proceed.  Chief Complaint  Patient presents with   Medication Refill    HPI:  Pt is a 58 yo male seen for f/u.  Pt requesting refill on Effexor XR 75 mg.  On med for yrs for anxiety and migraines.  Initially rx'd by Neurology after pt had an MI.  Pt notes sleep, appetite, and mood are good.  Pt planning on retiring.  Since making the decision/notifying work stress has decreased.  Pt's L shoulder improving s/p Reverse prosthetic total arthroplasty 10/17/23.  Pt has been released from PT and working on exercises at home.  ROS: See pertinent positives and negatives per HPI.  Past Medical History:  Diagnosis Date   AICD (automatic cardioverter/defibrillator) present    Arthritis    Atrial fibrillation (HCC)    Cardioembolic stroke (HCC) 05/19/2015   CHF (congestive heart failure) (HCC)    Chronic anticoagulation 09/15/2015   COVID    Esophageal reflux 09/15/2015   H/O exercise stress test 02/2013   a. ETT-Echo with normal images but significant ST depression and hypotensive response to exercise   Heart murmur    Hx of cardiac catheterization    LHC 03/09/13:  Smooth and normal coronary arteries, normal LVF   Hyperlipidemia    LVH (left ventricular hypertrophy)    a. Echo 02/2013:  Mild LVH, EF 55-65%, normal wall motion, mild LAE, mild RVE, mild RAE    MVP (mitral valve prolapse)     Past Surgical History:  Procedure Laterality Date   BIV ICD INSERTION CRT-D N/A 04/17/2021    Procedure: BIV ICD INSERTION CRT-D;  Surgeon: Marinus Maw, MD;  [facility] INVASIVE CV LAB;  Service: Cardiovascular;  Laterality: N/A;   KNEE SURGERY     left shoulder surgery      REVERSE SHOULDER ARTHROPLASTY Left 10/17/2023   Procedure: Left anatomic total shoulder arthroplasty versus Reverse arthroplasty;  Surgeon: Francena Hanly, MD;  Location: [facility];  Service: Orthopedics;  Laterality: Left;    US ECHOCARDIOGRAPHY  09/01/2008   EF 55-60%   US ECHOCARDIOGRAPHY  08/17/2005   EF 55-60%    Family History  Adopted: Yes     Current Outpatient Medications:    acetaminophen (TYLENOL) 500 MG tablet, Take 500 mg by mouth every 6 (six) hours as needed for moderate pain (pain score 4-6)., Disp: , Rfl:    colchicine 0.6 MG tablet, TAKE 2 TABS AT FIRST FIGN OF GOUT, MAY REPEAT 1 TAB IN AN HOUR. TAKE DAILY AFTER FOR UP TO 10 DAYS (Patient taking differently: Take 0.6 mg by mouth daily as needed (gout flare).), Disp: 30 tablet, Rfl: 1   cyclobenzaprine (FLEXERIL) 10 MG tablet, Take 1 tablet (10 mg total) by mouth 3 (three) times daily as needed for muscle spasms., Disp: 30 tablet, Rfl: 1   ezetimibe (ZETIA) 10 MG tablet, TAKE 1 TABLET (10 MG TOTAL) BY MOUTH DAILY. PATIENT NEEDS TO KEEP APPOINTMENT IN APRIL FOR ANY FUTURE REFILLS. (Patient taking differently: Take 10 mg by mouth every evening. Patient needs to keep appointment  in April for any future refills.), Disp: 30 tablet, Rfl: 0   fish oil-omega-3 fatty acids 1000 MG capsule, Take 1 g by mouth in the morning., Disp: , Rfl:    losartan (COZAAR) 25 MG tablet, TAKE 1 TABLET (25 MG TOTAL) BY MOUTH DAILY., Disp: 90 tablet, Rfl: 3   metoprolol succinate (TOPROL-XL) 25 MG 24 hr tablet, TAKE 1 TABLET (25 MG TOTAL) BY MOUTH DAILY. (Patient taking differently: Take 25 mg by mouth in the morning.), Disp: 90 tablet, Rfl: 3   metoprolol succinate (TOPROL-XL) 50 MG 24 hr tablet, TAKE 1 TABLET BY MOUTH EVERY DAY, Disp: 90 tablet, Rfl: 2   Multiple  Vitamin (MULTIVITAMIN) tablet, Take 1 tablet by mouth in the morning., Disp: , Rfl:    ondansetron (ZOFRAN) 4 MG tablet, Take 1 tablet (4 mg total) by mouth every 8 (eight) hours as needed for nausea or vomiting., Disp: 10 tablet, Rfl: 0   oxyCODONE-acetaminophen (PERCOCET) 5-325 MG tablet, Take 1 tablet by mouth every 4 (four) hours as needed (max 6 daily)., Disp: 20 tablet, Rfl: 0   rivaroxaban (XARELTO) 20 MG TABS tablet, Take 1 tablet (20 mg total) by mouth daily with supper., Disp: 90 tablet, Rfl: 1   rosuvastatin (CRESTOR) 40 MG tablet, Take 1 tablet (40 mg total) by mouth daily. (Patient taking differently: Take 40 mg by mouth every evening.), Disp: 90 tablet, Rfl: 3   venlafaxine XR (EFFEXOR-XR) 75 MG 24 hr capsule, TAKE 1 CAPSULE BY MOUTH EVERY DAY, Disp: 90 capsule, Rfl: 0  EXAM:  VITALS per patient if applicable: RR between 12-20 bpm  GENERAL: alert, oriented, appears well and in no acute distress  HEENT: atraumatic, conjunctiva clear, no obvious abnormalities on inspection of external nose and ears  NECK: normal movements of the head and neck  LUNGS: on inspection no signs of respiratory distress, breathing rate appears normal, no obvious gross SOB, gasping or wheezing  CV: no obvious cyanosis  MS: moves all visible extremities without noticeable abnormality  PSYCH/NEURO: pleasant and cooperative, no obvious depression or anxiety, speech and thought processing grossly intact    02/19/2024   10:58 AM 07/31/2023    3:28 PM 06/06/2022    2:28 PM  Depression screen PHQ 2/9  Decreased Interest 0 0 0  Down, Depressed, Hopeless 0 0 0  PHQ - 2 Score 0 0 0  Altered sleeping  0 0  Tired, decreased energy  1 0  Change in appetite  0 0  Feeling bad or failure about yourself   0 0  Trouble concentrating  0 0  Moving slowly or fidgety/restless  0 0  Suicidal thoughts  0 0  PHQ-9 Score  1 0  Difficult doing work/chores  Not difficult at all       02/19/2024   10:58 AM 07/31/2023     3:28 PM 06/06/2022    3:14 PM  GAD 7 : Generalized Anxiety Score  Nervous, Anxious, on Edge 1 1 1   Control/stop worrying 0 1 1  Worry too much - different things 0 1 0  Trouble relaxing 0 0 1  Restless 0 0 0  Easily annoyed or irritable 0 0 0  Afraid - awful might happen 0 0 0  Total GAD 7 Score 1 3 3   Anxiety Difficulty  Not difficult at all Not difficult at all     ASSESSMENT AND PLAN:  Discussed the following assessment and plan:  Anxiety - Plan: venlafaxine XR (EFFEXOR-XR) 75 MG 24 hr capsule  History of migraine - Plan: venlafaxine XR (EFFEXOR-XR) 75 MG 24 hr capsule  Pt stable x yrs on Effexor XR 75 mg daily for h/o anxiety and migraines.  GAD 7 score 1, PHQ 9 score 0.  Continue lifestyle modifications.  Effexor refilled.  F/u prn.   I discussed the assessment and treatment plan with the patient. The patient was provided an opportunity to ask questions and all were answered. The patient agreed with the plan and demonstrated an understanding of the instructions.   The patient was advised to call back or seek an in-person evaluation if the symptoms worsen or if the condition fails to improve as anticipated.   Deeann Saint, MD

## 2024-02-19 NOTE — Progress Notes (Signed)
 Patient was unable to self-report due to a lack of equipment at home via telehealth

## 2024-02-20 NOTE — Progress Notes (Signed)
 Remote ICD transmission.

## 2024-03-09 ENCOUNTER — Encounter: Payer: Self-pay | Admitting: Cardiovascular Disease

## 2024-03-09 NOTE — Progress Notes (Unsigned)
 Dennis Cortez Date of Birth  [birth year]       [facility] Office     [address]   [place], [place]  [address]    [phone]       Fax  [phone]      Problem List: 1. Atrial Fibrillation - TIA  2. Hyperlipidemia   [name] is doing well.  Asymptomatic.  His last echo was 3 years ago.  He is working out at [employer] on a regular basis.  He has never had any symptoms related to his A-Fib.  His CHADS score is 0  March 29, 2014:  Dennis Cortez is doing well.  Not working out as much as he would like . Doing well from a cardiac standpoint.  No CP or dyspnea.     July 30, 2014: Dennis Cortez is seen today for followup of his atrial fibrillation. He's been maintained on aspirin because of his low chads Vasc score.  He presented several months after I saw him with some visual disturbances and headache  and was found stroke. He was started on Xarelto at that time.   He feels ok but is still having some headaches.    Sept. 19, 2016:    Doing well.  Has been walking.     No CP or dyspnea.    Oct. 4, 2017:  Dennis Cortez is seen today for follow-up of his atrial fibrillation and LBBB. Tough year. His mother died this past year. managment changes at work   Nov. 21, 2018: Dennis Cortez is seen today for a follow-up of his chronic atrial fibrillation and left bundle branch block. Echocardiogram in March, 2014 showed normal left ventricular systolic function. Has had Afib for 20+ years.   We have not tried cardioversion.   We discussed the fact that cardioversion would likely not be successful Work has been stressful.   Nov. 22, 2019:  Dennis Cortez is seen today for follow-up of his atrial fibrillation and left bundle branch block. Staying active .   Working out some .   Chol and LDL are elevated.    LDL was 122 on Crestor 10 mg a day   December 24, 2018: Is seen back today for a follow-up of his chronic atrial fibrillation left bundle branch block.  Because of his chronic atrial fibrillation  and conduction abnormality, we performed a cardiac MRI following his last office visit.  His left ventricular systolic function is mildly depressed with an ejection fraction of 49%.  No evidence of scarring.  There is no suggestion of sarcoidosis.  Feb. 14, 2022: Dennis Cortez is seen today for follow up visit.   Hx of Atrial fib, LBBB, mildly reduced LV EF .  Cardiac MRI showed no LV scarring , no sarcoidosis, no amyloidosis,  EF = 49%.  Its been 2 years since [place] seen him. Saw Dennis Cortez last year Had Covid in Feb. 2021    ( likely Delta or original strain )  Is trying to exercise    March 30, 2022 Dennis Cortez is seen today for follow up of his Atrial fib, LBBB, he had a cardiac MRI in the past.  Showed no LV scarring, no sarcoidosis, no amyloidosis.  Ejection fraction was 49%.  Echocardiogram from May, 2022 showed marked depression of his left ventricular systolic function to 30 to 35%.  He was referred to Dr. Ladona Ridgel for biventricular pacing/ICD.  Had a pacer ICD placed in May, 2022 Playing golf,    More energy now .  Able to walk much better now.    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     colchicine 0.6 MG tablet TAKE 2 TABS AT FIRST FIGN OF GOUT, MAY REPEAT 1 TAB IN AN HOUR. TAKE DAILY AFTER FOR UP TO 10 DAYS (Patient taking differently: Take 0.6 mg by mouth daily as needed (gout flare).) 30 tablet 1   ezetimibe (ZETIA) 10 MG tablet TAKE 1 TABLET (10 MG TOTAL) BY MOUTH DAILY. PATIENT NEEDS TO KEEP APPOINTMENT IN APRIL FOR ANY FUTURE REFILLS. (Patient taking differently: Take 10 mg by  mouth every evening. Patient needs to keep appointment in April for any future refills.) 30 tablet 0   fish oil-omega-3 fatty acids 1000 MG capsule Take 1 g by mouth in the morning.     losartan (COZAAR) 25 MG tablet TAKE 1 TABLET (25 MG TOTAL) BY MOUTH DAILY. 90 tablet 3   metoprolol succinate (TOPROL-XL) 25 MG 24 hr tablet TAKE 1 TABLET (25 MG TOTAL) BY MOUTH DAILY. (Patient taking differently: Take 25 mg by mouth in the morning.) 90 tablet 3   metoprolol succinate (TOPROL-XL) 50 MG 24 hr tablet TAKE 1 TABLET BY MOUTH EVERY DAY 90 tablet 2   Multiple Vitamin (MULTIVITAMIN) tablet Take 1 tablet by mouth in the morning.     rivaroxaban (XARELTO) 20 MG TABS tablet Take 1 tablet (20 mg total) by mouth daily with supper. 90 tablet 1   rosuvastatin (CRESTOR) 40 MG tablet Take 1 tablet (40 mg total) by mouth daily. (Patient taking differently: Take 40 mg by mouth every evening.) 90 tablet 3   TOLAK 4 % CREA Apply 4 % topically daily. Daily as directed     venlafaxine XR (EFFEXOR-XR) 75 MG 24 hr capsule Take 1 capsule (75 mg total) by mouth daily. 90 capsule 3   No current facility-administered medications on file prior to visit.    Allergies  Allergen Reactions   Benzoyl Peroxide Rash    Past Medical History:  Diagnosis Date   AICD (automatic cardioverter/defibrillator) present    Arthritis    Atrial fibrillation (HCC)    Cardioembolic stroke (HCC) 05/19/2015   CHF (congestive heart failure) (HCC)    Chronic anticoagulation 09/15/2015   COVID    Esophageal reflux 09/15/2015   H/O exercise stress test 02/2013   a. ETT-Echo with normal images but significant ST depression and hypotensive response to exercise   Heart murmur    Hx of cardiac catheterization    LHC 03/09/13:  Smooth and normal coronary arteries, normal LVF   Hyperlipidemia    LVH (left ventricular hypertrophy)    a. Echo 02/2013:  Mild LVH, EF 55-65%, normal wall motion, mild LAE, mild RVE, mild RAE    MVP (mitral valve  prolapse)     Past Surgical History:  Procedure Laterality Date   BIV ICD INSERTION CRT-D N/A 04/17/2021   Procedure: BIV ICD INSERTION CRT-D;  Surgeon: Marinus Maw, MD;  Location: [facility] INVASIVE CV LAB;  Service:  Cardiovascular;  Laterality: N/A;   KNEE SURGERY     left shoulder surgery      REVERSE SHOULDER ARTHROPLASTY Left 10/17/2023   Procedure: Left anatomic total shoulder arthroplasty versus Reverse arthroplasty;  Surgeon: Francena Hanly, MD;  Location: [facility];  Service: Orthopedics;  Laterality: Left;    US ECHOCARDIOGRAPHY  09/01/2008   EF 55-60%   US ECHOCARDIOGRAPHY  08/17/2005   EF 55-60%    Social History   Tobacco Use  Smoking Status Never  Smokeless Tobacco Never    Social History   Substance and Sexual Activity  Alcohol Use Yes   Comment: rare    Family History  Adopted: Yes    Reviw of Systems:  Reviewed in the HPI.  All other systems are negative.    Physical Exam: Blood pressure 118/82, pulse 70, height 6\' 1"  (1.854 m), weight 216 lb 9.6 oz (98.2 kg), SpO2 98%.       GEN:  Well nourished, well developed in no acute distress HEENT: Normal NECK: No JVD; No carotid bruits LYMPHATICS: No lymphadenopathy CARDIAC: RRR   RESPIRATORY:  Clear to auscultation without rales, wheezing or rhonchi  ABDOMEN: Soft, non-tender, non-distended MUSCULOSKELETAL:  No edema; No deformity  SKIN: Warm and dry NEUROLOGIC:  Alert and oriented x 3     ECG:      Assessment / Plan:   1. Atrial Fibrillation  -  s/p pacer   2. Hyperlipidemia - .    3. CVA:    .     4.  Mildly reduced left ventricular systolic function.   S/p Bi-V pacer  Continue to follow up with EP       Kristeen Miss, MD  03/10/2024 1:55 PM    [facility] [address] [place],  [address] [place], [place]  [address] Pager [phone] Phone: [phone]; Fax: [phone]

## 2024-03-10 ENCOUNTER — Ambulatory Visit: Attending: Cardiovascular Disease | Admitting: Cardiovascular Disease

## 2024-03-10 VITALS — BP 118/82 | HR 70 | Ht 73.0 in | Wt 216.6 lb

## 2024-03-10 DIAGNOSIS — I447 Left bundle-branch block, unspecified: Secondary | ICD-10-CM | POA: Diagnosis not present

## 2024-03-10 DIAGNOSIS — I5022 Chronic systolic (congestive) heart failure: Secondary | ICD-10-CM | POA: Diagnosis not present

## 2024-03-10 NOTE — Patient Instructions (Signed)
 Follow-Up: At [facility], you and your health needs are our priority.  As part of our continuing mission to provide you with exceptional heart care, our providers are all part of one team.  This team includes your primary Cardiologist (physician) and Advanced Practice Providers or APPs (Physician Assistants and Nurse Practitioners) who all work together to provide you with the care you need, when you need it.  Your next appointment:   1 year(s)  Provider:   Riley Lam, MD    1st Floor: - Lobby - Registration  - Pharmacy  - Lab - Cafe  2nd Floor: - PV Lab - Diagnostic Testing (echo, CT, nuclear med)  3rd Floor: - Vacant  4th Floor: - TCTS (cardiothoracic surgery) - AFib Clinic - Structural Heart Clinic - Vascular Surgery  - Vascular Ultrasound  5th Floor: - HeartCare Cardiology (general and EP) - Clinical Pharmacy for coumadin, hypertension, lipid, weight-loss medications, and med management appointments    Valet parking services will be available as well.

## 2024-04-14 ENCOUNTER — Ambulatory Visit (INDEPENDENT_AMBULATORY_CARE_PROVIDER_SITE_OTHER): Payer: BC Managed Care – PPO

## 2024-04-14 DIAGNOSIS — I447 Left bundle-branch block, unspecified: Secondary | ICD-10-CM

## 2024-04-14 LAB — CUP PACEART REMOTE DEVICE CHECK
Battery Remaining Longevity: 71 mo
Battery Remaining Percentage: 67 %
Battery Voltage: 2.98 V
Brady Statistic RA Percent Paced: 92 %
Date Time Interrogation Session: 20250506030035
HighPow Impedance: 70 Ohm
Implantable Lead Connection Status: 753985
Implantable Lead Connection Status: 753985
Implantable Lead Connection Status: 753985
Implantable Lead Implant Date: 20220509
Implantable Lead Implant Date: 20220509
Implantable Lead Implant Date: 20220509
Implantable Lead Location: 753858
Implantable Lead Location: 753860
Implantable Lead Location: 753860
Implantable Lead Model: 3830
Implantable Pulse Generator Implant Date: 20220509
Lead Channel Impedance Value: 530 Ohm
Lead Channel Impedance Value: 600 Ohm
Lead Channel Pacing Threshold Amplitude: 0.75 V
Lead Channel Pacing Threshold Pulse Width: 0.4 ms
Lead Channel Sensing Intrinsic Amplitude: 12 mV
Lead Channel Sensing Intrinsic Amplitude: 5 mV
Lead Channel Setting Pacing Amplitude: 2.5 V
Lead Channel Setting Sensing Sensitivity: 0.5 mV
Pulse Gen Serial Number: 810027397
Zone Setting Status: 755011

## 2024-04-15 ENCOUNTER — Ambulatory Visit: Admitting: Family Medicine

## 2024-04-15 ENCOUNTER — Encounter: Payer: Self-pay | Admitting: Family Medicine

## 2024-04-15 ENCOUNTER — Encounter: Payer: Self-pay | Admitting: Internal Medicine

## 2024-04-15 VITALS — BP 132/80 | HR 69 | Temp 98.7°F | Ht 73.0 in | Wt 218.8 lb

## 2024-04-15 DIAGNOSIS — Z95810 Presence of automatic (implantable) cardiac defibrillator: Secondary | ICD-10-CM

## 2024-04-15 DIAGNOSIS — F439 Reaction to severe stress, unspecified: Secondary | ICD-10-CM | POA: Diagnosis not present

## 2024-04-15 DIAGNOSIS — R350 Frequency of micturition: Secondary | ICD-10-CM

## 2024-04-15 DIAGNOSIS — I482 Chronic atrial fibrillation, unspecified: Secondary | ICD-10-CM

## 2024-04-15 DIAGNOSIS — R55 Syncope and collapse: Secondary | ICD-10-CM | POA: Diagnosis not present

## 2024-04-15 LAB — POCT GLYCOSYLATED HEMOGLOBIN (HGB A1C): Hemoglobin A1C: 5.1 % (ref 4.0–5.6)

## 2024-04-15 NOTE — Patient Instructions (Signed)
 Your hemoglobin A1c was 5.1% this visit which is in the normal range.  This is typically where your A1c has been.  This is a reading that gives you an average of your blood sugar over the last 3 months.

## 2024-04-15 NOTE — Progress Notes (Signed)
 Established Patient Office Visit   Subjective  Patient ID: Dennis Cortez, male    DOB: [birth year]  Age: 58 y.o. MRN: [id number]  Chief Complaint  Patient presents with   Hypotension    Low Bp     Pt is a 58 yo male seen for acute concern.  Pt had an episode of feeling lightheaded, bandlike headache, and a heavy sensation in/on his body causing increased anxiety.  His wife gave him ginger ale, but he felt like the glass was heavy.  After laying down for an hour or 2 felt better.  Upon waking up BP was 118/59.  Denies CP, changes in vision.  Had a sandwich for dinner the night before and a cup of coffee that morning.  Patient has similar episode after having surgery in November 2024.  Patient drinking ten 20 ounce bottles of water  daily.  Notes increased urination.  Eating "more sweets" such as [place] yogurt with granola and special K cereal.  A few days prior to episode patient notes having increased A-fib.  Biventricular ICD in place.  Compliant with Xarelto .  Notes more floaters in vision/"increased peripheral vision".  Near vision decreasing.  Patient has yearly eye exams.  Has a white line in vision at baseline which has been worked up.  Patient was using an topical medication to treat skin lesions from dermatology which caused erythema.   Patient Active Problem List   Diagnosis Date Noted   Biventricular ICD (implantable cardioverter-defibrillator) in place 07/21/2021   Left bundle branch block 03/15/2021   Chronic systolic heart failure (HCC) 01/23/2021   FHx: melanoma 09/18/2018   Chronic anticoagulation 09/15/2015   Esophageal reflux 09/15/2015   Migraine without aura and without status migrainosus, not intractable 05/19/2015   Cardioembolic stroke (HCC) 05/19/2015   Generalized anxiety disorder 05/19/2015   ATRIAL FIBRILLATION  01/06/2008   Hyperlipemia 10/22/2007   Mitral valve disease 10/22/2007   Past Medical History:  Diagnosis Date   AICD (automatic  cardioverter/defibrillator) present    Arthritis    Atrial fibrillation (HCC)    Cardioembolic stroke (HCC) 05/19/2015   CHF (congestive heart failure) (HCC)    Chronic anticoagulation 09/15/2015   COVID    Esophageal reflux 09/15/2015   H/O exercise stress test 02/2013   a. ETT-Echo with normal images but significant ST depression and hypotensive response to exercise   Heart murmur    Hx of cardiac catheterization    LHC 03/09/13:  Smooth and normal coronary arteries, normal LVF   Hyperlipidemia    LVH (left ventricular hypertrophy)    a. Echo 02/2013:  Mild LVH, EF 55-65%, normal wall motion, mild LAE, mild RVE, mild RAE    MVP (mitral valve prolapse)    Past Surgical History:  Procedure Laterality Date   BIV ICD INSERTION CRT-D N/A 04/17/2021   Procedure: BIV ICD INSERTION CRT-D;  Surgeon: Tammie Fall, MD;  Location: [facility] INVASIVE CV LAB;  Service: Cardiovascular;  Laterality: N/A;   KNEE SURGERY     left shoulder surgery      REVERSE SHOULDER ARTHROPLASTY Left 10/17/2023   Procedure: Left anatomic total shoulder arthroplasty versus Reverse arthroplasty;  Surgeon: Ellard Gunning, MD;  Location: [facility];  Service: Orthopedics;  Laterality: Left;    US  ECHOCARDIOGRAPHY  09/01/2008   EF 55-60%   US  ECHOCARDIOGRAPHY  08/17/2005   EF 55-60%   Social History   Tobacco Use   Smoking status: Never   Smokeless tobacco: Never  Vaping Use  Vaping status: Never Used  Substance Use Topics   Alcohol use: Yes    Comment: rare   Drug use: No   Family History  Adopted: Yes   Allergies  Allergen Reactions   Benzoyl Peroxide Rash      ROS Negative unless stated above    Objective:     BP 132/80 (BP Location: Left Arm, Patient Position: Sitting, Cuff Size: Normal)   Pulse 69   Temp 98.7 F (37.1 C) (Oral)   Ht 6\' 1"  (1.854 m)   Wt 218 lb 12.8 oz (99.2 kg)   SpO2 97%   BMI 28.87 kg/m  BP Readings from Last 3 Encounters:  04/15/24 132/80  03/10/24 118/82   10/17/23 122/67   Wt Readings from Last 3 Encounters:  04/15/24 218 lb 12.8 oz (99.2 kg)  03/10/24 216 lb 9.6 oz (98.2 kg)  10/17/23 213 lb 10 oz (96.9 kg)     Physical Exam Constitutional:      General: He is not in acute distress.    Appearance: Normal appearance.  HENT:     Head: Normocephalic and atraumatic.     Nose: Nose normal.     Mouth/Throat:     Mouth: Mucous membranes are moist.  Eyes:     Extraocular Movements: Extraocular movements intact.     Conjunctiva/sclera: Conjunctivae normal.     Pupils: Pupils are equal, round, and reactive to light.  Neck:     Vascular: No carotid bruit.  Cardiovascular:     Rate and Rhythm: Normal rate and regular rhythm.     Heart sounds: Normal heart sounds. No murmur heard.    No gallop.  Pulmonary:     Effort: Pulmonary effort is normal. No respiratory distress.     Breath sounds: Normal breath sounds. No wheezing, rhonchi or rales.  Skin:    General: Skin is warm and dry.     Comments: Erythema of forehead, cheeks.  Neurological:     Mental Status: He is alert and oriented to person, place, and time.        04/15/2024   12:18 PM 02/19/2024   10:58 AM 07/31/2023    3:28 PM  Depression screen PHQ 2/9  Decreased Interest 0 0 0  Down, Depressed, Hopeless 0 0 0  PHQ - 2 Score 0 0 0  Altered sleeping 3  0  Tired, decreased energy 2  1  Change in appetite 0  0  Feeling bad or failure about yourself  0  0  Trouble concentrating 1  0  Moving slowly or fidgety/restless 2  0  Suicidal thoughts 0  0  PHQ-9 Score 8  1  Difficult doing work/chores   Not difficult at all      04/15/2024   12:18 PM 02/19/2024   10:58 AM 07/31/2023    3:28 PM 06/06/2022    3:14 PM  GAD 7 : Generalized Anxiety Score  Nervous, Anxious, on Edge 1 1 1 1   Control/stop worrying 1 0 1 1  Worry too much - different things 1 0 1 0  Trouble relaxing 2 0 0 1  Restless 1 0 0 0  Easily annoyed or irritable 1 0 0 0  Afraid - awful might happen 1 0 0 0   Total GAD 7 Score 8 1 3 3   Anxiety Difficulty Not difficult at all  Not difficult at all Not difficult at all     Results for orders placed or performed in visit on 04/15/24  POC HgB A1c  Result Value Ref Range   Hemoglobin A1C 5.1 4.0 - 5.6 %   HbA1c POC (<> result, manual entry)     HbA1c, POC (prediabetic range)     HbA1c, POC (controlled diabetic range)        Assessment & Plan:  Near syncope -     POCT glycosylated hemoglobin (Hb A1C)  Stress  Urinary frequency -     POCT glycosylated hemoglobin (Hb A1C)  Chronic atrial fibrillation (HCC)  Biventricular ICD (implantable cardioverter-defibrillator) in place  Episode of feeling heavy/presyncopal improved with laying down and beverage.  Concern for hypoglycemia, hypotension.  Also consider arrhythmia however reassuring ICD in place.  POC A1c 5.1% this visit.  Orthostatics obtained and normal.  Concerned increased stress associated with retiring causing symptoms.  Discussed the importance of self-care including exercise such as walking and playing guitar.  For continued/increased frequency of episodes obtain CT head due to prior history of CVA, though compliant with Xarelto .  Return if symptoms worsen or fail to improve.   Viola Greulich, MD

## 2024-05-28 NOTE — Progress Notes (Signed)
 Remote ICD transmission.

## 2024-06-01 ENCOUNTER — Other Ambulatory Visit: Payer: Self-pay | Admitting: Nurse Practitioner

## 2024-06-14 ENCOUNTER — Other Ambulatory Visit: Payer: Self-pay | Admitting: Internal Medicine

## 2024-06-14 DIAGNOSIS — I482 Chronic atrial fibrillation, unspecified: Secondary | ICD-10-CM

## 2024-06-16 NOTE — Telephone Encounter (Signed)
 Prescription refill request for Xarelto  received.  Indication: a fib Last office visit: 03/10/24 Weight: 218# Age: 58 Scr: 0.74 epic 10/08/23 CrCl: 150 ml/min

## 2024-06-29 ENCOUNTER — Other Ambulatory Visit: Payer: Self-pay

## 2024-06-29 DIAGNOSIS — I482 Chronic atrial fibrillation, unspecified: Secondary | ICD-10-CM

## 2024-06-29 MED ORDER — LOSARTAN POTASSIUM 25 MG PO TABS: 25.0000 mg | ORAL_TABLET | Freq: Every day | ORAL | 2 refills | Status: AC

## 2024-06-29 MED ORDER — METOPROLOL SUCCINATE ER 25 MG PO TB24: 25.0000 mg | ORAL_TABLET | Freq: Every day | ORAL | 2 refills | Status: AC

## 2024-06-29 NOTE — Telephone Encounter (Signed)
 RX sent to requested Pharmacy. RX for Xarelto  routed to [facility]

## 2024-06-30 MED ORDER — RIVAROXABAN 20 MG PO TABS: 20.0000 mg | ORAL_TABLET | Freq: Every day | ORAL | 1 refills | Status: AC

## 2024-06-30 NOTE — Telephone Encounter (Signed)
 Prescription refill request for Xarelto  received.  Indication:AFIB Last office visit:4/25 Weight:99.2  kg Age:58 Scr:0.74  10/24 CrCl:150.81  ml/min  Prescription refilled

## 2024-07-14 ENCOUNTER — Ambulatory Visit (INDEPENDENT_AMBULATORY_CARE_PROVIDER_SITE_OTHER): Payer: BC Managed Care – PPO

## 2024-07-14 DIAGNOSIS — I447 Left bundle-branch block, unspecified: Secondary | ICD-10-CM

## 2024-07-15 LAB — CUP PACEART REMOTE DEVICE CHECK
Battery Remaining Longevity: 68 mo
Battery Remaining Percentage: 65 %
Battery Voltage: 2.98 V
Brady Statistic RA Percent Paced: 93 %
Date Time Interrogation Session: 20250805020313
HighPow Impedance: 68 Ohm
Implantable Lead Connection Status: 753985
Implantable Lead Connection Status: 753985
Implantable Lead Connection Status: 753985
Implantable Lead Implant Date: 20220509
Implantable Lead Implant Date: 20220509
Implantable Lead Implant Date: 20220509
Implantable Lead Location: 753858
Implantable Lead Location: 753860
Implantable Lead Location: 753860
Implantable Lead Model: 3830
Implantable Pulse Generator Implant Date: 20220509
Lead Channel Impedance Value: 490 Ohm
Lead Channel Impedance Value: 560 Ohm
Lead Channel Pacing Threshold Amplitude: 0.75 V
Lead Channel Pacing Threshold Pulse Width: 0.4 ms
Lead Channel Sensing Intrinsic Amplitude: 12 mV
Lead Channel Sensing Intrinsic Amplitude: 5 mV
Lead Channel Setting Pacing Amplitude: 2.5 V
Lead Channel Setting Sensing Sensitivity: 0.5 mV
Pulse Gen Serial Number: 810027397
Zone Setting Status: 755011

## 2024-07-16 ENCOUNTER — Ambulatory Visit: Payer: Self-pay | Admitting: Internal Medicine

## 2024-09-01 ENCOUNTER — Other Ambulatory Visit: Payer: Self-pay | Admitting: Internal Medicine

## 2024-09-01 DIAGNOSIS — I482 Chronic atrial fibrillation, unspecified: Secondary | ICD-10-CM

## 2024-09-01 DIAGNOSIS — I5022 Chronic systolic (congestive) heart failure: Secondary | ICD-10-CM

## 2024-09-07 NOTE — Progress Notes (Signed)
 Remote ICD Transmission

## 2024-10-13 ENCOUNTER — Ambulatory Visit: Payer: BC Managed Care – PPO

## 2024-10-13 DIAGNOSIS — I5022 Chronic systolic (congestive) heart failure: Secondary | ICD-10-CM

## 2024-10-13 LAB — CUP PACEART REMOTE DEVICE CHECK
Battery Remaining Longevity: 66 mo
Battery Remaining Percentage: 63 %
Battery Voltage: 2.98 V
Brady Statistic RA Percent Paced: 94 %
Date Time Interrogation Session: 20251104022404
HighPow Impedance: 71 Ohm
Implantable Lead Connection Status: 753985
Implantable Lead Connection Status: 753985
Implantable Lead Connection Status: 753985
Implantable Lead Implant Date: 20220509
Implantable Lead Implant Date: 20220509
Implantable Lead Implant Date: 20220509
Implantable Lead Location: 753858
Implantable Lead Location: 753860
Implantable Lead Location: 753860
Implantable Lead Model: 3830
Implantable Pulse Generator Implant Date: 20220509
Lead Channel Impedance Value: 500 Ohm
Lead Channel Impedance Value: 600 Ohm
Lead Channel Pacing Threshold Amplitude: 0.75 V
Lead Channel Pacing Threshold Pulse Width: 0.4 ms
Lead Channel Sensing Intrinsic Amplitude: 12 mV
Lead Channel Sensing Intrinsic Amplitude: 5 mV
Lead Channel Setting Pacing Amplitude: 2.5 V
Lead Channel Setting Sensing Sensitivity: 0.5 mV
Pulse Gen Serial Number: 810027397
Zone Setting Status: 755011

## 2024-10-16 ENCOUNTER — Ambulatory Visit: Payer: Self-pay | Admitting: Internal Medicine

## 2024-10-16 NOTE — Progress Notes (Signed)
 Remote ICD Transmission

## 2024-11-28 ENCOUNTER — Other Ambulatory Visit: Payer: Self-pay | Admitting: Internal Medicine

## 2024-11-28 DIAGNOSIS — I482 Chronic atrial fibrillation, unspecified: Secondary | ICD-10-CM

## 2024-11-28 DIAGNOSIS — I5022 Chronic systolic (congestive) heart failure: Secondary | ICD-10-CM

## 2025-01-12 ENCOUNTER — Ambulatory Visit: Payer: BC Managed Care – PPO

## 2025-01-13 LAB — CUP PACEART REMOTE DEVICE CHECK
Battery Remaining Longevity: 62 mo
Battery Remaining Percentage: 60 %
Battery Voltage: 2.98 V
Brady Statistic RA Percent Paced: 95 %
Date Time Interrogation Session: 20260203020012
HighPow Impedance: 69 Ohm
Implantable Lead Connection Status: 753985
Implantable Lead Connection Status: 753985
Implantable Lead Connection Status: 753985
Implantable Lead Implant Date: 20220509
Implantable Lead Implant Date: 20220509
Implantable Lead Implant Date: 20220509
Implantable Lead Location: 753858
Implantable Lead Location: 753860
Implantable Lead Location: 753860
Implantable Lead Model: 3830
Implantable Pulse Generator Implant Date: 20220509
Lead Channel Impedance Value: 500 Ohm
Lead Channel Impedance Value: 580 Ohm
Lead Channel Pacing Threshold Amplitude: 0.75 V
Lead Channel Pacing Threshold Pulse Width: 0.4 ms
Lead Channel Sensing Intrinsic Amplitude: 12 mV
Lead Channel Sensing Intrinsic Amplitude: 5 mV
Lead Channel Setting Pacing Amplitude: 2.5 V
Lead Channel Setting Sensing Sensitivity: 0.5 mV
Pulse Gen Serial Number: 810027397
Zone Setting Status: 755011

## 2025-02-11 ENCOUNTER — Ambulatory Visit: Admitting: Cardiology

## 2025-03-10 ENCOUNTER — Ambulatory Visit: Admitting: Internal Medicine

## 2025-04-13 ENCOUNTER — Ambulatory Visit: Payer: BC Managed Care – PPO

## 2025-07-13 ENCOUNTER — Ambulatory Visit: Payer: BC Managed Care – PPO

## 2025-10-12 ENCOUNTER — Ambulatory Visit: Payer: BC Managed Care – PPO

## 2026-01-11 ENCOUNTER — Ambulatory Visit: Payer: BC Managed Care – PPO

## 2026-04-12 ENCOUNTER — Ambulatory Visit: Payer: BC Managed Care – PPO
